# Patient Record
Sex: Male | Born: 1935 | Race: White | Hispanic: No | Marital: Married | Smoking: Former smoker
Health system: Southern US, Community
[De-identification: ages and names within clinical notes are randomized; demographics above are authoritative.]

## PROBLEM LIST (undated history)

## (undated) DIAGNOSIS — M1990 Unspecified osteoarthritis, unspecified site: Secondary | ICD-10-CM

## (undated) DIAGNOSIS — E785 Hyperlipidemia, unspecified: Secondary | ICD-10-CM

## (undated) DIAGNOSIS — Z8719 Personal history of other diseases of the digestive system: Secondary | ICD-10-CM

## (undated) DIAGNOSIS — I2510 Atherosclerotic heart disease of native coronary artery without angina pectoris: Secondary | ICD-10-CM

## (undated) DIAGNOSIS — I739 Peripheral vascular disease, unspecified: Secondary | ICD-10-CM

## (undated) DIAGNOSIS — I10 Essential (primary) hypertension: Secondary | ICD-10-CM

## (undated) DIAGNOSIS — K219 Gastro-esophageal reflux disease without esophagitis: Secondary | ICD-10-CM

## (undated) DIAGNOSIS — I719 Aortic aneurysm of unspecified site, without rupture: Secondary | ICD-10-CM

## (undated) HISTORY — DX: Hyperlipidemia, unspecified: E78.5

## (undated) HISTORY — PX: CHOLECYSTECTOMY: SHX55

## (undated) HISTORY — DX: Atherosclerotic heart disease of native coronary artery without angina pectoris: I25.10

## (undated) HISTORY — DX: Gastro-esophageal reflux disease without esophagitis: K21.9

## (undated) HISTORY — PX: HAND SURGERY: SHX662

## (undated) HISTORY — PX: US EXTREMITY*L*: HXRAD785

## (undated) HISTORY — PX: TONSILLECTOMY: SUR1361

## (undated) HISTORY — DX: Essential (primary) hypertension: I10

---

## 1996-11-09 HISTORY — PX: AORTIC VALVE REPLACEMENT: SHX41

## 1996-11-09 HISTORY — PX: CORONARY ARTERY BYPASS GRAFT: SHX141

## 2004-10-14 ENCOUNTER — Ambulatory Visit: Payer: Self-pay | Admitting: *Deleted

## 2007-01-06 ENCOUNTER — Ambulatory Visit: Payer: Self-pay | Admitting: *Deleted

## 2007-01-07 ENCOUNTER — Ambulatory Visit: Payer: Self-pay

## 2007-02-02 ENCOUNTER — Ambulatory Visit: Payer: Self-pay | Admitting: *Deleted

## 2007-09-26 ENCOUNTER — Ambulatory Visit: Payer: Self-pay

## 2007-09-26 ENCOUNTER — Encounter: Payer: Self-pay | Admitting: Cardiovascular Disease

## 2007-09-26 ENCOUNTER — Ambulatory Visit: Payer: Self-pay | Admitting: Cardiovascular Disease

## 2008-04-13 ENCOUNTER — Ambulatory Visit: Payer: Self-pay | Admitting: Cardiovascular Disease

## 2008-10-15 ENCOUNTER — Ambulatory Visit: Payer: Self-pay | Admitting: Cardiovascular Disease

## 2009-07-01 ENCOUNTER — Ambulatory Visit: Payer: Self-pay | Admitting: Cardiovascular Disease

## 2009-07-01 DIAGNOSIS — E785 Hyperlipidemia, unspecified: Secondary | ICD-10-CM

## 2009-07-01 DIAGNOSIS — Z954 Presence of other heart-valve replacement: Secondary | ICD-10-CM | POA: Insufficient documentation

## 2009-07-01 DIAGNOSIS — I10 Essential (primary) hypertension: Secondary | ICD-10-CM

## 2009-07-08 ENCOUNTER — Telehealth: Payer: Self-pay | Admitting: Cardiovascular Disease

## 2009-07-22 ENCOUNTER — Ambulatory Visit: Payer: Self-pay | Admitting: Cardiovascular Disease

## 2009-07-23 ENCOUNTER — Telehealth: Payer: Self-pay | Admitting: Cardiovascular Disease

## 2009-08-12 ENCOUNTER — Ambulatory Visit: Payer: Self-pay | Admitting: Cardiovascular Disease

## 2009-11-29 DIAGNOSIS — I2510 Atherosclerotic heart disease of native coronary artery without angina pectoris: Secondary | ICD-10-CM

## 2009-12-16 ENCOUNTER — Ambulatory Visit: Payer: Self-pay | Admitting: Cardiovascular Disease

## 2010-06-23 ENCOUNTER — Ambulatory Visit: Payer: Self-pay | Admitting: Cardiovascular Disease

## 2010-12-09 NOTE — Assessment & Plan Note (Signed)
Summary: f42m   Visit Type:  6 months follow up Primary Provider:  Dr Ardyth Gal  CC:  Dizziness- ear problems.  History of Present Illness: Clayton [name] is an 77 year old gentleman who underwent St. Jude aortic valve replacement and multivessel coronary bypass surgery in 1999.  He is maintained on Coumadin for anticoagulation of his prosthetic valve.  He is sedentary, but has no symptoms with his normal daily activities. He denies chest pain, dyspnea, orthopnea, or PND. No edema or palpitations.   He is most bothered by progressively worsening vision. He has nearly lost his vision, and questions whether coumadin is causing this. He has been seen by several opthamologists and does not have much hope for improvement at this point.  Current Medications (verified): 1)  Jantoven 5 Mg Tabs (Warfarin Sodium) .... As Directed 2)  Metoprolol Succinate 100 Mg Xr24h-Tab (Metoprolol Succinate) .... Take One Tablet 1 Am and 1/2 Pm 3)  Aspirin 81 Mg Tbec (Aspirin) .... Take One Tablet By Mouth Daily 4)  Pravastatin Sodium 40 Mg Tabs (Pravastatin Sodium) .... Take One Tablet By Mouth Daily At Bedtime 5)  Lisinopril 10 Mg Tabs (Lisinopril) .... Take 1/4 Tablet Once A Day.,sometimes 1/2 6)  Dorzolamide Hcl 2 % Soln (Dorzolamide Hcl) .... One Drop Both Eyes Two Times A Day 7)  Omeprazole 20 Mg Tbec (Omeprazole) .... Take 1 Tablet By Mouth Once A Day 8)  Eye Vitamins  Caps (Multiple Vitamins-Minerals) .... Take 2 Capsules  By Mouth Two Times A Day  Allergies: 1)  ! * Lock Jaw Shot  Past History:  Past medical history reviewed for relevance to current acute and chronic problems.  Past Medical History: Reviewed history from 11/29/2009 and no changes required. Current Problems:  CAD, ARTERY BYPASS GRAFT (ICD-414.04) HYPERTENSION, UNSPECIFIED (ICD-401.9) HYPERLIPIDEMIA (ICD-272.4) AORTIC VALVE REPLACEMENT, HX OF (ICD-V43.3)  Review of Systems       Negative except as per HPI   Vital Signs:  Patient  profile:   77 year old male Height:      68 inches Weight:      203.25 pounds BMI:     31.02 Pulse rate:   55 / minute Pulse rhythm:   irregular Resp:     18 per minute BP sitting:   130 / 68  (right arm) Cuff size:   large  Vitals Entered By: Vikki Ports (December 16, 2009 11:04 AM)  Physical Exam  General:  Pt is alert and oriented, elderly, obese male, in no acute distress. HEENT: normal Neck: normal carotid upstrokes without bruits, JVP normal Lungs: CTA CV: RRR with normal mechanical A2, soft systolic ejection murmur Abd: soft, NT, positive BS, no bruit, no organomegaly Ext: no clubbing, cyanosis, or edema. peripheral pulses 2+ and equal Skin: warm and dry without rash    EKG  Procedure date:  12/16/2009  Findings:      NSR with first degree AVB, HR 55 bpm  Impression & Recommendations:  Problem # 1:  CAD, ARTERY BYPASS GRAFT (ICD-414.04) Stable without angina. Pt is on low-dose ASA in addition to coumadin, as well as a beta blocker with HR less than 60. Continue current Rx.  His updated medication list for this problem includes:    Jantoven 5 Mg Tabs (Warfarin sodium) .Marland Kitchen... As directed    Metoprolol Succinate 100 Mg Xr24h-tab (Metoprolol succinate) .Marland Kitchen... Take one tablet 1 am and 1/2 pm    Aspirin 81 Mg Tbec (Aspirin) .Marland Kitchen... Take one tablet by mouth daily  Lisinopril 10 Mg Tabs (Lisinopril) .Marland Kitchen... Take 1/4 tablet once a day.,sometimes 1/2  Orders: EKG w/ Interpretation (93000)  Problem # 2:  AORTIC VALVE REPLACEMENT, HX OF (ICD-V43.3) Pt on coumadin. I did a literature review and can't find an association between coumadin and visual loss. Pradaxa is not an alternative since it is not approved for mechanical valves. The pt was agreeable to continuation of coumadin.  Orders: EKG w/ Interpretation (93000)  Patient Instructions: 1)  Your physician recommends that you continue on your current medications as directed. Please refer to the Current Medication list  given to you today. 2)  Your physician wants you to follow-up in: 6 MONTHS.  You will receive a reminder letter in the mail two months in advance. If you don't receive a letter, please call our office to schedule the follow-up appointment.

## 2010-12-09 NOTE — Assessment & Plan Note (Signed)
Summary: Clayton [name]   Visit Type:  6 months follow up Primary Provider:  Dr Ardyth Gal  CC:  No complaints.  History of Present Illness: Clayton [name] is an 77 year old gentleman who underwent St. Jude aortic valve replacement and multivessel coronary bypass surgery in 1999.  He is maintained on Coumadin for anticoagulation of his prosthetic valve.  He is very inactive because of progressive vision loss and he now is legally blind. The patient denies chest pain, dyspnea, orthopnea, PND, edema, palpitations, lightheadedness, or syncope.   Current Medications (verified): 1)  Warfarin Sodium 6 Mg Tabs (Warfarin Sodium) .... Use As Directed By Anticoagulation Clinic 6mg  One A Day and 5mg  One Time A Week 2)  Metoprolol Succinate 100 Mg Xr24h-Tab (Metoprolol Succinate) .... Take One Tablet 1 Am and 1/2 Pm 3)  Aspirin 81 Mg Tbec (Aspirin) .... Take One Tablet By Mouth Daily 4)  Lisinopril 10 Mg Tabs (Lisinopril) .... Take 1/4 Tablet Once A Day.,sometimes 1/2 5)  Dorzolamide Hcl 2 % Soln (Dorzolamide Hcl) .... One Drop Both Eyes Two Times A Day 6)  Omeprazole 20 Mg Tbec (Omeprazole) .... Take 1 Tablet By Mouth Once A Day 7)  Eye Vitamins  Caps (Multiple Vitamins-Minerals) .... Take 2 Capsules  By Mouth Two Times A Day  Allergies: 1)  ! * Lock Jaw Shot  Past History:  Past medical history reviewed for relevance to current acute and chronic problems.  Past Medical History: Current Problems:  CAD, ARTERY BYPASS GRAFT (ICD-414.04) 1999 HYPERTENSION, UNSPECIFIED (ICD-401.9) HYPERLIPIDEMIA (ICD-272.4) AORTIC VALVE REPLACEMENT, HX OF (ICD-V43.3) - St Jude AVR  Past Surgical History: Aortic valve replacement Bypass graft x 3 1999 Gallbladder surgery Tonsillectomy  Review of Systems       Negative except as per HPI   Vital Signs:  Patient profile:   77 year old male Height:      68 inches Weight:      202 pounds BMI:     30.83 Pulse rate:   58 / minute Pulse rhythm:   regular Resp:     18 per  minute BP sitting:   124 / 70  (right arm) Cuff size:   large  Vitals Entered By: Vikki Ports (June 23, 2010 12:19 PM)  Physical Exam  General:  Pt is alert and oriented, elderly, obese male, in no acute distress. HEENT: normal Neck: normal carotid upstrokes without bruits, JVP normal Lungs: CTA CV: RRR with normal mechanical A2, soft systolic ejection murmur Abd: soft, NT, positive BS, no bruit, no organomegaly Ext: no clubbing, cyanosis, or edema. peripheral pulses 2+ and equal Skin: warm and dry without rash    EKG  Procedure date:  06/23/2010  Findings:      Sinus bradycardia 58 bpm with 1st degree AVB, otherwise WNL  Impression & Recommendations:  Problem # 1:  CAD, ARTERY BYPASS GRAFT (ICD-414.04)  Stable without angina. Continue current Rx.  His updated medication list for this problem includes:    Warfarin Sodium 6 Mg Tabs (Warfarin sodium) ..... Use as directed by anticoagulation clinic 6mg  one a day and 5mg  one time a week    Metoprolol Succinate 100 Mg Xr24h-tab (Metoprolol succinate) .Marland Kitchen... Take one tablet 1 am and 1/2 pm    Aspirin 81 Mg Tbec (Aspirin) .Marland Kitchen... Take one tablet by mouth daily    Lisinopril 10 Mg Tabs (Lisinopril) .Marland Kitchen... Take 1/4 tablet once a day.,sometimes 1/2  Orders: EKG w/ Interpretation (93000)  Problem # 2:  AORTIC VALVE REPLACEMENT, HX OF (ICD-V43.3)  Continue anticoagulationa with warfarin. Valve stable by exam.  Orders: EKG w/ Interpretation (93000)  Problem # 3:  HYPERTENSION, UNSPECIFIED (ICD-401.9)  Well-controlled on current Rx.  His updated medication list for this problem includes:    Metoprolol Succinate 100 Mg Xr24h-tab (Metoprolol succinate) .Marland Kitchen... Take one tablet 1 am and 1/2 pm    Aspirin 81 Mg Tbec (Aspirin) .Marland Kitchen... Take one tablet by mouth daily    Lisinopril 10 Mg Tabs (Lisinopril) .Marland Kitchen... Take 1/4 tablet once a day.,sometimes 1/2  BP today: 124/70 Prior BP: 130/68 (12/16/2009)  Orders: EKG w/ Interpretation  (93000)  Problem # 4:  HYPERLIPIDEMIA (ICD-272.4) Lipids followed by PCP. Goal LDL less than 100 mg/dL  The following medications were removed from the medication list:    Pravastatin Sodium 40 Mg Tabs (Pravastatin sodium) .Marland Kitchen... Take one tablet by mouth daily at bedtime  Orders: EKG w/ Interpretation (93000)  Patient Instructions: 1)  Your physician recommends that you continue on your current medications as directed. Please refer to the Current Medication list given to you today. 2)  Your physician wants you to follow-up in:  6 MONTHS. You will receive a reminder letter in the mail two months in advance. If you don't receive a letter, please call our office to schedule the follow-up appointment.

## 2010-12-15 ENCOUNTER — Encounter: Payer: Self-pay | Admitting: Cardiovascular Disease

## 2010-12-15 ENCOUNTER — Ambulatory Visit (INDEPENDENT_AMBULATORY_CARE_PROVIDER_SITE_OTHER): Payer: Medicare Other | Admitting: Cardiovascular Disease

## 2010-12-15 DIAGNOSIS — R0989 Other specified symptoms and signs involving the circulatory and respiratory systems: Secondary | ICD-10-CM

## 2010-12-15 DIAGNOSIS — I359 Nonrheumatic aortic valve disorder, unspecified: Secondary | ICD-10-CM

## 2010-12-15 DIAGNOSIS — I2510 Atherosclerotic heart disease of native coronary artery without angina pectoris: Secondary | ICD-10-CM

## 2010-12-22 ENCOUNTER — Encounter: Payer: Self-pay | Admitting: Cardiovascular Disease

## 2010-12-25 NOTE — Assessment & Plan Note (Signed)
Summary: Z6X    Visit Type:  6 months follow up Primary Provider:  Dr Shary Decamp  CC:  no complaints.  History of Present Illness: Clayton [name] is an 77 year old gentleman who underwent St. Jude aortic valve replacement and multivessel coronary bypass surgery in 1999.  He is maintained on Coumadin for anticoagulation of his prosthetic valve.  He is very inactive because of progressive vision loss and he now is legally blind.   The patient is doing well at present. He denies chest pain, dyspnea, or edema. He has been compliant with his medical regimen.    Current Medications (verified): 1)  Warfarin Sodium 6 Mg Tabs (Warfarin Sodium) .... Use As Directed By Anticoagulation Clinic 6mg  One A Day and 5mg  One Time A Week 2)  Metoprolol Succinate 100 Mg Xr24h-Tab (Metoprolol Succinate) .... Take One Tablet 1 Am and 1/2 Pm 3)  Aspirin 81 Mg Tbec (Aspirin) .... Take One Tablet By Mouth Daily 4)  Lisinopril 10 Mg Tabs (Lisinopril) .... Take 1 Tablet By Mouth Once A Day 5)  Dorzolamide Hcl 2 % Soln (Dorzolamide Hcl) .... One Drop Both Eyes Two Times A Day 6)  Omeprazole 20 Mg Tbec (Omeprazole) .... Take 1 Tablet By Mouth Once A Day 7)  Eye Vitamins  Caps (Multiple Vitamins-Minerals) .... Take 2 Capsules  By Mouth Two Times A Day  Allergies: 1)  ! * Lock Jaw Shot  Past History:  Past medical history reviewed for relevance to current acute and chronic problems.  Past Medical History: Reviewed history from 06/23/2010 and no changes required. Current Problems:  CAD, ARTERY BYPASS GRAFT (ICD-414.04) 1999 HYPERTENSION, UNSPECIFIED (ICD-401.9) HYPERLIPIDEMIA (ICD-272.4) AORTIC VALVE REPLACEMENT, HX OF (ICD-V43.3) - St Jude AVR  Review of Systems       Negative except as per HPI   Vital Signs:  Patient profile:   77 year old male Height:      68 inches Weight:      202.50 pounds BMI:     30.90 Pulse rate:   64 / minute Pulse rhythm:   irregular Resp:     18 per minute BP sitting:   164 /  80  (right arm) Cuff size:   large  Vitals Entered By: Vikki Ports (December 15, 2010 2:10 PM)  Physical Exam  General:  Pt is alert and oriented, elderly, obese male, in no acute distress. HEENT: normal Neck: normal carotid upstrokes without bruits, JVP normal Lungs: CTA CV: RRR with normal mechanical A2, soft systolic ejection murmur Abd: soft, NT, positive BS, no bruit, no organomegaly Ext: no clubbing, cyanosis, or edema. peripheral pulses 2+ and equal Skin: warm and dry without rash    EKG  Procedure date:  12/15/2010  Findings:      NSR 64 bpm, nonspecific ST abnormality.  Impression & Recommendations:  Problem # 1:  AORTIC VALVE DISORDERS (ICD-424.1) Pt stable following St Jude mechanical AVR. His exam is unchanged with crisp mechanical heart sounds. Continue with 6 month followup.  His updated medication list for this problem includes:    Metoprolol Succinate 100 Mg Xr24h-tab (Metoprolol succinate) .Marland Kitchen... Take one tablet 1 am and 1/2 pm    Lisinopril 10 Mg Tabs (Lisinopril) .Marland Kitchen... Take 1 tablet by mouth once a day  Orders: EKG w/ Interpretation (93000)  Problem # 2:  CAD, ARTERY BYPASS GRAFT (ICD-414.04) No angina.   His updated medication list for this problem includes:    Warfarin Sodium 6 Mg Tabs (Warfarin sodium) ..... Use as directed  by anticoagulation clinic 6mg  one a day and 5mg  one time a week    Metoprolol Succinate 100 Mg Xr24h-tab (Metoprolol succinate) .Marland Kitchen... Take one tablet 1 am and 1/2 pm    Aspirin 81 Mg Tbec (Aspirin) .Marland Kitchen... Take one tablet by mouth daily    Lisinopril 10 Mg Tabs (Lisinopril) .Marland Kitchen... Take 1 tablet by mouth once a day  Orders: EKG w/ Interpretation (93000)  Problem # 3:  HYPERTENSION, UNSPECIFIED (ICD-401.9) Repeat BP on my check is 140/70 and past readings have been within normal limits. Will watch for now without escalation of antihypertensive Rx.  His updated medication list for this problem includes:    Metoprolol Succinate  100 Mg Xr24h-tab (Metoprolol succinate) .Marland Kitchen... Take one tablet 1 am and 1/2 pm    Aspirin 81 Mg Tbec (Aspirin) .Marland Kitchen... Take one tablet by mouth daily    Lisinopril 10 Mg Tabs (Lisinopril) .Marland Kitchen... Take 1 tablet by mouth once a day  Problem # 4:  HYPERLIPIDEMIA (ICD-272.4) Followed by Dr Shary Decamp. Pt is statin-intolerant.  Patient Instructions: 1)  Your physician recommends that you continue on your current medications as directed. Please refer to the Current Medication list given to you today. 2)  Your physician wants you to follow-up in: 6 MONTHS.  You will receive a reminder letter in the mail two months in advance. If you don't receive a letter, please call our office to schedule the follow-up appointment.

## 2011-03-24 NOTE — Assessment & Plan Note (Signed)
[facility]                            CARDIOLOGY OFFICE NOTE   NAME:[name], [name]                           MRN:          [id number]  DATE:04/13/2008                            DOB:          Jul 25, 1926    Clayton [name] was seen in follow up at the [facility] on April 13, 2008.  Clayton [name] is a 77 year old gentleman with a history of severe  aortic stenosis and coronary artery disease.  He underwent St. Jude  aortic valve replacement and coronary artery bypass surgery in 1999.  He  is maintained on Coumadin for anticoagulation of his prosthetic valve.  From a symptomatic standpoint, he is doing well.  He denies chest pain,  dyspnea, orthopnea, PND, or lower extremity edema.  He has no  palpitations, lightheadedness, or syncope.   He underwent an adenosine Myoview stress scan in February 2008 that was  low risk with no significant ischemia.  His last echocardiogram was in  November 2008 and demonstrated preserved LV systolic function with an  LVEF of 65% and a normal functioning aortic valve prosthesis with a mean  transaortic gradient of 7 mmHg.  Doppler studies were suggestive of  diastolic dysfunction.   CURRENT MEDICATIONS:  1. Jantoven as directed.  2. Metoprolol 50 mg twice daily.  3. Aspirin 81 mg daily.  4. Travatan drops daily.  5. Omeprazole 20 mg daily.  6. __________Coumadin as directed.  7. Lutein 20 mg daily.  8. Aspirin 81 mg daily.  9. Prilosec OTC as needed.   PHYSICAL EXAMINATION:  The patient is an alert, oriented, and elderly  male in no acute distress.  Weight is 207, blood pressure 134/74, heart rate 69, and respiratory  rate 16.  HEENT:  Normal.  NECK:  Normal carotid upstrokes without bruits.  Jugular venous pressure  is normal.  LUNGS:  Clear bilaterally.  HEART:  Regular rate and rhythm with a soft systolic ejection murmur  along the left sternal border.  Normal mechanical aortic valve sounds.  No diastolic  murmurs.  ABDOMEN:  Soft, nontender.  No organomegaly.  The abdomen is obese.  EXTREMITIES:  No clubbing, cyanosis, or edema.  Peripheral pulses 2+ and  equal throughout.  SKIN:  Warm and dry without rash.   EKG shows normal sinus rhythm with first-degree AV block and a PR  interval of 238 milliseconds.  No significant ST change.   ASSESSMENT:  1. Coronary artery disease.  Continue secondary risk reduction      measures.  No evidence of angina.  Nonischemic Myoview last year as      noted.  The patient is intolerant to all STATINS and CHOLESTEROL-      LOWERING AGENTS.  2. Status post aortic valve replacement, normal functioning St. Jude      prosthesis.  Continue anticoagulation with Coumadin.  3. Hypertension.  Blood pressure under excellent control.   For follow up, I would like to see the patient back in 6 months.     Veverly Fells. Excell Seltzer, MD  Electronically Signed    MDC/MedQ  DD: 04/23/2008  DT: 04/23/2008  Job #: [id number]   cc:   Feliciana Rossetti, MD

## 2011-03-24 NOTE — Assessment & Plan Note (Signed)
[facility]                            CARDIOLOGY OFFICE NOTE   NAME:Clayton [name]                           MRN:          [id number]  DATE:09/26/2007                            DOB:          Jun 13, 1926    REASON FOR VISIT:  Clayton [name] presents for follow up at the [facility]  Cardiology Office on September 26, 2007.  Clayton [name] is a long-time patient  of Dr. Gabriel Rung [place].  I will be assuming his cardiac care from here  forward.   HISTORY:  Clayton [name] is a very nice 77 year old gentleman who has a  history of coronary artery disease and severe aortic stenosis.  He  underwent St. Jude aortic valve replacement and coronary artery bypass  surgery in 1999.  Prior to his surgery, he was limited by severe  dyspnea.  He has done quite well over the years with respect to his  cardiovascular disease.  Clayton [name] complains of occasional chest  discomfort, but he does not have any consistent exertional symptoms.  He  has mild exertional dyspnea, but he is currently not limited by any  cardiac symptoms.  He specifically denies edema, orthopnea, PND,  lightheadedness, palpitations or syncope.   He underwent an adenosine Myoview study in February of this year that  was a low-risk study showing only inferoseptal thinning, but no  significant ischemia.  He had an echocardiogram performed just before  his office visit today that showed normal left ventricular systolic  function with an EF of 65%.  There were findings consistent with  diastolic dysfunction.  A mechanical St. Jude valve has normal function  with a mean transaortic valve gradient of 7 mmHg.   CURRENT MEDICATIONS:  1. Jantoven as directed.  2. Metoprolol 50 mg b.i.d.  3. Aspirin 81 mg daily.  4. Eye drops.   PHYSICAL EXAMINATION:  GENERAL:  The patient is an elderly male in no  acute distress.  VITAL SIGNS:  Weight 206, blood pressure 128/70, heart rate 67, regular  rate and rhythm 16.  HEENT:  Normal.  NECK:  Normal.  Carotid upstrokes without bruits.  Jugular venous  pressure is normal.  LUNGS:  Clear to auscultation bilaterally.  HEART:  Regular rate and rhythm with a soft systolic ejection murmur  along the left sternal border.  The mechanical second heart sound is  normal.  There are no diastolic murmurs.  ABDOMEN:  Soft, nontender.  No organomegaly, obese.  EXTREMITIES:  No clubbing, cyanosis, or edema.  Peripheral pulses are 2+  and equal throughout.  SKIN:  Warm and dry without rash.   DIAGNOSTICS:  EKG shows normal sinus rhythm and is within normal limits.   ASSESSMENT:  1. Mechanical aortic valve replacement.  The patient's valve function      appears normal.  His exam is consistent with a normally functioning      St. Jude prosthesis.  Continue Warfarin for anticoagulation.      Monitoring is performed by Dr. Anders Grant office.  2. Coronary artery disease, status post coronary artery bypass graft.  Continue secondary risk reduction measures.  The patient has been      intolerant to all cholesterol-lowering medications.  I will defer      to Dr. Shary Decamp as I am not sure which specific medications have been      tried, but he does not appear to tolerate statins.  3. Hypertension.  Blood pressure is under ideal control on metoprolol.   FOLLOW UP:  I would like to see Clayton [name] back in 6 months for his  regular followup.     Veverly Fells. Excell Seltzer, MD  Electronically Signed    MDC/MedQ  DD: 09/26/2007  DT: 09/27/2007  Job #: [id number]   cc:   Feliciana Rossetti, MD

## 2011-03-24 NOTE — Assessment & Plan Note (Signed)
[facility]                        [facility] OFFICE NOTE   NAME:Clayton [name], Clayton [name]                           MRN:          [id number]  DATE:07/22/2009                            DOB:          [birth year]    HISTORY OF PRESENT ILLNESS:  Clayton [name] is an 77 year old white male with  a history of coronary artery disease, status post bypass surgery in 1999  with a concomitant St. Jude aortic valve replacement who is presenting  with an episode of dizziness yesterday.  The patient states that  yesterday immediately after waking from his nap, he felt lightheaded.  He denies any frank vertigo.  The patient tried to stand to his feet  with the help of his wife, but felt too dizzy to do so, so he sat back  down.  The entire episode lasted several minutes and resolved by itself.  He denies any syncope, associated chest discomfort, although he did  state he had a feeling that was similar to his heartburn as he did not  take his heartburn medicine.  The patient endorses no prior history of  dizziness nor has it returned.  The patient and his wife states that  ever since then he has been in his normal state of health.  He has not  had any weakness or numbness in his limbs and he has been oriented to a  normal degree.  Of note, the patient has not had his INR checked in a  couple weeks, but there has been no recent need to change his Coumadin  dosing based on prior INRs.  Of note, during the day yesterday the  patient's wife took his blood pressure and it ranged from 139/72 to  202/90.  This morning the patient states well at home, blood pressure  was 125/59.   MEDICATION LIST:  1. Aspirin 81 mg daily.  2. Metoprolol 100 mg every morning and 50 mg every evening.  3. Pravastatin 40 mg every evening.  4. Lisinopril 2.5 mg daily.  5. Omeprazole 20 mg daily.  6. Dorzolamide eye drops.  7. Travatan eye drops.  8. Coumadin 5 mg daily.   PHYSICAL EXAMINATION:  VITAL  SIGNS:  Blood pressure 124/75, pulse 59,  sating 96% on room air.  GENERAL:  He is in no acute distress.  HEENT:  Cranial nerves II through XII are intact.  Extraocular movements  are intact.  Normocephalic, atraumatic.  NECK:  Supple.  There is no JVD is normal.  There is carotid upstrokes  bilaterally.  HEART:  Regular rate and rhythm with an aortic valve closure click.  There is no murmur, rub, or gallop that is auscultated, however the  heart sounds are bit distant.  LUNGS:  Clear bilaterally.  ABDOMEN:  Soft, nontender, nondistended.  EXTREMITIES:  Without edema.  MUSCULOSKELETAL:  The patient has 5/5 bilateral upper and lower  extremity strength.  NEUROLOGIC:  Nonfocal.  The patient does have chronic issues with his  left hand and wrist.   EKG independently reviewed by myself demonstrates normal sinus rhythm  with first-degree AV block  with PR interval of 228 milliseconds.  There  is some baseline artifact, but no ST or T-wave abnormalities that are  concerning for ischemia.   ASSESSMENT:  An 77 year old white male with known coronary artery  disease, status post bypass surgery approximately 10 years ago and St.  Jude aortic valve replacement, presenting with one self-limiting episode  of dizziness.  The differential diagnosis for the dizziness is broad and  includes arrhythmia, neurologic event (possibly secondary to prosthetic  aortic valve), orthostasis.   PLAN:  The patient wished to take at least aggressive approach that is  indicated at this time.  We will check a BMP, CBC, and INR.  We will  also check a transthoracic echocardiogram as he has not had his aortic  valvular function or ejection fraction evaluated in approximately 2  years' time.  The patient is instructed to contact our office if the  symptoms reoccur.  At that time, we will consider further workup with a  head CT scan, Holter monitoring and possible stress test.  It should be  noted that he does have a  first-degree AV block on his EKG today,  however, unfortunately there are no old EKGs to compare it to at this  time.  He should continue on current medications as listed above for  now, but contact our office if his blood pressures were to become  elevated again.  His wife will check blood pressure twice daily for the  next week.  We will see the patient back in clinic in 3 weeks' time to  follow up.     Brayton El, MD  Electronically Signed    SGA/MedQ  DD: 07/22/2009  DT: 07/23/2009  Job #: [id number]

## 2011-03-24 NOTE — Assessment & Plan Note (Signed)
[facility]                            CARDIOLOGY OFFICE NOTE   NAME:Clayton [name], Clayton [name]                           MRN:          [id number]  DATE:10/15/2008                            DOB:          April 23, 1926    REASON FOR VISIT:  Followup CAD and aortic stenosis.   Clayton [name] is an 77 year old gentleman who underwent St. Jude aortic valve  replacement and multivessel coronary bypass surgery in 1999.  He is  maintained on Coumadin for anticoagulation of his prosthetic valve.  He  is doing reasonably well from a cardiac standpoint and has no symptoms  of chest pain or pressure.  He denies lightheadedness, syncope, or  palpitations.  He has had no lower extremity edema.  He has mild  exertional dyspnea, but he and his wife think this is due to his  sedentary lifestyle.  Unfortunately, he has developed progressive vision  loss due to glaucoma and macular degeneration and this is limiting his  ability to be active.  He is no longer driving.   MEDICATIONS:  1. Jantoven as directed.  2. Metoprolol 100 mg in the morning and 50 mg in the evening.  3. Omeprazole 20 mg daily.  4. Travatan eye drops.  5. Aspirin 81 mg daily.   PHYSICAL EXAMINATION:  GENERAL:  The patient is an elderly gentleman in  no acute distress.  VITAL SIGNS:  Weight is 206 pounds, blood pressure 137/72, heart rate is  58, respiratory rate is 18.  HEENT:  Normal.  NECK:  Normal carotid upstrokes.  No bruits.  JVP normal.  LUNGS:  Clear bilaterally.  HEART:  Regular rate and rhythm with systolic ejection murmur along the  left sternal border and normal mechanical aortic valve sounds.  There  are no diastolic murmurs.  LUNGS:  Clear.  ABDOMEN:  Soft, obese, nontender, no organomegaly.  EXTREMITIES:  No clubbing, cyanosis, or edema.  Pedal pulses are intact  and equal.  SKIN:  Warm and dry without rash.   EKG shows sinus rhythm with first-degree AV block and was otherwise  within normal  limits.   ASSESSMENT:  1. Status post St. Jude AVR.  Continue anticoagulation with warfarin.      The patient remains asymptomatic and his exam suggests of normal      function of his mechanical valve.  His last echo was in November      2008, and showed findings consistent with diastolic dysfunction, as      well as a normal functioning mechanical aortic valve prosthesis.      The mean transaortic valve gradient was 7 mmHg.  2. His coronary artery disease.  No symptoms of angina.  Continue beta-      blocker and low-dose aspirin along with his Coumadin.  3. Hyperlipidemia.  The patient is intolerant to all STATINS and      CHOLESTEROL LOWERING AGENTS.  Continue observation.  4. He has some followup.  I would like to see Clayton [name] back in 6      months for followup.  Veverly Fells. Excell Seltzer, MD  Electronically Signed    MDC/MedQ  DD: 10/15/2008  DT: 10/16/2008  Job #: [id number]   cc:   Windy Fast L. Earlene Plater, M.D.  Feliciana Rossetti, MD

## 2011-03-24 NOTE — Assessment & Plan Note (Signed)
[facility]                        [facility] OFFICE NOTE   NAME:Clayton [name], Clayton [name]                           MRN:          [id number]  DATE:08/12/2009                            DOB:          1926/09/12    PROBLEM LIST:  1. Coronary artery disease, status post coronary artery bypass graft      in 1999 with a concomitant St. Jude aortic valve.  2. Hyperlipidemia.  3. Hypertension.   INTERVAL HISTORY:  The patient states he has done well since his last  visit.  He did have one episode that was similar to an episode he  experienced before his last visit.  He states that he was outside when  he heard the phone ringing.  He exert himself to have a degree to get  inside and answered the phone, at that point, he walked around for  little while and had an acute episode of nausea, dizziness, and he threw  up one time.  The episode resolved on its own.  The wife states she  thinks that he was hypertensive during this episode with a systolic  pressures as high as 200.  In the prior episode approximately 3 weeks  ago, it was not after exertion, but instead at rest in the recumbent  position.  The patient denies any episodes of chest discomfort.  He also  denies any syncopal episodes or lower extremity edema.   PHYSICAL EXAMINATION:  VITAL SIGNS:  The patient has a blood pressure  120/69, weight is 199 pounds which is approximately same as it was  several weeks ago, his pulse is 61, sating 95% on room air.  GENERAL:  No acute distress.  HEART:  Regular rate and rhythm without murmur, rub, or gallop.  There  is a closing snap consistent with aortic valve prosthesis.  LUNGS:  Clear bilaterally.  ABDOMEN:  Soft, nontender, nondistended.  EXTREMITIES:  Without edema.   Review of the patient's blood pressure log which she brings in today  shows blood pressures ranging from 120/59 to 144/70, however, most of  these blood pressures have systolics in the 130s and  diastolics in the  60s.   ASSESSMENT AND PLAN:  The patient is not exhibiting any signs or  symptoms that are clearly related to angina.  We spoke regarding the  symptomatic episode that he had at home and that this could potentially  represent angina.  The patient is not interested in getting a stress  test at this point, but states he will contact our office if it happens  again or if he were to develop any chest discomfort.  His blood pressure  is under good control and he is currently on Coumadin with INRs  monitored by his primary care physician for the aortic valve  replacement.  Of note, I have personally reviewed the patient's  echocardiogram which is a poor quality study  that shows an ejection fraction of 60% and prosthetic aortic valve  appears to be functioning  adequately.  We will see the patient back in several months' time and he  was told to contact our office if he develops any chest discomfort or if  the episodes of dizziness and nausea return.     Brayton El, MD  Electronically Signed    SGA/MedQ  DD: 08/12/2009  DT: 08/13/2009  Job #: [id number]

## 2011-03-27 NOTE — Letter (Signed)
February 02, 2007    Feliciana Rossetti, MD  [address]  [place], [place] [address]   RE:  Clayton [name], Clayton [name]  MRN:  [id number]  /  DOB:  07/18/1926   Dear Tammy Sours:   It was a pleasure to see your patient, Clayton [name], for followup on February 02, 2007.  As you know, he is a very pleasant 77 year old white male  with coronary artery disease and aortic stenosis, now 9 years post CABG  and aortic valve replacement with a St. Jude prosthesis.  He also has  hypertension, macular degeneration, GERD, and previous cholecystectomy.  Patient continues to get along quite well.  When he was seen a month  ago, he was hypertensive, and I suggested Norvasc, and also I suggested  simvastatin.   He has not started either one because he has not tolerated statins in  the past.   He had a stress test revealing EF of 72% and no schemia.   MEDICATIONS:  Include:  1. Coumadin.  2. Aspirin 81.  3. Prilosec.  4. Jantovin.  5. Metoprolol 100.   PHYSICAL EXAMINATION:  Blood pressure 141/74.  Pulse 64.  Normal sinus  rhythm.  General appearance normal. JVP is not elevated.  Carotid pulses are  palpable and equal without bruits.  LUNGS:  Clear.  CARDIAC EXAM:  Reveals normal valve sounds.  No significant murmur.  ABDOMINAL EXAM:  Normal.  EXTREMITIES:  Normal.   DIAGNOSIS:  As above.  Patient appears to be stable.  I suggested he  continue on the same therapy.  I will have him return in 6 months, at  which time he will see Dr. Excell Seltzer, or one of my other associates.  He  should have a followup 2D echo at that time.   Thank you for the opportunity to share in this nice gentleman's care.    Sincerely,      E. Graceann Congress, MD, [facility]  Electronically Signed    EJL/MedQ  DD: 02/02/2007  DT: 02/02/2007  Job #: [id number]

## 2011-03-27 NOTE — Letter (Signed)
January 06, 2007    Feliciana Rossetti, MD  [address]  [place], [place] [address]   RE:  Clayton [name], Clayton [name]  MRN:  [id number]  /  DOB:  May 03, 1926   Dear Tammy Sours:   It is a pleasure to see your nice patient, Deloris Mittag, for followup on  January 06, 2007.  As you know, Mr. Wieczorek is a very pleasant 78 year old  white married male with coronary artery disease and aortic stenosis, now  9 years post CABG and aortic valve replacement with St. Jude prosthesis.   He also has hypertension, macular degeneration, GERD, and previous  cholecystectomy.   The patient states that generally he has been doing well.  Yesterday, he  noted some indigestion-type discomfort with walking in the mall.  This  is the first time he has attempted to walk for a couple of months.  He  states that, after a few times he walked around the whole mall without  any symptoms.  He went home and took something for heartburn and has  had no recurrence.   He had an abdominal ultrasound since his last visit, which revealed no  aneurysm.  At the time of surgery, the patient had anastomosis of the  left internal mammary artery to the LAD, saphenous vein graft to the  RCA, and saphenous vein graft to the diagonal.   MEDICATIONS:  1. Coumadin.  2. Zinc.  3. Lutein.  4. Aspirin 81.  5. Alphagan.  6. Metoprolol 100 daily.   He is no longer on a statin.  Had previously been on Lipitor.   PHYSICAL EXAM:  Blood pressure is 160/80, pulse 61, normal sinus rhythm.  GENERAL APPEARANCE:  Normal.  JVPs not elevated.  Carotid pulses palpable and equal without bruits.  LUNGS:  Clear.  CARDIAC:  Exam reveals normal valve sounds with 1/6 short systolic  murmur.  EXTREMITIES:  No edema.   ELECTROCARDIOGRAM:  Normal sinus rhythm with minor T abnormality in aVL.  This has also been seen previously.   IMPRESSION:  1. Nine years post coronary artery bypass grafting and aortic valve      replacement with St. Jude prosthesis.  2.  Hyperlipidemia.  On no therapy.  3. Gastroesophageal reflux disease.  4. I am primarily concerned about the recent chest discomfort.  He      also has hypertension.   I have suggested a stress Myoview.  If he has recurrence of his symptoms  I suggest he call promptly and report to the emergency room.  In  addition, I plan to add simvastatin 40 and Norvasc 5.  I plan to see him  in 3 or 4 weeks or p.r.n.  Thank you for the opportunity to share in  this nice gentleman's care.    Sincerely,      E. Graceann Congress, MD, [facility]  Electronically Signed    EJL/MedQ  DD: 01/06/2007  DT: 01/06/2007  Job #: [id number]

## 2011-07-16 ENCOUNTER — Encounter: Payer: Self-pay | Admitting: Cardiovascular Disease

## 2011-07-27 ENCOUNTER — Encounter: Payer: Self-pay | Admitting: Cardiovascular Disease

## 2011-07-27 ENCOUNTER — Ambulatory Visit (INDEPENDENT_AMBULATORY_CARE_PROVIDER_SITE_OTHER): Payer: Medicare Other | Admitting: Cardiovascular Disease

## 2011-07-27 DIAGNOSIS — I10 Essential (primary) hypertension: Secondary | ICD-10-CM

## 2011-07-27 DIAGNOSIS — E785 Hyperlipidemia, unspecified: Secondary | ICD-10-CM

## 2011-07-27 DIAGNOSIS — I25810 Atherosclerosis of coronary artery bypass graft(s) without angina pectoris: Secondary | ICD-10-CM

## 2011-07-27 DIAGNOSIS — I359 Nonrheumatic aortic valve disorder, unspecified: Secondary | ICD-10-CM

## 2011-07-27 NOTE — Patient Instructions (Signed)
Your physician wants you to follow-up in: 6 MONTHS You will receive a reminder letter in the mail two months in advance. If you don't receive a letter, please call our office to schedule the follow-up appointment. 

## 2011-07-27 NOTE — Assessment & Plan Note (Signed)
Blood pressure is controlled. He had an elevated reading at his appointment 6 months ago but this appears to be isolated.

## 2011-07-27 NOTE — Assessment & Plan Note (Signed)
Stable without angina 

## 2011-07-27 NOTE — Assessment & Plan Note (Signed)
I have reviewed lipids from Dr. Anders Grant office and his total cholesterol was 200 with an LDL of 140. He has knee pain that he attributes to pravastatin and he has discontinued this medication on his own. He will not agree to take any other statin drugs.

## 2011-07-27 NOTE — Assessment & Plan Note (Signed)
Physical exam is stable with crisp mechanical aortic closure sound. The patient remained stable on warfarin. I will see him back in 6 months for followup.

## 2011-07-27 NOTE — Progress Notes (Signed)
HPI:  77 year old gentleman presenting for follow up evaluation. He has a history of aortic stenosis and is status post St. Jude AVR in 1999. He underwent concomitant CABG at that time.  The patient has no cardiac complaints today. He denies chest pain or pressure. He denies dyspnea, edema, palpitations, lightheadedness, or syncope. His wife notices that he appears short of breath at times, even at rest. The patient denies self perception of shortness of breath.  Outpatient Encounter Prescriptions as of 07/27/2011  Medication Sig Dispense Refill  . aspirin 81 MG tablet Take 81 mg by mouth daily.        . dorzolamide (TRUSOPT) 2 % ophthalmic solution Place 1 drop into both eyes 2 (two) times daily at 10 AM and 5 PM.        . lisinopril (PRINIVIL,ZESTRIL) 10 MG tablet Take 10 mg by mouth daily.        . meclizine (ANTIVERT) 12.5 MG tablet Take 12.5 mg by mouth 3 (three) times daily as needed.        . metoprolol (LOPRESSOR) 100 MG tablet Take 100 mg by mouth. Take 1 tablet AM and 1/2 PM       . multivitamin-lutein (OCUVITE-LUTEIN) CAPS Take 1 capsule by mouth 2 (two) times daily.        Marland Kitchen omeprazole (PRILOSEC) 20 MG capsule Take 20 mg by mouth daily.        . Tamsulosin HCl (FLOMAX) 0.4 MG CAPS Take 0.4 mg by mouth daily.        . travoprost, benzalkonium, (TRAVATAN) 0.004 % ophthalmic solution Place 1 drop into both eyes at bedtime.        Marland Kitchen warfarin (COUMADIN) 5 MG tablet Take 5 mg by mouth daily.        Marland Kitchen DISCONTD: metoprolol (TOPROL-XL) 100 MG 24 hr tablet 1 tab in the AM and 1/2 tab in the PM       . DISCONTD: lisinopril (PRINIVIL,ZESTRIL) 10 MG tablet Take 2.5 mg by mouth daily.        Marland Kitchen DISCONTD: pravastatin (PRAVACHOL) 40 MG tablet Take 40 mg by mouth daily.          Allergies  Allergen Reactions  . Tetanus Toxoids     Past Medical History  Diagnosis Date  . CAD (coronary artery disease)     s/p CABG 1999  . HLD (hyperlipidemia)   . HTN (hypertension)     ROS: positive for  bilateral vision loss, otherwise negative except as per HPI  BP 136/70  Pulse 57  Resp 18  Ht 5\' 8"  (1.727 m)  Wt 194 lb 12.8 oz (88.361 kg)  BMI 29.62 kg/m2  PHYSICAL EXAM: Pt is alert and oriented, elderly male in NAD HEENT: normal Neck: JVP - normal, carotids 2+= without bruits Lungs: CTA bilaterally CV: RRR without murmur or gallop Abd: soft, NT, Positive BS, obese Ext: no C/C/E, distal pulses intact and equal Skin: warm/dry no rash  EKG:  Sinus bradycardia with first degree AV block 57 beats per minute, otherwise within normal limits.  ASSESSMENT AND PLAN:

## 2011-07-31 ENCOUNTER — Encounter: Payer: Self-pay | Admitting: Cardiovascular Disease

## 2011-09-07 ENCOUNTER — Encounter: Payer: Self-pay | Admitting: Cardiovascular Disease

## 2012-02-01 ENCOUNTER — Other Ambulatory Visit: Payer: Self-pay | Admitting: *Deleted

## 2012-02-01 ENCOUNTER — Telehealth: Payer: Self-pay | Admitting: Cardiovascular Disease

## 2012-02-01 MED ORDER — AMOXICILLIN 500 MG PO CAPS: ORAL_CAPSULE | ORAL | Status: DC

## 2012-02-01 NOTE — Telephone Encounter (Signed)
Amoxicillin 2gm 1 hour prior to procedure per Dr. Clifton James.

## 2012-02-01 NOTE — Telephone Encounter (Signed)
[phone], pt having a dental appt, has abcess, does he needs pre med? If so what does he need ? Pt' wife , needs call before 1p cell [phone] , [facility]

## 2012-02-08 ENCOUNTER — Ambulatory Visit: Payer: Medicare Other | Admitting: Cardiovascular Disease

## 2012-02-29 ENCOUNTER — Encounter: Payer: Self-pay | Admitting: Cardiovascular Disease

## 2012-02-29 ENCOUNTER — Ambulatory Visit (INDEPENDENT_AMBULATORY_CARE_PROVIDER_SITE_OTHER): Payer: Medicare Other | Admitting: Cardiovascular Disease

## 2012-02-29 VITALS — BP 130/74 | HR 62 | Ht 68.0 in | Wt 196.8 lb

## 2012-02-29 DIAGNOSIS — I25810 Atherosclerosis of coronary artery bypass graft(s) without angina pectoris: Secondary | ICD-10-CM

## 2012-02-29 DIAGNOSIS — I359 Nonrheumatic aortic valve disorder, unspecified: Secondary | ICD-10-CM

## 2012-02-29 DIAGNOSIS — I10 Essential (primary) hypertension: Secondary | ICD-10-CM

## 2012-02-29 DIAGNOSIS — E785 Hyperlipidemia, unspecified: Secondary | ICD-10-CM

## 2012-02-29 NOTE — Assessment & Plan Note (Signed)
The patient is statin intolerant. He has had intolerance because of severe bilateral knee pain and discontinued pravastatin on his own prior to his last office visit here.

## 2012-02-29 NOTE — Patient Instructions (Signed)
Your physician wants you to follow-up in: 6 MONTHS.  You will receive a reminder letter in the mail two months in advance. If you don't receive a letter, please call our office to schedule the follow-up appointment.  Your physician recommends that you continue on your current medications as directed. Please refer to the Current Medication list given to you today.  

## 2012-02-29 NOTE — Assessment & Plan Note (Signed)
The patient is stable following remote mechanical aVR with a St. Jude aortic valve prosthesis. He is tolerating the combination of warfarin and aspirin and will continue with the same. He is having no cardiac symptoms at the present time.

## 2012-02-29 NOTE — Assessment & Plan Note (Signed)
Blood pressure is well controlled on lisinopril and metoprolol.

## 2012-02-29 NOTE — Assessment & Plan Note (Signed)
No angina. Continue current medical program. Followup in 6 months.

## 2012-02-29 NOTE — Progress Notes (Signed)
   HPI:  78 year old gentleman presenting for followup evaluation. The patient has aortic stenosis status post St. Jude aVR in 1999. He underwent concomitant CABG at that time. He presents today for followup of these problems.  The patient's main problems are related to his vision loss. He can no longer drive and it is more difficult for him to get around. He denies chest pain, chest pressure, dyspnea, edema, palpitations, lightheadedness, orthopnea, PND, or syncope. He takes his medicines as prescribed. He he attends a senior center a regular basis and does some light exercises there.  Outpatient Encounter Prescriptions as of 02/29/2012  Medication Sig Dispense Refill  . amoxicillin (AMOXIL) 500 MG capsule Take 4 tabs 1 hour prior to dental procedure.  4 capsule  0  . aspirin 81 MG tablet Take 81 mg by mouth daily.        . dorzolamide (TRUSOPT) 2 % ophthalmic solution Place 1 drop into both eyes 2 (two) times daily at 10 AM and 5 PM.        . lisinopril (PRINIVIL,ZESTRIL) 10 MG tablet Take 10 mg by mouth daily.        . metoprolol (LOPRESSOR) 100 MG tablet Take 100 mg by mouth daily.       . multivitamin-lutein (OCUVITE-LUTEIN) CAPS Take 1 capsule by mouth 2 (two) times daily.        . NON FORMULARY daily. Slippery elm bark daily      . omeprazole (PRILOSEC) 20 MG capsule Take 20 mg by mouth daily.        . Tamsulosin HCl (FLOMAX) 0.4 MG CAPS Take 0.4 mg by mouth daily.        . travoprost, benzalkonium, (TRAVATAN) 0.004 % ophthalmic solution Place 1 drop into both eyes at bedtime.        Marland Kitchen warfarin (COUMADIN) 5 MG tablet Take 5 mg by mouth daily.        Marland Kitchen DISCONTD: meclizine (ANTIVERT) 12.5 MG tablet Take 12.5 mg by mouth 3 (three) times daily as needed.          Allergies  Allergen Reactions  . Tetanus Toxoids     Past Medical History  Diagnosis Date  . CAD (coronary artery disease)     s/p CABG 1999  . HLD (hyperlipidemia)   . HTN (hypertension)     ROS: Negative except as per  HPI  BP 130/74  Pulse 62  Ht 5\' 8"  (1.727 m)  Wt 89.268 kg (196 lb 12.8 oz)  BMI 29.92 kg/m2  PHYSICAL EXAM: Pt is alert and oriented, elderly male in NAD HEENT: normal Neck: JVP - normal, carotids 2+= without bruits Lungs: CTA bilaterally CV: RRR with crisp mechanical A2, 2/6 systolic ejection murmur at the LSB Abd: soft, NT, Positive BS Ext: no C/C/E Skin: warm/dry no rash  EKG:  Sinus rhythm with first-degree AV block, heart rate 60 beats per minute, otherwise within normal limits.  ASSESSMENT AND PLAN:

## 2012-09-26 ENCOUNTER — Encounter: Payer: Self-pay | Admitting: Cardiovascular Disease

## 2012-09-26 ENCOUNTER — Ambulatory Visit (INDEPENDENT_AMBULATORY_CARE_PROVIDER_SITE_OTHER): Payer: Medicare Other | Admitting: Cardiovascular Disease

## 2012-09-26 VITALS — BP 135/74 | HR 57 | Resp 18 | Ht 68.0 in | Wt 194.4 lb

## 2012-09-26 DIAGNOSIS — R0602 Shortness of breath: Secondary | ICD-10-CM

## 2012-09-26 DIAGNOSIS — I359 Nonrheumatic aortic valve disorder, unspecified: Secondary | ICD-10-CM

## 2012-09-26 NOTE — Patient Instructions (Addendum)
Your physician wants you to follow-up in: 6 MONTHS with Dr Cooper.  You will receive a reminder letter in the mail two months in advance. If you don't receive a letter, please call our office to schedule the follow-up appointment.  Your physician has requested that you have an echocardiogram in 6 MONTHS. Echocardiography is a painless test that uses sound waves to create images of your heart. It provides your doctor with information about the size and shape of your heart and how well your heart's chambers and valves are working. This procedure takes approximately one hour. There are no restrictions for this procedure.  Your physician recommends that you continue on your current medications as directed. Please refer to the Current Medication list given to you today.   

## 2012-09-26 NOTE — Progress Notes (Signed)
   HPI:  78 year old gentleman presenting for followup evaluation. The patient is followed for aortic stenosis and he underwent St. Jude mechanical aortic valve replacement in 1999 with concomitant CABG. From a symptomatic perspective he continues to do well. He is mostly limited by vision loss and decreased mobility. His wife notes that he seems to "breathe harder." He denies chest pain. He does not personally appreciate any shortness of breath. He denies orthopnea, PND, or edema. He maintains compliance with his medical program. He's had no bleeding problems.  Outpatient Encounter Prescriptions as of 09/26/2012  Medication Sig Dispense Refill  . amoxicillin (AMOXIL) 500 MG capsule Take 4 tabs 1 hour prior to dental procedure.  4 capsule  0  . aspirin 81 MG tablet Take 81 mg by mouth daily.        . dorzolamide (TRUSOPT) 2 % ophthalmic solution Place 1 drop into both eyes 2 (two) times daily at 10 AM and 5 PM.        . lisinopril (PRINIVIL,ZESTRIL) 10 MG tablet Take 10 mg by mouth daily.        . metoprolol (LOPRESSOR) 100 MG tablet Take 100 mg by mouth as directed. 1 qam and 1/2 qhs      . multivitamin-lutein (OCUVITE-LUTEIN) CAPS Take 1 capsule by mouth 2 (two) times daily.        Marland Kitchen omeprazole (PRILOSEC) 20 MG capsule Take 20 mg by mouth daily.        . Tamsulosin HCl (FLOMAX) 0.4 MG CAPS Take 0.4 mg by mouth daily.        . travoprost, benzalkonium, (TRAVATAN) 0.004 % ophthalmic solution Place 1 drop into both eyes at bedtime.        Marland Kitchen warfarin (COUMADIN) 5 MG tablet Take 5 mg by mouth daily.        . [DISCONTINUED] NON FORMULARY daily. Slippery elm bark daily        Allergies  Allergen Reactions  . Tetanus Toxoids     Past Medical History  Diagnosis Date  . CAD (coronary artery disease)     s/p CABG 1999  . HLD (hyperlipidemia)   . HTN (hypertension)     ROS: Negative except as per HPI  BP 135/74  Pulse 57  Resp 18  Ht 5\' 8"  (1.727 m)  Wt 88.179 kg (194 lb 6.4 oz)  BMI  29.56 kg/m2  PHYSICAL EXAM: Pt is alert and oriented, pleasant elderly male in NAD HEENT: normal Neck: JVP - normal, carotids 2+= without bruits Lungs: CTA bilaterally CV: RRR with normal mechanical aortic closure sounds. Abd: soft, NT, Positive BS, no hepatomegaly Ext: no C/C/E, distal pulses intact and equal Skin: warm/dry no rash  EKG:  Normal sinus rhythm 58 beats per minute, first degree AV block with PR interval 232 ms, otherwise within normal limits.  ASSESSMENT AND PLAN: 1. Aortic valve disorders, status post mechanical aortic valve replacement. I will check an echocardiogram prior to his return office visit in 6 months as he is out now 15 years from aortic valve replacement surgery. He has some vague shortness of breath but I do not appreciate any abnormalities on his exam. He will continue on Coumadin and low-dose aspirin as indicated by current guidelines.  2. Coronary artery disease. The patient has no anginal symptoms.  3. Hypertension. Blood pressure is well controlled on a combination of lisinopril and metoprolol.  Tonny Bollman 09/26/2012 1:20 PM

## 2013-01-25 ENCOUNTER — Encounter: Payer: Self-pay | Admitting: Cardiovascular Disease

## 2013-04-10 ENCOUNTER — Ambulatory Visit: Payer: Medicare Other | Admitting: Cardiovascular Disease

## 2013-04-10 ENCOUNTER — Other Ambulatory Visit (HOSPITAL_COMMUNITY): Payer: Medicare Other

## 2013-05-05 ENCOUNTER — Ambulatory Visit (INDEPENDENT_AMBULATORY_CARE_PROVIDER_SITE_OTHER): Payer: Medicare Other | Admitting: Cardiovascular Disease

## 2013-05-05 ENCOUNTER — Encounter: Payer: Self-pay | Admitting: Cardiovascular Disease

## 2013-05-05 ENCOUNTER — Ambulatory Visit (HOSPITAL_COMMUNITY): Payer: Medicare Other | Attending: Internal Medicine | Admitting: Radiology

## 2013-05-05 VITALS — BP 138/74 | HR 54 | Ht 68.0 in | Wt 182.0 lb

## 2013-05-05 DIAGNOSIS — Z954 Presence of other heart-valve replacement: Secondary | ICD-10-CM | POA: Insufficient documentation

## 2013-05-05 DIAGNOSIS — I25810 Atherosclerosis of coronary artery bypass graft(s) without angina pectoris: Secondary | ICD-10-CM

## 2013-05-05 DIAGNOSIS — E785 Hyperlipidemia, unspecified: Secondary | ICD-10-CM | POA: Insufficient documentation

## 2013-05-05 DIAGNOSIS — I10 Essential (primary) hypertension: Secondary | ICD-10-CM

## 2013-05-05 DIAGNOSIS — I359 Nonrheumatic aortic valve disorder, unspecified: Secondary | ICD-10-CM

## 2013-05-05 DIAGNOSIS — I2510 Atherosclerotic heart disease of native coronary artery without angina pectoris: Secondary | ICD-10-CM | POA: Insufficient documentation

## 2013-05-05 DIAGNOSIS — R0989 Other specified symptoms and signs involving the circulatory and respiratory systems: Secondary | ICD-10-CM | POA: Insufficient documentation

## 2013-05-05 DIAGNOSIS — R0609 Other forms of dyspnea: Secondary | ICD-10-CM | POA: Insufficient documentation

## 2013-05-05 DIAGNOSIS — R0602 Shortness of breath: Secondary | ICD-10-CM

## 2013-05-05 DIAGNOSIS — Z951 Presence of aortocoronary bypass graft: Secondary | ICD-10-CM | POA: Insufficient documentation

## 2013-05-05 NOTE — Patient Instructions (Addendum)
Your physician wants you to follow-up in: 6 MONTHS with Dr Cooper.  You will receive a reminder letter in the mail two months in advance. If you don't receive a letter, please call our office to schedule the follow-up appointment.  Your physician recommends that you continue on your current medications as directed. Please refer to the Current Medication list given to you today.  

## 2013-05-05 NOTE — Progress Notes (Signed)
Echocardiogram performed.  

## 2013-05-08 NOTE — Progress Notes (Signed)
Echo Report Faxed to Dr. Shary Decamp.Scarlette Ar

## 2013-05-09 ENCOUNTER — Encounter: Payer: Self-pay | Admitting: Cardiovascular Disease

## 2013-05-09 NOTE — Progress Notes (Signed)
HPI:  79 year old gentleman presenting for followup evaluation. He underwent remote St. Jude mechanical aortic valve replacement in 1999 with concomitant CABG. He's had very little cardiovascular problems since his surgery. He struggles with his vision. When I saw him last 6 months ago, his wife was concerned about his breathing. She noted that he became more short of breath with lower level activities. The patient has not had chest pain or pressure, orthopnea, PND, or leg swelling. An echocardiogram this morning showed preserved left ventricular systolic function with normal function of his aortic valve prosthesis. The patient is tolerating Coumadin and denies any bleeding problems.  Outpatient Encounter Prescriptions as of 05/05/2013  Medication Sig Dispense Refill  . amoxicillin (AMOXIL) 500 MG capsule Take 4 tabs 1 hour prior to dental procedure.  4 capsule  0  . aspirin 81 MG tablet Take 81 mg by mouth daily.        . dorzolamide (TRUSOPT) 2 % ophthalmic solution Place 1 drop into both eyes 2 (two) times daily at 10 AM and 5 PM.        . lisinopril (PRINIVIL,ZESTRIL) 10 MG tablet Take 10 mg by mouth daily.        . metoprolol (LOPRESSOR) 100 MG tablet Take 100 mg by mouth as directed. 1 qam and 1/2 qhs      . multivitamin-lutein (OCUVITE-LUTEIN) CAPS Take 1 capsule by mouth 2 (two) times daily.        Marland Kitchen omeprazole (PRILOSEC) 20 MG capsule Take 20 mg by mouth daily.        . Tamsulosin HCl (FLOMAX) 0.4 MG CAPS Take 0.4 mg by mouth daily.        . travoprost, benzalkonium, (TRAVATAN) 0.004 % ophthalmic solution Place 1 drop into both eyes at bedtime.        Marland Kitchen warfarin (COUMADIN) 5 MG tablet Take 5 mg by mouth daily.         No facility-administered encounter medications on file as of 05/05/2013.    Allergies  Allergen Reactions  . Tetanus Toxoids     Past Medical History  Diagnosis Date  . CAD (coronary artery disease)     s/p CABG 1999  . HLD (hyperlipidemia)   . HTN (hypertension)      ROS: Negative except as per HPI  BP 138/74  Pulse 54  Ht 5\' 8"  (1.727 m)  Wt 82.555 kg (182 lb)  BMI 27.68 kg/m2  PHYSICAL EXAM: Pt is alert and oriented, elderly male in NAD HEENT: normal Neck: JVP - normal, carotids 2+= without bruits Lungs: CTA bilaterally CV: RRR with soft ejection murmur at the RUSB and normal mechanical A2.  Abd: soft, NT, Positive BS, no hepatomegaly Ext: no C/C/E, distal pulses intact and equal Skin: warm/dry no rash  EKG:  Sinus bradycardia 54 beats per minute, first degree AV block, left axis deviation  2D Echo: Left ventricle: The cavity size was normal. Wall thickness was normal. Systolic function was normal. The estimated ejection fraction was in the range of 60% to 65%. Doppler parameters are consistent with abnormal left ventricular relaxation (grade 1 diastolic dysfunction).  ------------------------------------------------------------ Aortic valve: AV prosthesis is difficult to see well Peak and mean gradients through the valve are 14 and 6 mm Hg respectively. Doppler: No significant regurgitation. VTI ratio of LVOT to aortic valve: 0.8. Peak velocity ratio of LVOT to aortic valve: 0.62. Mean gradient: 7mm Hg (S). Peak gradient: 15mm Hg (S).  ------------------------------------------------------------ Aorta: Proximal ascending aorta is mildly dilated  at 41 mm.  ------------------------------------------------------------ Mitral valve: Mildly thickened leaflets . Doppler: Trivial regurgitation.  ------------------------------------------------------------ Left atrium: The atrium was normal in size.  ------------------------------------------------------------ Right ventricle: The cavity size was normal. Wall thickness was normal. Systolic function was normal.  ------------------------------------------------------------ Pulmonic valve: Structurally normal valve. Cusp separation was normal. Doppler: Transvalvular velocity  was within the normal range. Trivial regurgitation.  ------------------------------------------------------------ Tricuspid valve: Structurally normal valve. Leaflet separation was normal. Doppler: Transvalvular velocity was within the normal range. Mild regurgitation.  ------------------------------------------------------------ Right atrium: The atrium was normal in size.  ------------------------------------------------------------ Pericardium: There was no pericardial effusion.  ------------------------------------------------------------ Systemic veins: Inferior vena cava: The vessel was normal in size; the respirophasic diameter changes were in the normal range (= 50%); findings are consistent with normal central venous pressure.  ------------------------------------------------------------ Post procedure conclusions Ascending Aorta:  - Proximal ascending aorta is mildly dilated at 41 mm.  ASSESSMENT AND PLAN: 1. Aortic valve disease status post mechanical aVR remotely. Exam remains stable. Echocardiogram reviewed and shows normal function of his aortic valve prosthesis. He has no bleeding on long-term warfarin. Continue same medical program. I will see him back in 6 months for followup.  2. Hypertension. Blood pressure is well controlled on a combination of lisinopril and metoprolol.  Tonny Bollman 05/09/2013 12:13 AM

## 2013-06-02 ENCOUNTER — Telehealth: Payer: Self-pay | Admitting: Cardiovascular Disease

## 2013-06-02 NOTE — Telephone Encounter (Signed)
Yes but with a mechanical valve he needs to start right back on warfarin the evening of the injection.

## 2013-06-02 NOTE — Telephone Encounter (Signed)
New problem   Kim/[facility] want to know if pt can be off coumadin for 5 day to have hip injection. Please call [name]

## 2013-06-02 NOTE — Telephone Encounter (Signed)
Clayton [name] a detail message regarding MD's recommendations. [name] is to call back if any questions.

## 2013-06-02 NOTE — Telephone Encounter (Signed)
Kim at [facility] would like to know if pt can be off coumadin 5 days prior a hip injection. Pt will be scheduled for the procedure after  clear by the MD.

## 2013-07-31 ENCOUNTER — Telehealth: Payer: Self-pay | Admitting: Cardiovascular Disease

## 2013-07-31 DIAGNOSIS — K469 Unspecified abdominal hernia without obstruction or gangrene: Secondary | ICD-10-CM

## 2013-07-31 NOTE — Telephone Encounter (Signed)
New Problem  Pt is on coumadin and has a Hernia. primary care advises surgery.. Pt's wife would like to consult w/ Dr. Excell Seltzer about the potential operation to confirm that this is a good option.

## 2013-07-31 NOTE — Telephone Encounter (Signed)
I spoke with the pt's wife and the pt saw Dr Maryruth Bun (surgeon) in [place] last month and said that they would not do hernia surgery unless hernia enlarged.  Since this consult the pt's hernia has enlarged and the pt's wife would feel more comfortable if the pt saw a Careers adviser in [place].  She would like Dr Excell Seltzer to recommend a surgeon.  I made her aware that we will not address clearance or coumadin until the pt is actually scheduled for surgery. The pt's wife works and would like a call back after 4:30 pm when returning the call.

## 2013-08-04 NOTE — Telephone Encounter (Signed)
Would refer to [facility] - [facility] or [employer] thx

## 2013-08-04 NOTE — Telephone Encounter (Signed)
Order placed for referral to [facility].

## 2013-08-07 NOTE — Telephone Encounter (Signed)
Follow Up:  Pt's wife states they called last week and they still haven't gotten a response. Pt's wife states it's regarding Dr Excell Seltzer scheduling hernia surgery for her husband. Please advise

## 2013-08-07 NOTE — Telephone Encounter (Signed)
I spoke with the pt's wife and made her aware that a referral has been placed for [facility] to evaluate pt for hernia. I have sent message to [facility] to handle referral.

## 2013-08-15 ENCOUNTER — Ambulatory Visit (INDEPENDENT_AMBULATORY_CARE_PROVIDER_SITE_OTHER): Payer: Medicare Other | Admitting: Surgery

## 2013-08-15 ENCOUNTER — Other Ambulatory Visit (INDEPENDENT_AMBULATORY_CARE_PROVIDER_SITE_OTHER): Payer: Self-pay | Admitting: Surgery

## 2013-08-15 ENCOUNTER — Encounter (INDEPENDENT_AMBULATORY_CARE_PROVIDER_SITE_OTHER): Payer: Self-pay | Admitting: General Surgery

## 2013-08-15 ENCOUNTER — Encounter (INDEPENDENT_AMBULATORY_CARE_PROVIDER_SITE_OTHER): Payer: Self-pay | Admitting: Surgery

## 2013-08-15 VITALS — BP 130/82 | HR 77 | Temp 97.6°F | Resp 16 | Ht 68.0 in | Wt 184.2 lb

## 2013-08-15 DIAGNOSIS — K469 Unspecified abdominal hernia without obstruction or gangrene: Secondary | ICD-10-CM

## 2013-08-15 DIAGNOSIS — K4090 Unilateral inguinal hernia, without obstruction or gangrene, not specified as recurrent: Secondary | ICD-10-CM
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
## 2013-08-16 ENCOUNTER — Telehealth: Payer: Self-pay

## 2013-08-16 ENCOUNTER — Encounter: Payer: Self-pay | Admitting: Cardiovascular Disease

## 2013-08-16 NOTE — Telephone Encounter (Signed)
This encounter was created in error - please disregard.

## 2013-08-16 NOTE — Telephone Encounter (Signed)
Note from Dr Excell Seltzer in regards to clearance:  Clayton [name] - with a St Jude valve I would have him hold warfarin 5 days before surgery and start back evening of surgery (if ok with Dr Corliss Skains) without a lovenox bridge. ----- Message ----- From: Wilmon Arms. Corliss Skains, MD Sent: 08/15/2013 4:03 PM To: Tonny Bollman, MD  Left message for pt to call back.

## 2013-08-16 NOTE — Telephone Encounter (Signed)
F/U    Pt's wife returned call from [employer].    She will call back.

## 2013-08-16 NOTE — Telephone Encounter (Signed)
I spoke with the pt's wife and made her aware of medication instructions per Dr Excell Seltzer.  The pt is not scheduled for surgery at this time. He does have a surgical clearance appointment scheduled with our office on 08/23/13 with Herma Carson.

## 2013-08-22 ENCOUNTER — Telehealth (INDEPENDENT_AMBULATORY_CARE_PROVIDER_SITE_OTHER): Payer: Self-pay | Admitting: General Surgery

## 2013-08-22 NOTE — Telephone Encounter (Signed)
Called patient this morning and patient and his wife are still sure if they want Clayton [name] to have the hernia surgery, I told Mrs Farquharson to call me back if he wants to have surgery and I will put his paper up

## 2013-08-23 ENCOUNTER — Ambulatory Visit (INDEPENDENT_AMBULATORY_CARE_PROVIDER_SITE_OTHER): Payer: Medicare Other | Admitting: Physician Assistant

## 2013-08-23 ENCOUNTER — Encounter: Payer: Self-pay | Admitting: Physician Assistant

## 2013-08-23 VITALS — BP 150/70 | HR 55 | Ht 68.0 in | Wt 187.0 lb

## 2013-08-23 DIAGNOSIS — Z01818 Encounter for other preprocedural examination: Secondary | ICD-10-CM

## 2013-08-23 DIAGNOSIS — I25810 Atherosclerosis of coronary artery bypass graft(s) without angina pectoris: Secondary | ICD-10-CM

## 2013-08-23 DIAGNOSIS — I10 Essential (primary) hypertension: Secondary | ICD-10-CM

## 2013-08-23 NOTE — Assessment & Plan Note (Addendum)
Patient is here for preoperative clearance before undergoing hernia repair. He has history of aortic valve replacement with concomitant CABG in 1999. He is asymptomatic from a cardiac standpoint. His 2-D echo in June 2014 showed normal systolic function and normal function of his aortic valve prosthesis. He is maintained on Coumadin. I discussed this patient with Weston Brass and Dr. Graciela Husbands who agreed he is at reasonable risk for surgery. He may stop his Coumadin 5 days prior to surgery without a Lovenox bridge and resume Coumadin as soon as possible after surgery.

## 2013-08-23 NOTE — Assessment & Plan Note (Signed)
Stable

## 2013-08-23 NOTE — Patient Instructions (Addendum)
STOP YOUR COUMADIN 5 DAYS BEFORE SURGERY  YOU ARE CLEAR FOR SURGERY  Your physician recommends that you continue on your current medications as directed. Please refer to the Current Medication list given to you today.

## 2013-08-23 NOTE — Assessment & Plan Note (Signed)
No symptoms of chest pain

## 2013-08-23 NOTE — Progress Notes (Signed)
HPI:   This is a very pleasant 80 year old patient of Dr. Excell Seltzer who has a history of St. Jude aortic valve replacement 1999 with concomitant CABG at that time. He is here for presurgical clearance before undergoing hernia repair. They like to stop his Coumadin 5 days prior to surgery. This has been cleared by Dr. Excell Seltzer without a Lovenox bridge. The patient was seen by Dr. Excell Seltzer in June 2014 and was doing well. 2-D echo at that time showed normal LV function ejection fraction 60-65% with grade 1 diastolic dysfunction. Aortic valve prosthesis was difficult to see but peak and mean gradients through the valve are 14 and 6 mmHg respectively. He also has a proximal descending aorta but mildly dilated at 41 mm.  The patient comes in today for cardiac clearance. He denies any chest pain, palpitations, dyspnea, dyspnea on exertion, dizziness or presyncope. His wife states that she felt he was out of breath 2 different occasions in the past couple months. He denies being short of breath. She thinks when he sleeps at night his breathing seems heavy. He has no trouble with heart failure or edema. He does not have sleep apnea.  Allergies -- Tetanus Toxoids   Current Outpatient Prescriptions on File Prior to Visit: amoxicillin (AMOXIL) 500 MG capsule, Take 4 tabs 1 hour prior to dental procedure., Disp: 4 capsule, Rfl: 0 aspirin 81 MG tablet, Take 81 mg by mouth daily.  , Disp: , Rfl:  dorzolamide (TRUSOPT) 2 % ophthalmic solution, Place 1 drop into both eyes 2 (two) times daily at 10 AM and 5 PM.  , Disp: , Rfl:  dorzolamide-timolol (COSOPT) 22.3-6.8 MG/ML ophthalmic solution, , Disp: , Rfl:  latanoprost (XALATAN) 0.005 % ophthalmic solution, , Disp: , Rfl:  lisinopril (PRINIVIL,ZESTRIL) 10 MG tablet, Take 10 mg by mouth daily.  , Disp: , Rfl:  meclizine (ANTIVERT) 25 MG tablet, , Disp: , Rfl:  metoprolol (LOPRESSOR) 100 MG tablet, Take 100 mg by mouth as directed. 1 qam and 1/2 qhs, Disp: , Rfl:   multivitamin-lutein (OCUVITE-LUTEIN) CAPS, Take 1 capsule by mouth 2 (two) times daily.  , Disp: , Rfl:  omeprazole (PRILOSEC) 20 MG capsule, Take 20 mg by mouth daily.  , Disp: , Rfl:  Tamsulosin HCl (FLOMAX) 0.4 MG CAPS, Take 0.4 mg by mouth daily.  , Disp: , Rfl:  travoprost, benzalkonium, (TRAVATAN) 0.004 % ophthalmic solution, Place 1 drop into both eyes at bedtime.  , Disp: , Rfl:  warfarin (COUMADIN) 5 MG tablet, Take 5 mg by mouth daily.  , Disp: , Rfl:   No current facility-administered medications on file prior to visit.   Past Medical History:   CAD (coronary artery disease)                                  Comment:s/p CABG 1999   HLD (hyperlipidemia)                                         HTN (hypertension)                                           GERD (gastroesophageal reflux disease)  Past Surgical History:   AORTIC VALVE REPLACEMENT                         1998         CORONARY ARTERY BYPASS GRAFT                     1998         CHOLECYSTECTOMY                                               TONSILLECTOMY                                                Review of patient's family history indicates:   Coronary artery disease                                 Social History   Marital Status: Married             Spouse Name:                      Years of Education:                 Number of children: 3           Occupational History Occupation          Associate Professor            Comment              retired                                   Chief Executive Officer History Main Topics   Smoking Status: Former Smoker                   Packs/Day: 0.00  Years:         Smokeless Status: Not on file                      Alcohol Use: No             Drug Use: No             Sexual Activity: Not on file        Other Topics            Concern   None on file  Social History Narrative   None on file    ROS: See history of present illness otherwise negative   PHYSICAL  EXAM: Well-nournished, in no acute distress. Neck: No JVD, HJR, Bruit, or thyroid enlargement  Lungs: No tachypnea, clear without wheezing, rales, or rhonchi  Cardiovascular: RRR, PMI not displaced, crisp heart valve sounds, no gallops, bruit, thrill, or heave.  Abdomen: BS normal. Soft without organomegaly, masses, lesions or tenderness.  Extremities: without cyanosis, clubbing or edema. Good distal pulses bilateral  SKin: Warm, no lesions or rashes   Musculoskeletal: No deformities  Neuro: no focal signs  BP 150/70  Pulse 55  Ht 5\' 8"  (  1.727 m)  Wt 187 lb (84.823 kg)  BMI 28.44 kg/m2   EKG: Sinus bradycardia 56 beats per minute with first degree AV block   Study Conclusions 05/05/13:  - Left ventricle: The cavity size was normal. Wall thickness   was normal. Systolic function was normal. The estimated   ejection fraction was in the range of 60% to 65%. Doppler   parameters are consistent with abnormal left ventricular   relaxation (grade 1 diastolic dysfunction). - Aortic valve: AV prosthesis is difficult to see well Peak   and mean gradients through the valve are 14 and 6 mm Hg   respectively.

## 2013-08-25 ENCOUNTER — Encounter (INDEPENDENT_AMBULATORY_CARE_PROVIDER_SITE_OTHER): Payer: Self-pay

## 2013-09-14 ENCOUNTER — Encounter (HOSPITAL_COMMUNITY): Payer: Self-pay | Admitting: Pharmacy Technician

## 2013-09-18 ENCOUNTER — Ambulatory Visit (HOSPITAL_COMMUNITY)
Admission: RE | Admit: 2013-09-18 | Discharge: 2013-09-18 | Disposition: A | Discharge status: Home / Self Care | Payer: Medicare Other | Source: Ambulatory Visit | Attending: Anesthesiology | Admitting: Anesthesiology

## 2013-09-18 ENCOUNTER — Encounter (HOSPITAL_COMMUNITY): Payer: Self-pay

## 2013-09-18 ENCOUNTER — Encounter (HOSPITAL_COMMUNITY)
Admission: RE | Admit: 2013-09-18 | Discharge: 2013-09-18 | Disposition: A | Discharge status: Home / Self Care | Payer: Medicare Other | Source: Ambulatory Visit | Attending: Surgery | Admitting: Surgery

## 2013-09-18 DIAGNOSIS — K4090 Unilateral inguinal hernia, without obstruction or gangrene, not specified as recurrent: Secondary | ICD-10-CM | POA: Insufficient documentation

## 2013-09-18 DIAGNOSIS — Z01818 Encounter for other preprocedural examination: Secondary | ICD-10-CM | POA: Insufficient documentation

## 2013-09-18 DIAGNOSIS — Z87891 Personal history of nicotine dependence: Secondary | ICD-10-CM | POA: Insufficient documentation

## 2013-09-18 HISTORY — DX: Aortic aneurysm of unspecified site, without rupture: I71.9

## 2013-09-18 HISTORY — DX: Personal history of other diseases of the digestive system: Z87.19

## 2013-09-18 HISTORY — DX: Peripheral vascular disease, unspecified: I73.9

## 2013-09-18 HISTORY — DX: Unspecified osteoarthritis, unspecified site: M19.90

## 2013-09-18 LAB — PROTIME-INR
INR: 3.43 — ABNORMAL HIGH (ref 0.00–1.49)
Prothrombin Time: 33.3 seconds — ABNORMAL HIGH (ref 11.6–15.2)

## 2013-09-18 LAB — BASIC METABOLIC PANEL
BUN: 21 mg/dL (ref 6–23)
Calcium: 8.8 mg/dL (ref 8.4–10.5)
Chloride: 106 mEq/L (ref 96–112)
GFR calc Af Amer: 63 mL/min — ABNORMAL LOW (ref >90)
GFR calc non Af Amer: 54 mL/min — ABNORMAL LOW (ref >90)
Glucose, Bld: 100 mg/dL — ABNORMAL HIGH (ref 70–99)
Potassium: 4.5 mEq/L (ref 3.5–5.1)
Sodium: 140 mEq/L (ref 135–145)

## 2013-09-18 LAB — CBC
HCT: 36.8 % — ABNORMAL LOW (ref 39.0–52.0)
Hemoglobin: 11.9 g/dL — ABNORMAL LOW (ref 13.0–17.0)
MCHC: 32.3 g/dL (ref 30.0–36.0)
MCV: 93.2 fL (ref 78.0–100.0)
RBC: 3.95 MIL/uL — ABNORMAL LOW (ref 4.22–5.81)

## 2013-09-18 NOTE — Progress Notes (Signed)
Patient wife stated patient found to have aneuysm by dr Shary Decamp pcp (found since June 14) wife does not know where it is located. req'd office notes ,any tests from dr Shary Decamp .

## 2013-09-18 NOTE — Pre-Procedure Instructions (Addendum)
Argel T Robben  09/18/2013   Your procedure is scheduled on:  09/25/13  Report to [facility] cone short stay admitting at 530 AM.  Call this number if you have problems the morning of surgery: [phone]   Remember:   Do not eat food or drink liquids after midnight.   Take these medicines the morning of surgery with A SIP OF WATER: eye drops, metoprolol, omeprazole,               STOP coumadin per dr 09/20/13                 STOP all herbel meds, nsaids (aleve,naproxen,advil,ibuprofen) 5 days prior to surgery   Do not wear jewelry, make-up or nail polish.  Do not wear lotions, powders, or perfumes. You may wear deodorant.  Do not shave 48 hours prior to surgery. Men may shave face and neck.  Do not bring valuables to the hospital.  [facility] is not responsible                  for any belongings or valuables.               Contacts, dentures or bridgework may not be worn into surgery.  Leave suitcase in the car. After surgery it may be brought to your room.  For patients admitted to the hospital, discharge time is determined by your                treatment team.               Patients discharged the day of surgery will not be allowed to drive  home.  Name and phone number of your driver:   Special Instructions: Shower using CHG 2 nights before surgery and the night before surgery.  If you shower the day of surgery use CHG.  Use special wash - you have one bottle of CHG for all showers.  You should use approximately 1/3 of the bottle for each shower.   Please read over the following fact sheets that you were given: Pain Booklet, Coughing and Deep Breathing and Surgical Site Infection Prevention

## 2013-09-19 ENCOUNTER — Encounter (HOSPITAL_COMMUNITY): Payer: Self-pay

## 2013-09-19 NOTE — Progress Notes (Signed)
Anesthesia Chart Review:  Patient is a 79 year old male scheduled for right IHR on 09/25/13 by Dr. Corliss Skains.  History includes CAD/AS s/p combined CABG with St. Jude AVR '99, non-smoker, HLD, hiatal hernia, HTN, GERD, arthritis, aneurysm (lumbar CT on 05/22/13 showed: "fusiform infrarenal aortic aneurysm at least 3 cm in diameter, incompletely visualized but not convincingly changed from previous exam" on 05/08/13; also with 41 mm dilatation noted of proximal ascending aorta on 05/05/13 echo).  PCP is Dr. Shary Decamp.  Cardiologist is Dr. Excell Seltzer.  He was seen for a preoperative cardiology evaluation on 08/23/13 by Jacolyn Reedy, PA-C who cleared patient for this procedure with recommendations for Lovenox bridge.   Echo on 05/05/13 showed:  - Left ventricle: The cavity size was normal. Wall thickness was normal. Systolic function was normal. The estimated ejection fraction was in the range of 60% to 65%. Doppler parameters are consistent with abnormal left ventricular relaxation (grade 1 diastolic dysfunction). - Aortic valve: AV prosthesis is difficult to see well Peak and mean gradients through the valve are 14 and 6 mm Hg respectively. - Aorta: Proximal ascending aorta is mildly dilated at 41 mm. - Trivial mitral and pulmonic regurgitation, mild tricuspid regurgitation.  EKG on 08/23/13 showed SB with first degree AVB.  Preoperative CXR and labs noted.  Plant for repeat PT/PTT on the day of surgery.  He has been cleared by cardiology.  If repeat coags are acceptable on the day of surgery and otherwise no acute changes then I would anticipate that he could proceed as planned.  Velna Ochs [facility] Short Stay Center/Anesthesiology Phone [phone] 09/19/2013 4:59 PM

## 2013-09-24 MED ORDER — CEFAZOLIN SODIUM-DEXTROSE 2-3 GM-% IV SOLR
2.0000 g | INTRAVENOUS | Status: DC
  Filled 2013-09-24: qty 50

## 2013-09-25 ENCOUNTER — Encounter (HOSPITAL_COMMUNITY)
Admission: RE | Disposition: A | Discharge status: Home / Self Care | Payer: Self-pay | Source: Ambulatory Visit | Attending: Surgery

## 2013-09-25 ENCOUNTER — Encounter (HOSPITAL_COMMUNITY): Payer: Self-pay | Admitting: Certified Registered"

## 2013-09-25 ENCOUNTER — Ambulatory Visit (HOSPITAL_COMMUNITY): Payer: Medicare Other | Admitting: Certified Registered"

## 2013-09-25 ENCOUNTER — Telehealth (INDEPENDENT_AMBULATORY_CARE_PROVIDER_SITE_OTHER): Payer: Self-pay

## 2013-09-25 ENCOUNTER — Encounter (HOSPITAL_COMMUNITY): Payer: Medicare Other | Admitting: Vascular Surgery

## 2013-09-25 ENCOUNTER — Observation Stay (HOSPITAL_COMMUNITY)
Admission: RE | Admit: 2013-09-25 | Discharge: 2013-09-26 | Disposition: A | Discharge status: Home / Self Care | Payer: Medicare Other | Source: Ambulatory Visit | Attending: Surgery | Admitting: Surgery

## 2013-09-25 DIAGNOSIS — I739 Peripheral vascular disease, unspecified: Secondary | ICD-10-CM | POA: Insufficient documentation

## 2013-09-25 DIAGNOSIS — R011 Cardiac murmur, unspecified: Secondary | ICD-10-CM | POA: Insufficient documentation

## 2013-09-25 DIAGNOSIS — K4090 Unilateral inguinal hernia, without obstruction or gangrene, not specified as recurrent: Principal | ICD-10-CM | POA: Insufficient documentation

## 2013-09-25 DIAGNOSIS — K219 Gastro-esophageal reflux disease without esophagitis: Secondary | ICD-10-CM | POA: Insufficient documentation

## 2013-09-25 DIAGNOSIS — I2510 Atherosclerotic heart disease of native coronary artery without angina pectoris: Secondary | ICD-10-CM | POA: Insufficient documentation

## 2013-09-25 DIAGNOSIS — I10 Essential (primary) hypertension: Secondary | ICD-10-CM | POA: Insufficient documentation

## 2013-09-25 DIAGNOSIS — Z87891 Personal history of nicotine dependence: Secondary | ICD-10-CM | POA: Insufficient documentation

## 2013-09-25 HISTORY — PX: INGUINAL HERNIA REPAIR: SUR1180

## 2013-09-25 HISTORY — PX: INSERTION OF MESH: SHX5868

## 2013-09-25 HISTORY — PX: INGUINAL HERNIA REPAIR: SHX194

## 2013-09-25 LAB — APTT: aPTT: 38 seconds — ABNORMAL HIGH (ref 24–37)

## 2013-09-25 LAB — PROTIME-INR
INR: 1.39 (ref 0.00–1.49)
Prothrombin Time: 16.7 seconds — ABNORMAL HIGH (ref 11.6–15.2)

## 2013-09-25 SURGERY — REPAIR, HERNIA, INGUINAL, ADULT
Anesthesia: General | Site: Groin | Laterality: Right | Wound class: Clean

## 2013-09-25 MED ORDER — LISINOPRIL 10 MG PO TABS
10.0000 mg | ORAL_TABLET | Freq: Every day | ORAL | Status: DC
  Administered 2013-09-25 – 2013-09-26 (×2): 10 mg via ORAL
  Filled 2013-09-25 (×2): qty 1

## 2013-09-25 MED ORDER — WARFARIN - PHARMACIST DOSING INPATIENT: Freq: Every day | Status: DC

## 2013-09-25 MED ORDER — MORPHINE SULFATE 2 MG/ML IJ SOLN
2.0000 mg | INTRAMUSCULAR | Status: DC | PRN
  Administered 2013-09-25: 2 mg via INTRAVENOUS
  Filled 2013-09-25: qty 1

## 2013-09-25 MED ORDER — FENTANYL CITRATE 0.05 MG/ML IJ SOLN
INTRAMUSCULAR | Status: AC
  Administered 2013-09-25: 50 ug via INTRAVENOUS
  Filled 2013-09-25: qty 2

## 2013-09-25 MED ORDER — PROPOFOL 10 MG/ML IV BOLUS
INTRAVENOUS | Status: DC | PRN
  Administered 2013-09-25: 170 mg via INTRAVENOUS

## 2013-09-25 MED ORDER — CEFAZOLIN SODIUM 1-5 GM-% IV SOLN
1.0000 g | Freq: Four times a day (QID) | INTRAVENOUS | Status: AC
  Administered 2013-09-25 – 2013-09-26 (×3): 1 g via INTRAVENOUS
  Filled 2013-09-25 (×3): qty 50

## 2013-09-25 MED ORDER — FENTANYL CITRATE 0.05 MG/ML IJ SOLN
25.0000 ug | INTRAMUSCULAR | Status: DC | PRN
  Administered 2013-09-25: 50 ug via INTRAVENOUS

## 2013-09-25 MED ORDER — SUFENTANIL CITRATE 50 MCG/ML IV SOLN
INTRAVENOUS | Status: DC | PRN
  Administered 2013-09-25 (×2): 5 ug via INTRAVENOUS

## 2013-09-25 MED ORDER — CEFAZOLIN SODIUM-DEXTROSE 2-3 GM-% IV SOLR
INTRAVENOUS | Status: DC | PRN
  Administered 2013-09-25: 2 g via INTRAVENOUS

## 2013-09-25 MED ORDER — METOCLOPRAMIDE HCL 5 MG/ML IJ SOLN: 10.0000 mg | Freq: Once | INTRAMUSCULAR | Status: DC | PRN

## 2013-09-25 MED ORDER — HYDROCODONE-ACETAMINOPHEN 5-325 MG PO TABS
1.0000 | ORAL_TABLET | ORAL | Status: DC | PRN
  Administered 2013-09-26: 1 via ORAL
  Filled 2013-09-25: qty 1

## 2013-09-25 MED ORDER — DORZOLAMIDE HCL-TIMOLOL MAL 2-0.5 % OP SOLN
1.0000 [drp] | Freq: Every morning | OPHTHALMIC | Status: DC
  Administered 2013-09-26: 1 [drp] via OPHTHALMIC
  Filled 2013-09-25: qty 10

## 2013-09-25 MED ORDER — TAMSULOSIN HCL 0.4 MG PO CAPS
0.4000 mg | ORAL_CAPSULE | Freq: Every day | ORAL | Status: DC
  Administered 2013-09-25: 0.4 mg via ORAL
  Filled 2013-09-25 (×2): qty 1

## 2013-09-25 MED ORDER — BUPIVACAINE-EPINEPHRINE PF 0.25-1:200000 % IJ SOLN
INTRAMUSCULAR | Status: AC
  Filled 2013-09-25: qty 30

## 2013-09-25 MED ORDER — WARFARIN SODIUM 6 MG PO TABS
7.0000 mg | ORAL_TABLET | ORAL | Status: DC
  Administered 2013-09-25: 18:00:00 7 mg via ORAL
  Filled 2013-09-25: qty 1

## 2013-09-25 MED ORDER — ONDANSETRON HCL 4 MG PO TABS: 4.0000 mg | ORAL_TABLET | Freq: Four times a day (QID) | ORAL | Status: DC | PRN

## 2013-09-25 MED ORDER — ASPIRIN EC 81 MG PO TBEC
81.0000 mg | DELAYED_RELEASE_TABLET | Freq: Every day | ORAL | Status: DC
  Administered 2013-09-25 – 2013-09-26 (×2): 81 mg via ORAL
  Filled 2013-09-25 (×2): qty 1

## 2013-09-25 MED ORDER — LATANOPROST 0.005 % OP SOLN
1.0000 [drp] | Freq: Every day | OPHTHALMIC | Status: DC
  Administered 2013-09-25: 1 [drp] via OPHTHALMIC
  Filled 2013-09-25: qty 2.5

## 2013-09-25 MED ORDER — WARFARIN SODIUM 6 MG PO TABS
6.0000 mg | ORAL_TABLET | ORAL | Status: DC
  Filled 2013-09-25: qty 1

## 2013-09-25 MED ORDER — EPHEDRINE SULFATE 50 MG/ML IJ SOLN
INTRAMUSCULAR | Status: DC | PRN
  Administered 2013-09-25: 10 mg via INTRAVENOUS

## 2013-09-25 MED ORDER — BUPIVACAINE-EPINEPHRINE 0.25% -1:200000 IJ SOLN
INTRAMUSCULAR | Status: DC | PRN
  Administered 2013-09-25: 10 mL

## 2013-09-25 MED ORDER — LIDOCAINE HCL (CARDIAC) 20 MG/ML IV SOLN
INTRAVENOUS | Status: DC | PRN
  Administered 2013-09-25: 30 mg via INTRAVENOUS

## 2013-09-25 MED ORDER — DEXAMETHASONE SODIUM PHOSPHATE 4 MG/ML IJ SOLN
INTRAMUSCULAR | Status: DC | PRN
  Administered 2013-09-25: 4 mg via INTRAVENOUS

## 2013-09-25 MED ORDER — METOPROLOL TARTRATE 50 MG PO TABS: 50.0000 mg | ORAL_TABLET | Freq: Two times a day (BID) | ORAL | Status: DC

## 2013-09-25 MED ORDER — CHLORHEXIDINE GLUCONATE 4 % EX LIQD: 1.0000 "application " | Freq: Once | CUTANEOUS | Status: DC

## 2013-09-25 MED ORDER — ONDANSETRON HCL 4 MG/2ML IJ SOLN
INTRAMUSCULAR | Status: DC | PRN
  Administered 2013-09-25: 4 mg via INTRAVENOUS

## 2013-09-25 MED ORDER — ONDANSETRON HCL 4 MG/2ML IJ SOLN: 4.0000 mg | Freq: Four times a day (QID) | INTRAMUSCULAR | Status: DC | PRN

## 2013-09-25 MED ORDER — METOPROLOL TARTRATE 100 MG PO TABS
100.0000 mg | ORAL_TABLET | Freq: Every day | ORAL | Status: DC
  Administered 2013-09-26: 100 mg via ORAL
  Filled 2013-09-25: qty 1

## 2013-09-25 MED ORDER — LACTATED RINGERS IV SOLN
INTRAVENOUS | Status: DC | PRN
  Administered 2013-09-25: 07:00:00 via INTRAVENOUS

## 2013-09-25 MED ORDER — METOPROLOL TARTRATE 50 MG PO TABS
50.0000 mg | ORAL_TABLET | Freq: Every day | ORAL | Status: DC
  Administered 2013-09-25: 50 mg via ORAL
  Filled 2013-09-25 (×2): qty 1

## 2013-09-25 MED ORDER — WARFARIN SODIUM 6 MG PO TABS: 6.0000 mg | ORAL_TABLET | ORAL | Status: DC

## 2013-09-25 MED ORDER — OXYCODONE HCL 5 MG/5ML PO SOLN: 5.0000 mg | Freq: Once | ORAL | Status: DC | PRN

## 2013-09-25 MED ORDER — OXYCODONE HCL 5 MG PO TABS: 5.0000 mg | ORAL_TABLET | Freq: Once | ORAL | Status: DC | PRN

## 2013-09-25 MED ORDER — ENOXAPARIN SODIUM 40 MG/0.4ML ~~LOC~~ SOLN
40.0000 mg | SUBCUTANEOUS | Status: DC
  Administered 2013-09-26: 40 mg via SUBCUTANEOUS
  Filled 2013-09-25 (×2): qty 0.4

## 2013-09-25 MED ORDER — 0.9 % SODIUM CHLORIDE (POUR BTL) OPTIME
TOPICAL | Status: DC | PRN
  Administered 2013-09-25: 1000 mL

## 2013-09-25 SURGICAL SUPPLY — 53 items
APL SKNCLS STERI-STRIP NONHPOA (GAUZE/BANDAGES/DRESSINGS) ×1
BENZOIN TINCTURE PRP APPL 2/3 (GAUZE/BANDAGES/DRESSINGS) ×2 IMPLANT
BLADE SURG 15 STRL LF DISP TIS (BLADE) ×1 IMPLANT
BLADE SURG 15 STRL SS (BLADE) ×2
BLADE SURG ROTATE 9660 (MISCELLANEOUS) IMPLANT
CHLORAPREP W/TINT 26ML (MISCELLANEOUS) ×2 IMPLANT
COVER SURGICAL LIGHT HANDLE (MISCELLANEOUS) ×2 IMPLANT
DECANTER SPIKE VIAL GLASS SM (MISCELLANEOUS) ×1 IMPLANT
DRAIN PENROSE 1/2X12 LTX STRL (WOUND CARE) IMPLANT
DRAPE LAPAROSCOPIC ABDOMINAL (DRAPES) IMPLANT
DRAPE LAPAROTOMY TRNSV 102X78 (DRAPE) ×1 IMPLANT
DRAPE UTILITY 15X26 W/TAPE STR (DRAPE) ×4 IMPLANT
DRSG TEGADERM 4X4.75 (GAUZE/BANDAGES/DRESSINGS) ×2 IMPLANT
ELECT CAUTERY BLADE 6.4 (BLADE) ×2 IMPLANT
ELECT REM PT RETURN 9FT ADLT (ELECTROSURGICAL) ×2
ELECTRODE REM PT RTRN 9FT ADLT (ELECTROSURGICAL) ×1 IMPLANT
GAUZE SPONGE 4X4 16PLY XRAY LF (GAUZE/BANDAGES/DRESSINGS) ×2 IMPLANT
GLOVE BIO SURGEON STRL SZ7 (GLOVE) ×2 IMPLANT
GLOVE BIO SURGEON STRL SZ7.5 (GLOVE) ×1 IMPLANT
GLOVE BIOGEL PI IND STRL 7.0 (GLOVE) IMPLANT
GLOVE BIOGEL PI IND STRL 7.5 (GLOVE) ×1 IMPLANT
GLOVE BIOGEL PI INDICATOR 7.0 (GLOVE) ×2
GLOVE BIOGEL PI INDICATOR 7.5 (GLOVE) ×2
GLOVE SURG SS PI 7.0 STRL IVOR (GLOVE) ×1 IMPLANT
GOWN STRL NON-REIN LRG LVL3 (GOWN DISPOSABLE) ×5 IMPLANT
KIT BASIN OR (CUSTOM PROCEDURE TRAY) ×2 IMPLANT
KIT ROOM TURNOVER OR (KITS) ×2 IMPLANT
MESH PARIETEX PROGRIP RIGHT (Mesh General) ×1 IMPLANT
NDL HYPO 25GX1X1/2 BEV (NEEDLE) ×1 IMPLANT
NEEDLE HYPO 25GX1X1/2 BEV (NEEDLE) ×2 IMPLANT
NS IRRIG 1000ML POUR BTL (IV SOLUTION) ×2 IMPLANT
PACK SURGICAL SETUP 50X90 (CUSTOM PROCEDURE TRAY) ×2 IMPLANT
PAD ARMBOARD 7.5X6 YLW CONV (MISCELLANEOUS) ×2 IMPLANT
PENCIL BUTTON HOLSTER BLD 10FT (ELECTRODE) ×2 IMPLANT
SPECIMEN JAR SMALL (MISCELLANEOUS) ×1 IMPLANT
SPONGE GAUZE 4X4 12PLY (GAUZE/BANDAGES/DRESSINGS) ×2 IMPLANT
SPONGE INTESTINAL PEANUT (DISPOSABLE) ×2 IMPLANT
STRIP CLOSURE SKIN 1/2X4 (GAUZE/BANDAGES/DRESSINGS) ×2 IMPLANT
SUT MNCRL AB 4-0 PS2 18 (SUTURE) ×2 IMPLANT
SUT PDS AB 0 CT 36 (SUTURE) IMPLANT
SUT PROLENE 2 0 SH DA (SUTURE) IMPLANT
SUT SILK 2 0 SH (SUTURE) IMPLANT
SUT SILK 3 0 (SUTURE) ×2
SUT SILK 3-0 18XBRD TIE 12 (SUTURE) ×1 IMPLANT
SUT VIC AB 0 CT2 27 (SUTURE) ×2 IMPLANT
SUT VIC AB 2-0 SH 27 (SUTURE) ×2
SUT VIC AB 2-0 SH 27X BRD (SUTURE) ×1 IMPLANT
SUT VIC AB 3-0 SH 27 (SUTURE) ×2
SUT VIC AB 3-0 SH 27XBRD (SUTURE) ×1 IMPLANT
SYR CONTROL 10ML LL (SYRINGE) ×2 IMPLANT
TOWEL OR 17X24 6PK STRL BLUE (TOWEL DISPOSABLE) ×2 IMPLANT
TOWEL OR 17X26 10 PK STRL BLUE (TOWEL DISPOSABLE) ×2 IMPLANT
TOWEL OR NON WOVEN STRL DISP B (DISPOSABLE) ×1 IMPLANT

## 2013-09-25 NOTE — Anesthesia Postprocedure Evaluation (Signed)
Anesthesia Post Note  Patient: Clayton [name]  Procedure(s) Performed: Procedure(s) (LRB): HERNIA REPAIR INGUINAL ADULT (Right) INSERTION OF MESH (Right)  Anesthesia type: General  Patient location: PACU  Post pain: Pain level controlled  Post assessment: Patient's Cardiovascular Status Stable  Last Vitals:  Filed Vitals:   09/25/13 0945  BP: 182/75  Pulse: 66  Temp: 36.4 C  Resp: 15    Post vital signs: Reviewed and stable  Level of consciousness: alert  Complications: No apparent anesthesia complications

## 2013-09-25 NOTE — Op Note (Signed)
Hernia, Open, Procedure Note  Indications: The patient presented with a history of a right, reducible inguinal hernia.    Pre-operative Diagnosis: right reducible inguinal hernia Post-operative Diagnosis: same  Surgeon: Wynona Luna.   Assistants: none  Anesthesia: General LMA anesthesia  ASA Class: 3  Procedure Details  The patient was seen again in the Holding Room. The risks, benefits, complications, treatment options, and expected outcomes were discussed with the patient. The possibilities of reaction to medication, pulmonary aspiration, perforation of viscus, bleeding, recurrent infection, the need for additional procedures, and development of a complication requiring transfusion or further operation were discussed with the patient and/or family. The likelihood of success in repairing the hernia and returning the patient to their previous functional status is good.  There was concurrence with the proposed plan, and informed consent was obtained. The site of surgery was properly noted/marked. The patient was taken to the Operating Room, identified as Evonte T Emrich, and the procedure verified as right inguinal hernia repair. A Time Out was held and the above information confirmed.  The patient was placed in the supine position and underwent induction of anesthesia. The lower abdomen and groin was prepped with Chloraprep and draped in the standard fashion, and 0.25% Marcaine with epinephrine was used to anesthetize the skin over the mid-portion of the inguinal canal. An oblique incision was made. Dissection was carried down through the subcutaneous tissue with cautery to the external oblique fascia.  We opened the external oblique fascia along the direction of its fibers to the external ring.  The spermatic cord was circumferentially dissected bluntly and retracted with a Penrose drain.  The floor of the inguinal canal was inspected and had a small direct hernia defect at the pubic tubercle.  The  floor of the canal was closed with 0 Vicryl.  We skeletonized the spermatic cord and identified a very large, very thin indirect hernia sac containing bowel.  We dissected the hernia sac free from the spermatic cord, ligated the sac with 0 Vicryl, and excised the excess hernia sac.  We used a right Progrip mesh which was inserted and deployed across the floor of the inguinal canal. The mesh was tucked underneath the external oblique fascia laterally.  The flap of the mesh was closed around the spermatic cord to recreate the internal inguinal ring.  The mesh was secured to the pubic tubercle with 0 Vicryl.  The external oblique fascia was reapproximated with 2-0 Vicryl.  3-0 Vicryl was used to close the subcutaneous tissues and 4-0 Monocryl was used to close the skin in subcuticular fashion.  Benzoin and steri-strips were used to seal the incision.  A clean dressing was applied.  The patient was then extubated and brought to the recovery room in stable condition.  All sponge, instrument, and needle counts were correct prior to closure and at the conclusion of the case.   Estimated Blood Loss: Minimal                 Complications: None; patient tolerated the procedure well.         Disposition: PACU - hemodynamically stable.         Condition: stable  Wilmon Arms. Corliss Skains, MD, [facility]  General/ Trauma Surgery  09/25/2013 8:42 AM

## 2013-09-25 NOTE — Telephone Encounter (Signed)
Pharmacist at [facility])  is recommending Lovenox 40mg  qd until patient's INR is therapeutic on Coumadin - due to his mitral valve.  Please advise.
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
## 2013-09-25 NOTE — Transfer of Care (Signed)
Immediate Anesthesia Transfer of Care Note  Patient: Clayton [name]  Procedure(s) Performed: Procedure(s): HERNIA REPAIR INGUINAL ADULT (Right) INSERTION OF MESH (Right)  Patient Location: PACU  Anesthesia Type:General  Level of Consciousness: awake, oriented, sedated, patient cooperative and responds to stimulation  Airway & Oxygen Therapy: Patient Spontanous Breathing and Patient connected to nasal cannula oxygen  Post-op Assessment: Report given to PACU RN, Post -op Vital signs reviewed and stable and Patient moving all extremities X 4  Post vital signs: Reviewed and stable  Complications: No apparent anesthesia complications

## 2013-09-25 NOTE — Anesthesia Preprocedure Evaluation (Signed)
Anesthesia Evaluation  Patient identified by MRN, date of birth, ID band Patient awake    Reviewed: Allergy & Precautions, H&P , NPO status , Patient's Chart, lab work & pertinent test results, reviewed documented beta blocker date and time   Airway Mallampati: II TM Distance: >3 FB Neck ROM: full    Dental   Pulmonary former smoker,  breath sounds clear to auscultation        Cardiovascular hypertension, On Medications and On Home Beta Blockers + CAD and + Peripheral Vascular Disease + Valvular Problems/Murmurs AS Rhythm:regular     Neuro/Psych negative neurological ROS  negative psych ROS   GI/Hepatic Neg liver ROS, hiatal hernia, GERD-  ,  Endo/Other  negative endocrine ROS  Renal/GU negative Renal ROS  negative genitourinary   Musculoskeletal   Abdominal   Peds  Hematology negative hematology ROS (+)   Anesthesia Other Findings See surgeon's H&P   Reproductive/Obstetrics negative OB ROS                           Anesthesia Physical Anesthesia Plan  ASA: III  Anesthesia Plan: General   Post-op Pain Management:    Induction: Intravenous  Airway Management Planned: LMA  Additional Equipment:   Intra-op Plan:   Post-operative Plan: Extubation in OR  Informed Consent: I have reviewed the patients History and Physical, chart, labs and discussed the procedure including the risks, benefits and alternatives for the proposed anesthesia with the patient or authorized representative who has indicated his/her understanding and acceptance.   Dental Advisory Given  Plan Discussed with: CRNA and Surgeon  Anesthesia Plan Comments:         Anesthesia Quick Evaluation

## 2013-09-25 NOTE — Addendum Note (Signed)
Addendum created 09/25/13 1023 by Melina Schools, CRNA   Modules edited: Anesthesia Flowsheet

## 2013-09-26 LAB — BASIC METABOLIC PANEL
BUN: 24 mg/dL — ABNORMAL HIGH (ref 6–23)
CO2: 27 mEq/L (ref 19–32)
GFR calc Af Amer: 53 mL/min — ABNORMAL LOW (ref >90)
GFR calc non Af Amer: 45 mL/min — ABNORMAL LOW (ref >90)

## 2013-09-26 LAB — PROTIME-INR
INR: 1.38 (ref 0.00–1.49)
Prothrombin Time: 16.6 seconds — ABNORMAL HIGH (ref 11.6–15.2)

## 2013-09-26 LAB — CBC
Hemoglobin: 11.2 g/dL — ABNORMAL LOW (ref 13.0–17.0)
MCH: 30.4 pg (ref 26.0–34.0)
MCV: 93.8 fL (ref 78.0–100.0)
RBC: 3.69 MIL/uL — ABNORMAL LOW (ref 4.22–5.81)

## 2013-09-26 MED ORDER — HYDROCODONE-ACETAMINOPHEN 5-325 MG PO TABS: 1.0000 | ORAL_TABLET | ORAL | Status: DC | PRN

## 2013-09-26 NOTE — Progress Notes (Signed)
Discharge instructions reviewed with patient and wife, who is the primary caregiver. Wife able to verbalize incision site care, when to notify M.D. And plan for follow-up appointment. Prescription and printed AVS given to wife. Questions answered. Awaiting ride.

## 2013-09-26 NOTE — Discharge Summary (Signed)
Physician Discharge Summary  Patient ID: Clayton [name] MRN: [id number] DOB/AGE: 12-Aug-1926 79 y.o.  Admit date: 09/25/2013 Discharge date: 09/26/2013  Admission Diagnoses:Right inguinal hernia  Discharge Diagnoses: Right inguinal hernia Active Problems:   * No active hospital problems. *   Discharged Condition: good  Hospital Course: Right inguinal hernia repair with mesh on 09/25/13.  Kept overnight because of cardiac history and other comorbidities.  Did well.  Good appetite.  Minimal pain  Consults: None  Significant Diagnostic Studies: none  Treatments: surgery: RIH repair  Discharge Exam: Blood pressure 162/69, pulse 66, temperature 97.6 F (36.4 C), temperature source Oral, resp. rate 20, height 5\' 8"  (1.727 m), weight 188 lb 11.1 oz (85.59 kg), SpO2 94.00%. General appearance: alert, cooperative and no distress Dressing dry; minimal swelling; no hematoma or ecchymosis  Disposition: Discharge home  Discharge Orders   Future Appointments Provider Department Dept Phone   10/13/2013 10:40 AM Wilmon Arms. Carnella Fryman, MD [facility], [place] [phone]   Future Orders Complete By Expires   Call MD for:  persistant nausea and vomiting  As directed    Call MD for:  redness, tenderness, or signs of infection (pain, swelling, redness, odor or green/yellow discharge around incision site)  As directed    Call MD for:  severe uncontrolled pain  As directed    Call MD for:  temperature >100.4  As directed    Diet general  As directed    Driving Restrictions  As directed    Comments:     Do not drive while taking pain medications   Increase activity slowly  As directed    May shower / Bathe  As directed    May walk up steps  As directed        Medication List         aspirin EC 81 MG tablet  Take 81 mg by mouth daily.     dorzolamide-timolol 22.3-6.8 MG/ML ophthalmic solution  Commonly known as:  COSOPT  Place 1 drop into both eyes every morning.     HYDROcodone-acetaminophen 5-325 MG per tablet  Commonly known as:  NORCO/VICODIN  Take 1-2 tablets by mouth every 4 (four) hours as needed for moderate pain.     latanoprost 0.005 % ophthalmic solution  Commonly known as:  XALATAN  Place 1 drop into both eyes at bedtime.     lisinopril 10 MG tablet  Commonly known as:  PRINIVIL,ZESTRIL  Take 10 mg by mouth daily.     meclizine 25 MG tablet  Commonly known as:  ANTIVERT  Take 25 mg by mouth 3 (three) times daily as needed for dizziness or nausea.     metoprolol 100 MG tablet  Commonly known as:  LOPRESSOR  Take 50-100 mg by mouth 2 (two) times daily. 100mg  qam and 50mg  qhs     multivitamin-lutein Caps capsule  Take 1 capsule by mouth 2 (two) times daily.     omeprazole 20 MG capsule  Commonly known as:  PRILOSEC  Take 20 mg by mouth daily.     tamsulosin 0.4 MG Caps capsule  Commonly known as:  FLOMAX  Take 0.4 mg by mouth daily.     warfarin 6 MG tablet  Commonly known as:  COUMADIN  Take 6-7 mg by mouth every other day. Alternates 6mg  one day and 7 mg the next day, etc.           Follow-up Information   Follow up with Wynona Luna., MD. Schedule  an appointment as soon as possible for a visit in 3 weeks.   Specialty:  General Surgery   Contact information:   [address] [place] [address] [phone]       Signed: Wynona Luna. 09/26/2013, 8:43 AM

## 2013-09-26 NOTE — Progress Notes (Signed)
Patient discharged to home via wheelchair. Accompanied by wife.

## 2013-09-27 ENCOUNTER — Encounter (HOSPITAL_COMMUNITY): Payer: Self-pay | Admitting: Surgery

## 2013-10-13 ENCOUNTER — Ambulatory Visit (INDEPENDENT_AMBULATORY_CARE_PROVIDER_SITE_OTHER): Payer: Medicare Other | Admitting: Surgery

## 2013-10-13 ENCOUNTER — Encounter (INDEPENDENT_AMBULATORY_CARE_PROVIDER_SITE_OTHER): Payer: Self-pay

## 2013-10-13 ENCOUNTER — Encounter (INDEPENDENT_AMBULATORY_CARE_PROVIDER_SITE_OTHER): Payer: Self-pay | Admitting: Surgery

## 2013-10-13 VITALS — BP 140/90 | HR 60 | Temp 97.4°F | Resp 14 | Ht 68.0 in | Wt 190.0 lb

## 2013-10-13 DIAGNOSIS — K4090 Unilateral inguinal hernia, without obstruction or gangrene, not specified as recurrent: Secondary | ICD-10-CM

## 2013-10-13 NOTE — Progress Notes (Signed)
Status post right inguinal hernia repair with mesh on 09/25/13 for an indirect hernia. The patient reports that he had no pain after surgery. His incision is well healed with no sign of infection or hematoma. No sign of recurrent hernia.  He may resume a normal level of activity, which for him is fairly limited. Followup as needed.  Wilmon Arms. Corliss Skains, MD, [facility] Surgery  General/ Trauma Surgery  10/13/2013 10:05 AM

## 2013-10-17 ENCOUNTER — Observation Stay (HOSPITAL_COMMUNITY)
Admission: EM | Admit: 2013-10-17 | Discharge: 2013-10-20 | Disposition: A | Discharge status: Skilled Nursing Facility | Payer: Medicare Other | Attending: Internal Medicine | Admitting: Internal Medicine

## 2013-10-17 ENCOUNTER — Emergency Department (HOSPITAL_COMMUNITY): Payer: Medicare Other

## 2013-10-17 ENCOUNTER — Encounter (HOSPITAL_COMMUNITY): Payer: Self-pay | Admitting: Emergency Medicine

## 2013-10-17 DIAGNOSIS — I10 Essential (primary) hypertension: Secondary | ICD-10-CM | POA: Insufficient documentation

## 2013-10-17 DIAGNOSIS — W19XXXA Unspecified fall, initial encounter: Secondary | ICD-10-CM

## 2013-10-17 DIAGNOSIS — E538 Deficiency of other specified B group vitamins: Secondary | ICD-10-CM | POA: Insufficient documentation

## 2013-10-17 DIAGNOSIS — Z7901 Long term (current) use of anticoagulants: Secondary | ICD-10-CM | POA: Insufficient documentation

## 2013-10-17 DIAGNOSIS — E785 Hyperlipidemia, unspecified: Secondary | ICD-10-CM

## 2013-10-17 DIAGNOSIS — R5381 Other malaise: Principal | ICD-10-CM | POA: Insufficient documentation

## 2013-10-17 DIAGNOSIS — H547 Unspecified visual loss: Secondary | ICD-10-CM | POA: Insufficient documentation

## 2013-10-17 DIAGNOSIS — Y9279 Other farm location as the place of occurrence of the external cause: Secondary | ICD-10-CM | POA: Insufficient documentation

## 2013-10-17 DIAGNOSIS — Z951 Presence of aortocoronary bypass graft: Secondary | ICD-10-CM | POA: Insufficient documentation

## 2013-10-17 DIAGNOSIS — Y9301 Activity, walking, marching and hiking: Secondary | ICD-10-CM | POA: Insufficient documentation

## 2013-10-17 DIAGNOSIS — Z954 Presence of other heart-valve replacement: Secondary | ICD-10-CM

## 2013-10-17 DIAGNOSIS — R071 Chest pain on breathing: Secondary | ICD-10-CM | POA: Insufficient documentation

## 2013-10-17 DIAGNOSIS — W010XXA Fall on same level from slipping, tripping and stumbling without subsequent striking against object, initial encounter: Secondary | ICD-10-CM | POA: Insufficient documentation

## 2013-10-17 DIAGNOSIS — R262 Difficulty in walking, not elsewhere classified: Secondary | ICD-10-CM | POA: Insufficient documentation

## 2013-10-17 DIAGNOSIS — M549 Dorsalgia, unspecified: Secondary | ICD-10-CM | POA: Insufficient documentation

## 2013-10-17 DIAGNOSIS — I359 Nonrheumatic aortic valve disorder, unspecified: Secondary | ICD-10-CM

## 2013-10-17 DIAGNOSIS — R531 Weakness: Secondary | ICD-10-CM

## 2013-10-17 DIAGNOSIS — I2510 Atherosclerotic heart disease of native coronary artery without angina pectoris: Secondary | ICD-10-CM | POA: Diagnosis present

## 2013-10-17 DIAGNOSIS — Z87891 Personal history of nicotine dependence: Secondary | ICD-10-CM | POA: Insufficient documentation

## 2013-10-17 DIAGNOSIS — Z01818 Encounter for other preprocedural examination: Secondary | ICD-10-CM

## 2013-10-17 DIAGNOSIS — K4090 Unilateral inguinal hernia, without obstruction or gangrene, not specified as recurrent: Secondary | ICD-10-CM

## 2013-10-17 DIAGNOSIS — I25810 Atherosclerosis of coronary artery bypass graft(s) without angina pectoris: Secondary | ICD-10-CM

## 2013-10-17 DIAGNOSIS — Z7982 Long term (current) use of aspirin: Secondary | ICD-10-CM | POA: Insufficient documentation

## 2013-10-17 DIAGNOSIS — R29898 Other symptoms and signs involving the musculoskeletal system: Secondary | ICD-10-CM

## 2013-10-17 LAB — CBC WITH DIFFERENTIAL/PLATELET
Basophils Relative: 0 % (ref 0–1)
Eosinophils Absolute: 0.1 10*3/uL (ref 0.0–0.7)
Lymphs Abs: 2.1 10*3/uL (ref 0.7–4.0)
MCH: 30.2 pg (ref 26.0–34.0)
MCHC: 32.9 g/dL (ref 30.0–36.0)
Neutro Abs: 8.3 10*3/uL — ABNORMAL HIGH (ref 1.7–7.7)
Neutrophils Relative %: 70 % (ref 43–77)
Platelets: 180 10*3/uL (ref 150–400)
RBC: 4.04 MIL/uL — ABNORMAL LOW (ref 4.22–5.81)

## 2013-10-17 LAB — BASIC METABOLIC PANEL
Chloride: 104 mEq/L (ref 96–112)
GFR calc Af Amer: 49 mL/min — ABNORMAL LOW (ref >90)
GFR calc non Af Amer: 42 mL/min — ABNORMAL LOW (ref >90)
Glucose, Bld: 113 mg/dL — ABNORMAL HIGH (ref 70–99)
Potassium: 3.8 mEq/L (ref 3.5–5.1)
Sodium: 138 mEq/L (ref 135–145)

## 2013-10-17 NOTE — ED Notes (Signed)
Pt.s daughter reported that pt. layed out in the field for 4 hours.  Reported to Dr. Criss Alvine, no orders received

## 2013-10-17 NOTE — ED Notes (Signed)
Pt taken to xray 

## 2013-10-17 NOTE — ED Notes (Signed)
Pt. Fell while walking in the field  Today, got off balance and fell.  Denies hitting his head / Pt. Reports having pain right under his lt. Breast bone with deep inspiration

## 2013-10-18 ENCOUNTER — Encounter (HOSPITAL_COMMUNITY): Payer: Self-pay | Admitting: General Practice

## 2013-10-18 ENCOUNTER — Emergency Department (HOSPITAL_COMMUNITY): Payer: Medicare Other

## 2013-10-18 DIAGNOSIS — I359 Nonrheumatic aortic valve disorder, unspecified: Secondary | ICD-10-CM

## 2013-10-18 DIAGNOSIS — Y92009 Unspecified place in unspecified non-institutional (private) residence as the place of occurrence of the external cause: Secondary | ICD-10-CM

## 2013-10-18 DIAGNOSIS — W19XXXA Unspecified fall, initial encounter: Secondary | ICD-10-CM

## 2013-10-18 DIAGNOSIS — R531 Weakness: Secondary | ICD-10-CM | POA: Diagnosis present

## 2013-10-18 DIAGNOSIS — R29898 Other symptoms and signs involving the musculoskeletal system: Secondary | ICD-10-CM

## 2013-10-18 LAB — PROTIME-INR
INR: 2.87 — ABNORMAL HIGH (ref 0.00–1.49)
Prothrombin Time: 29.1 seconds — ABNORMAL HIGH (ref 11.6–15.2)

## 2013-10-18 LAB — URINALYSIS, ROUTINE W REFLEX MICROSCOPIC
Leukocytes, UA: NEGATIVE
Nitrite: NEGATIVE
Protein, ur: 30 mg/dL — AB
Urobilinogen, UA: 0.2 mg/dL (ref 0.0–1.0)

## 2013-10-18 LAB — CK: Total CK: 432 U/L — ABNORMAL HIGH (ref 7–232)

## 2013-10-18 LAB — URINE MICROSCOPIC-ADD ON

## 2013-10-18 MED ORDER — ASPIRIN EC 81 MG PO TBEC
81.0000 mg | DELAYED_RELEASE_TABLET | Freq: Every day | ORAL | Status: DC
  Administered 2013-10-18 – 2013-10-20 (×3): 81 mg via ORAL
  Filled 2013-10-18 (×4): qty 1

## 2013-10-18 MED ORDER — MECLIZINE HCL 25 MG PO TABS
25.0000 mg | ORAL_TABLET | Freq: Every day | ORAL | Status: DC | PRN
  Filled 2013-10-18: qty 1

## 2013-10-18 MED ORDER — LATANOPROST 0.005 % OP SOLN
1.0000 [drp] | Freq: Every day | OPHTHALMIC | Status: DC
Start: 2013-10-18 — End: 2013-10-20
  Administered 2013-10-18 – 2013-10-19 (×2): 1 [drp] via OPHTHALMIC
  Filled 2013-10-18: qty 2.5

## 2013-10-18 MED ORDER — METOPROLOL TARTRATE 100 MG PO TABS
100.0000 mg | ORAL_TABLET | Freq: Every day | ORAL | Status: DC
  Administered 2013-10-18 – 2013-10-20 (×3): 100 mg via ORAL
  Filled 2013-10-18 (×4): qty 1

## 2013-10-18 MED ORDER — OCUVITE-LUTEIN PO CAPS
1.0000 | ORAL_CAPSULE | Freq: Two times a day (BID) | ORAL | Status: DC
  Filled 2013-10-18 (×2): qty 1

## 2013-10-18 MED ORDER — PROSIGHT PO TABS
1.0000 | ORAL_TABLET | Freq: Two times a day (BID) | ORAL | Status: DC
  Administered 2013-10-18 – 2013-10-20 (×5): 1 via ORAL
  Filled 2013-10-18 (×7): qty 1

## 2013-10-18 MED ORDER — GADOBENATE DIMEGLUMINE 529 MG/ML IV SOLN
15.0000 mL | Freq: Once | INTRAVENOUS | Status: AC
  Administered 2013-10-18: 15 mL via INTRAVENOUS

## 2013-10-18 MED ORDER — TAMSULOSIN HCL 0.4 MG PO CAPS
0.4000 mg | ORAL_CAPSULE | Freq: Every day | ORAL | Status: DC
  Administered 2013-10-18 – 2013-10-19 (×2): 0.4 mg via ORAL
  Filled 2013-10-18 (×3): qty 1

## 2013-10-18 MED ORDER — SODIUM CHLORIDE 0.9 % IV SOLN
Freq: Once | INTRAVENOUS | Status: AC
  Administered 2013-10-18: 75 mL/h via INTRAVENOUS

## 2013-10-18 MED ORDER — LISINOPRIL 10 MG PO TABS
10.0000 mg | ORAL_TABLET | Freq: Every day | ORAL | Status: DC
  Administered 2013-10-18 – 2013-10-20 (×3): 10 mg via ORAL
  Filled 2013-10-18 (×4): qty 1

## 2013-10-18 MED ORDER — WARFARIN - PHARMACIST DOSING INPATIENT: Freq: Every day | Status: DC

## 2013-10-18 MED ORDER — WARFARIN SODIUM 6 MG PO TABS
7.0000 mg | ORAL_TABLET | Freq: Once | ORAL | Status: AC
  Administered 2013-10-18: 7 mg via ORAL
  Filled 2013-10-18: qty 1

## 2013-10-18 MED ORDER — METOPROLOL TARTRATE 50 MG PO TABS
50.0000 mg | ORAL_TABLET | Freq: Every day | ORAL | Status: DC
  Administered 2013-10-18 – 2013-10-19 (×2): 50 mg via ORAL
  Filled 2013-10-18 (×3): qty 1

## 2013-10-18 MED ORDER — PANTOPRAZOLE SODIUM 40 MG PO TBEC
40.0000 mg | DELAYED_RELEASE_TABLET | Freq: Every day | ORAL | Status: DC
  Administered 2013-10-18 – 2013-10-20 (×3): 40 mg via ORAL
  Filled 2013-10-18 (×3): qty 1

## 2013-10-18 MED ORDER — DORZOLAMIDE HCL-TIMOLOL MAL 2-0.5 % OP SOLN
1.0000 [drp] | Freq: Every day | OPHTHALMIC | Status: DC
  Administered 2013-10-18 – 2013-10-20 (×3): 1 [drp] via OPHTHALMIC
  Filled 2013-10-18 (×2): qty 10

## 2013-10-18 NOTE — Evaluation (Signed)
Occupational Therapy Evaluation Patient Details Name: Clayton [name] MRN: [id number] DOB: 05/30/1926 Today's Date: 10/18/2013 Time: 4098-1191 OT Time Calculation (min): 37 min  OT Assessment / Plan / Recommendation History of present illness pt fell out walking in the field near his home and has since had Bil LE weakness.     Clinical Impression   Pt demos decline in function with ADLs and ADL mobility safety with decreased strength, balance and endurance and would benefit from acute OT services to address impairments to increase level of function and safety. Uncertain if pt's wife can provide current level of care at this time and she is very concerned about this and would like to have more therapy and sup/assist after acute care d/c. Recommend additional OT/PT after acute care d/c to increase independence and safety    OT Assessment  Patient needs continued OT Services    Follow Up Recommendations  SNF;Supervision/Assistance - 24 hour    Barriers to Discharge Decreased caregiver support Uncertain is pt's wife will be able to provide current level of care. Pt's wife is concerned about providing care for him at home by herself  Equipment Recommendations  Other (comment);Tub/shower bench;3 in 1 bedside comode (TBD)    Recommendations for Other Services    Frequency  Min 2X/week    Precautions / Restrictions Precautions Precautions: Fall Restrictions Weight Bearing Restrictions: No   Pertinent Vitals/Pain No c/o pain    ADL  Grooming: Performed;Wash/dry hands;Wash/dry face;Supervision/safety;Set up Where Assessed - Grooming: Unsupported sitting Upper Body Bathing: Simulated;Supervision/safety;Set up Lower Body Bathing: Simulated;Moderate assistance Upper Body Dressing: Performed;Supervision/safety;Set up Where Assessed - Upper Body Dressing: Unsupported sitting Lower Body Dressing: Performed;Moderate assistance Toilet Transfer: Performed;Minimal assistance Toilet Transfer  Method: Sit to stand Toilet Transfer Equipment: Bedside commode Toileting - Clothing Manipulation and Hygiene: Performed;Moderate assistance Where Assessed - Toileting Clothing Manipulation and Hygiene: Standing Tub/Shower Transfer Method: Not assessed Equipment Used: Gait belt;Other (comment) (BSC) Transfers/Ambulation Related to ADLs: cues for safety, correct hand placement and technique, cues for direction due to visual deficits    OT Diagnosis: Generalized weakness  OT Problem List: Decreased strength;Decreased knowledge of use of DME or AE;Decreased activity tolerance;Impaired balance (sitting and/or standing) OT Treatment Interventions: Self-care/ADL training;Therapeutic exercise;Patient/family education;Neuromuscular education;Balance training;Therapeutic activities;DME and/or AE instruction   OT Goals(Current goals can be found in the care plan section) Acute Rehab OT Goals Patient Stated Goal: to return home OT Goal Formulation: With patient/family Time For Goal Achievement: 10/25/13 Potential to Achieve Goals: Good ADL Goals Pt Will Perform Grooming: with min assist;with min guard assist;standing Pt Will Perform Lower Body Bathing: with min assist;sit to/from stand Pt Will Perform Lower Body Dressing: with min assist;sit to/from stand Pt Will Transfer to Toilet: with min guard assist;with supervision;bedside commode;grab bars;ambulating;regular height toilet Pt Will Perform Toileting - Clothing Manipulation and hygiene: with min assist;sit to/from stand  Visit Information  Last OT Received On: 10/18/13 Assistance Needed: +1 History of Present Illness: pt fell out walking in the field near his home and has since had Bil LE weakness.         Prior Functioning     Home Living Family/patient expects to be discharged to:: Private residence Living Arrangements: Spouse/significant other Available Help at Discharge: Family;Available PRN/intermittently Type of Home:  House Home Access: Stairs to enter Entrance Stairs-Number of Steps: 3 Home Layout: One level Home Equipment: Cane - single point;Walker - 2 wheels;Walker - 4 wheels Prior Function Level of Independence: Needs assistance Gait / Transfers Assistance Needed: pt  occasionally used a cane for mobility, but wife states he didn't always use it.   ADL's / Homemaking Assistance Needed: Wife performs most homemaking tasks.  Pt was able to perform ADLs  Communication Communication: HOH Dominant Hand: Right         Vision/Perception Vision - History Baseline Vision: Wears glasses all the time Visual History: Macular degeneration Patient Visual Report: No change from baseline Perception Perception: Within Functional Limits   Cognition  Cognition Arousal/Alertness: Awake/alert Behavior During Therapy: WFL for tasks assessed/performed Overall Cognitive Status: Within Functional Limits for tasks assessed    Extremity/Trunk Assessment Upper Extremity Assessment Upper Extremity Assessment: Overall WFL for tasks assessed;Generalized weakness;LUE deficits/detail LUE Deficits / Details: Pt ha injury at 80 years old 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 with decreased ROM and function, but was still able to function Independently Lower Extremity Assessment Lower Extremity Assessment: Defer to PT evaluation Cervical / Trunk Assessment Cervical / Trunk Assessment: Kyphotic     Mobility Bed Mobility Bed Mobility: Sit to Supine;Scooting to HOB Supine to Sit: 4: Min assist;HOB elevated Sitting - Scoot to Edge of Bed: 5: Supervision Sit to Supine: 4: Min guard Scooting to HOB: 4: Min assist Details for Bed Mobility Assistance: pt in recliner upon entering rooom Transfers Transfers: Sit to Stand;Stand to Sit Sit to Stand: 4: Min assist;With upper extremity assist;From bed;From chair/3-in-1 Stand to Sit: 4: Min guard;With upper extremity assist;To chair/3-in-1;To bed Details for Transfer Assistance:  cues for safety, correct hand placement and technique, cues for direction due to visual deficits     Exercise     Balance Balance Balance Assessed: Yes Dynamic Sitting Balance Dynamic Sitting - Balance Support: No upper extremity supported;Feet unsupported;During functional activity Dynamic Sitting - Level of Assistance: 5: Stand by assistance Static Standing Balance Static Standing - Balance Support: Right upper extremity supported Static Standing - Level of Assistance: 4: Min assist Static Standing - Comment/# of Minutes: pt unsteady and fatigues easily Dynamic Standing Balance Dynamic Standing - Balance Support: Left upper extremity supported;During functional activity Dynamic Standing - Level of Assistance: 3: Mod assist   End of Session OT - End of Session Equipment Utilized During Treatment: Gait belt;Other (comment) (BSC) Activity Tolerance: Patient limited by fatigue Patient left: in bed;with call bell/phone within reach;with family/visitor present  GO Functional Limitation: Self care Self Care Current Status (Z6109): At least 40 percent but less than 60 percent impaired, limited or restricted Self Care Goal Status (U0454): At least 20 percent but less than 40 percent impaired, limited or restricted   Galen Manila 10/18/2013, 2:57 PM

## 2013-10-18 NOTE — ED Notes (Signed)
Pt back from [facility]

## 2013-10-18 NOTE — Evaluation (Signed)
Physical Therapy Evaluation Patient Details Name: Clayton [name] MRN: [id number] DOB: 05-Sep-1926 Today's Date: 10/18/2013 Time: 4098-1191 PT Time Calculation (min): 38 min  PT Assessment / Plan / Recommendation History of Present Illness  pt fell out walking in the field near his home and has since had Bil LE weakness.    Clinical Impression  Pt very motivated to improve mobility to return to home, however at this time pt is only able to ambulate 3' forward and back before feeling like his knees are going to buckle.  At this time safest D/C option is for D/C to SNF for continued rehab prior to returning to home with family that work during the day.  If pt were to D/C to home, would need to consider W/C, HHPT, HHAide, HHOT, and 3-in-1.  Will continue to follow.      PT Assessment  Patient needs continued PT services    Follow Up Recommendations  SNF    Does the patient have the potential to tolerate intense rehabilitation      Barriers to Discharge Decreased caregiver support      Equipment Recommendations  Rolling walker with 5" wheels (With L Platform)    Recommendations for Other Services OT consult   Frequency Min 3X/week    Precautions / Restrictions Precautions Precautions: Fall Restrictions Weight Bearing Restrictions: No   Pertinent Vitals/Pain Indicates Bil knees sore.        Mobility  Bed Mobility Bed Mobility: Supine to Sit;Sitting - Scoot to Edge of Bed Supine to Sit: 4: Min assist;HOB elevated Sitting - Scoot to Edge of Bed: 5: Supervision Details for Bed Mobility Assistance: A with bringing trunk up to sitting.   Transfers Transfers: Sit to Stand;Stand to [employer] Transfers Sit to Stand: 4: Min assist;With upper extremity assist;From bed;From chair/3-in-1 Stand to Sit: 4: Min guard;With upper extremity assist;To chair/3-in-1;To bed Stand Pivot Transfers: 4: Min assist Details for Transfer Assistance: pt initially requiring MinA to come to stand,  however with repeated trasnfer pt able to improve to MinG to stand from recliner with armrests.   Ambulation/Gait Ambulation/Gait Assistance: 4: Min assist Ambulation Distance (Feet): 3 Feet Assistive device: Straight cane Ambulation/Gait Assistance Details: pt fatigues very quickly and feels as though Bil knees will give out.  pt able to amb back and forth 3' x4 and took a sitting rest break in between each.  pt mildly unsteady with each bout of gait.   Gait Pattern: Step-through pattern;Decreased stride length;Trunk flexed Stairs: No Wheelchair Mobility Wheelchair Mobility: No    Exercises     PT Diagnosis: Difficulty walking;Generalized weakness  PT Problem List: Decreased strength;Decreased activity tolerance;Decreased balance;Decreased mobility;Decreased knowledge of use of DME PT Treatment Interventions: DME instruction;Gait training;Stair training;Functional mobility training;Therapeutic activities;Therapeutic exercise;Balance training;Patient/family education     PT Goals(Current goals can be found in the care plan section) Acute Rehab PT Goals Patient Stated Goal: Home PT Goal Formulation: With patient/family Time For Goal Achievement: 11/01/13 Potential to Achieve Goals: Good  Visit Information  Last PT Received On: 10/18/13 Assistance Needed: +1 History of Present Illness: pt fell out walking in the field near his home and has since had Bil LE weakness.         Prior Functioning  Home Living Family/patient expects to be discharged to:: Private residence Living Arrangements: Spouse/significant other Available Help at Discharge: Family;Available PRN/intermittently Type of Home: House Home Access: Stairs to enter Entrance Stairs-Number of Steps: 3 Home Layout: One level Home Equipment: Cane - single  point;Walker - 2 wheels;Walker - 4 wheels Prior Function Level of Independence: Needs assistance Gait / Transfers Assistance Needed: pt occasionally used a cane for  mobility, but wife states he didn't always use it.   ADL's / Homemaking Assistance Needed: Wife performs most homemaking tasks.   Communication Communication: HOH Dominant Hand: Right    Cognition  Cognition Arousal/Alertness: Awake/alert Behavior During Therapy: WFL for tasks assessed/performed Overall Cognitive Status: Within Functional Limits for tasks assessed    Extremity/Trunk Assessment Upper Extremity Assessment Upper Extremity Assessment: Defer to OT evaluation Lower Extremity Assessment Lower Extremity Assessment: Generalized weakness Cervical / Trunk Assessment Cervical / Trunk Assessment: Kyphotic   Balance Balance Balance Assessed: Yes Static Standing Balance Static Standing - Balance Support: Right upper extremity supported Static Standing - Level of Assistance: 4: Min assist Static Standing - Comment/# of Minutes: pt mildly unsteady in standing and has short activity tolerance.    End of Session PT - End of Session Equipment Utilized During Treatment: Gait belt Activity Tolerance: Patient limited by fatigue Patient left: in chair;with call bell/phone within reach;with family/visitor present Nurse Communication: Mobility status  GP Functional Assessment Tool Used: Clinical Judgement Functional Limitation: Mobility: Walking and moving around Mobility: Walking and Moving Around Current Status (Z6109): At least 20 percent but less than 40 percent impaired, limited or restricted Mobility: Walking and Moving Around Goal Status [phone]): 0 percent impaired, limited or restricted   Sunny Schlein, [place] [phone] 10/18/2013, 11:56 AM

## 2013-10-18 NOTE — Care Management Note (Signed)
  Page 1 of 1   10/18/2013     2:45:23 PM   CARE MANAGEMENT NOTE 10/18/2013  Patient:  Clayton [name], Clayton [name]   Account Number:  [id number]  Date Initiated:  10/18/2013  Documentation initiated by:  Ronny Flurry  Subjective/Objective Assessment:     Action/Plan:   Anticipated DC Date:     Anticipated DC Plan:  SKILLED NURSING FACILITY  In-house referral  Clinical Social Worker         Choice offered to / List presented to:             Status of service:   Medicare Important Message given?   (If response is "NO", the following Medicare IM given date fields will be blank) Date Medicare IM given:   Date Additional Medicare IM given:    Discharge Disposition:    Per UR Regulation:    If discussed at Long Length of Stay Meetings, dates discussed:    Comments:  10-18-13 PT recommeding SNF short term . Spoke with patient and wife at bedside , they both want patinet to go to SNF for short term rehab before returning home. SW aware and on way to speak to them .  Wife works , cell phone number is [phone]  Ronny Flurry RN BSN [phone]

## 2013-10-18 NOTE — Progress Notes (Signed)
Clinical Social Work Department BRIEF PSYCHOSOCIAL ASSESSMENT 10/18/2013  Patient:  Clayton [name], Clayton [name]     Account Number:  [id number]     Admit date:  10/17/2013  Clinical Social Worker:  Leron Croak, CLINICAL SOCIAL WORKER  Date/Time:  10/18/2013 03:10 PM  Referred by:  Physician  Date Referred:  10/18/2013 Referred for  SNF Placement   Other Referral:   Interview type:  Patient Other interview type:   Wife was also present in the room    Cari Vandeberg [phone]    PSYCHOSOCIAL DATA Living Status:  WIFE Admitted from facility:   Level of care:   Primary support name:  Luian Schumpert  [phone] Primary support relationship to patient:  SPOUSE Degree of support available:   Wife has physical limitations and Pt needs additional assistance that she feels she may not be able to supply.    CURRENT CONCERNS Current Concerns  Post-Acute Placement   Other Concerns:    SOCIAL WORK ASSESSMENT / PLAN CSW met with the Pt and the wife at the bedside to discuss d/c planning to SNF. Pt's wife had requested that Pt be considered for SNF rehab. CSW introduced self and provided information about facilities in the Pt's county. CSW explained the process for placement and asked for preferrences in their county. Pt's wife stated that her first choice would be [facility] in [place] and she will inform CSW of any additional assitance after CSW brings back bed offers. CSW will proceed for SNF search. CSW also contacted [employer] to start auth process.   Assessment/plan status:  Information/Referral to [facility] Other assessment/ plan:   Information/referral to community resources:   CSW provided a SNF listing in the Pt's county area.    PATIENT'S/FAMILY'S RESPONSE TO PLAN OF CARE: Pt's wife expressed concerns about her physical limitations and time frame that Pt would remain in the facility. CSW answered all questions and concerns and will keep family and Pt  informed of placement options .         Leron Croak [facility]  [address];  [phone] Phone: [phone]

## 2013-10-18 NOTE — ED Notes (Signed)
Pt taken to [facility]

## 2013-10-18 NOTE — ED Provider Notes (Signed)
CSN: [id number]     Arrival date & time 10/17/13  1844 History   First MD Initiated Contact with Patient 10/17/13 2257     Chief Complaint  Patient presents with  . Fall   (Consider location/radiation/quality/duration/timing/severity/associated sxs/prior Treatment) HPI 79 year old male presents to emergency department from home after a fall.  Patient reports he was out walking and feels when he stumbled and fell onto his back.  Per family, patient was lying in the field for up to 4 hours prior to being found.  Patient reports he attempted to stand up and walk home, but was unable to.  He was able to roll over and get up onto his knees, but unable to stand.  Patient has been unable to walk or stand without support on both sides since falling.  He is complaining of pain just under his left breast along the ribs.  Pain is worse with deep breath and palpation He also complains of some mild midthoracic pain.  Patient has history of coronary disease, status post CABG, hypertension, hyperlipidemia.  He has history of aortic valve replacement for which she is on Coumadin.  Patient denies any abdominal pain, no urinary symptoms.  No chest pain, no shortness of breath.  No headache.  Patient did not strike his head, no LOC.  Patient does not want any pain medicine at this time.  Patient is a retired Visual merchandiser. Past Medical History  Diagnosis Date  . CAD (coronary artery disease)     s/p CABG 1999  . HLD (hyperlipidemia)   . HTN (hypertension)   . GERD (gastroesophageal reflux disease)   . H/O hiatal hernia   . Arthritis   . Peripheral vascular disease     aneursym  . Aortic aneurysm     at least 3 cmm infrarenal AAA by lumbar CT 05/22/13 [facility]); 41 mm proximal ascending aorta on echo 05/05/13 [facility])   Past Surgical History  Procedure Laterality Date  . Aortic valve replacement  1998  . Coronary artery bypass graft  1998  . Cholecystectomy    . Tonsillectomy    . US extremity*l*  Left     arm  . Inguinal hernia repair  09/25/2013    with mesh  . Inguinal hernia repair Right 09/25/2013    Procedure: HERNIA REPAIR INGUINAL ADULT;  Surgeon: Wilmon Arms. Corliss Skains, MD;  Location: [facility] OR;  Service: General;  Laterality: Right;  . Insertion of mesh Right 09/25/2013    Procedure: INSERTION OF MESH;  Surgeon: Wilmon Arms. Corliss Skains, MD;  Location: [facility] OR;  Service: General;  Laterality: Right;   Family History  Problem Relation Age of Onset  . Coronary artery disease     History  Substance Use Topics  . Smoking status: Former Games developer  . Smokeless tobacco: Never Used     Comment: quit smoking 45 years ago "  . Alcohol Use: No    Review of Systems  See History of Present Illness; otherwise all other systems are reviewed and negative Allergies  Tetanus toxoids  Home Medications   Current Outpatient Rx  Name  Route  Sig  Dispense  Refill  . aspirin EC 81 MG tablet   Oral   Take 81 mg by mouth daily.         . dorzolamide-timolol (COSOPT) 22.3-6.8 MG/ML ophthalmic solution   Both Eyes   Place 1 drop into both eyes daily.          Marland Kitchen latanoprost (XALATAN) 0.005 % ophthalmic  solution   Both Eyes   Place 1 drop into both eyes at bedtime.          Marland Kitchen lisinopril (PRINIVIL,ZESTRIL) 10 MG tablet   Oral   Take 10 mg by mouth daily.           . meclizine (ANTIVERT) 25 MG tablet   Oral   Take 25 mg by mouth daily as needed for dizziness or nausea.          . metoprolol (LOPRESSOR) 100 MG tablet   Oral   Take 100 mg by mouth daily.          . metoprolol (LOPRESSOR) 50 MG tablet   Oral   Take 50 mg by mouth at bedtime.         . multivitamin-lutein (OCUVITE-LUTEIN) CAPS   Oral   Take 1 capsule by mouth 2 (two) times daily.           Marland Kitchen omeprazole (PRILOSEC) 20 MG capsule   Oral   Take 20 mg by mouth daily.           . Tamsulosin HCl (FLOMAX) 0.4 MG CAPS   Oral   Take 0.4 mg by mouth at bedtime.          Marland Kitchen warfarin (COUMADIN) 1 MG tablet    Oral   Take 1-2 mg by mouth at bedtime. Alternate 6 mg dose (5 mg + 1 mg) with 7 mg dose (5 mg + 1 mg + 1 mg)         . warfarin (COUMADIN) 5 MG tablet   Oral   Take 5 mg by mouth See admin instructions. Daily at bedtime:  Alternate 6 mg dose (5 mg + 1 mg) with 7 mg dose (5 mg + 1 mg+ 1 mg)          BP 136/71  Pulse 87  Temp(Src) 98 F (36.7 C) (Oral)  Resp 26  Ht 5\' 8"  (1.727 m)  Wt 193 lb (87.544 kg)  BMI 29.35 kg/m2  SpO2 93% Physical Exam  Nursing note and vitals reviewed. Constitutional: He is oriented to person, place, and time. He appears well-developed and well-nourished. No distress.  HENT:  Head: Normocephalic and atraumatic.  Nose: Nose normal.  Mouth/Throat: Oropharynx is clear and moist.  Eyes: Conjunctivae and EOM are normal. Pupils are equal, round, and reactive to light. Right eye exhibits no discharge. Left eye exhibits no discharge.  Neck: Normal range of motion. Neck supple. No JVD present. No tracheal deviation present. No thyromegaly present.  Cardiovascular: Normal rate, regular rhythm and intact distal pulses.  Exam reveals no gallop and no friction rub.   Murmur heard. Pulmonary/Chest: Effort normal and breath sounds normal. No stridor. No respiratory distress. He has no wheezes. He has no rales. He exhibits tenderness (mild tenderness to left lower chest wall without step-off, crepitus or ecchymosis noted).  Abdominal: Soft. Bowel sounds are normal. He exhibits no distension. There is no tenderness. There is no rebound and no guarding.  Musculoskeletal: He exhibits tenderness (patient has tenderness to mid thoracic at T10.  There is no step-off or crepitus to this area.  abrasions and erythema to bilateral knees ).  Lymphadenopathy:    He has no cervical adenopathy.  Neurological: He is alert and oriented to person, place, and time. He has normal reflexes. He exhibits abnormal muscle tone. Coordination abnormal.  Patient has weakness of bilateral legs.   When attempting to stand.  He is able to  flex and extend at hip and knee without pain.  Patient is able to lift legs off the bed  Skin: Skin is warm and dry. No rash noted. No erythema. No pallor.  Psychiatric: He has a normal mood and affect. His behavior is normal. Judgment and thought content normal.    ED Course  Procedures (including critical care time) Labs Review Labs Reviewed  PROTIME-INR - Abnormal; Notable for the following:    Prothrombin Time 29.1 (*)    INR 2.87 (*)    All other components within normal limits  CBC WITH DIFFERENTIAL - Abnormal; Notable for the following:    WBC 11.9 (*)    RBC 4.04 (*)    Hemoglobin 12.2 (*)    HCT 37.1 (*)    Neutro Abs 8.3 (*)    Monocytes Absolute 1.4 (*)    All other components within normal limits  BASIC METABOLIC PANEL - Abnormal; Notable for the following:    Glucose, Bld 113 (*)    BUN 26 (*)    Creatinine, Ser 1.46 (*)    GFR calc non Af Amer 42 (*)    GFR calc Af Amer 49 (*)    All other components within normal limits  CK - Abnormal; Notable for the following:    Total CK 432 (*)    All other components within normal limits  URINALYSIS, ROUTINE W REFLEX MICROSCOPIC - Abnormal; Notable for the following:    APPearance CLOUDY (*)    Hgb urine dipstick TRACE (*)    Bilirubin Urine SMALL (*)    Ketones, ur 15 (*)    Protein, ur 30 (*)    All other components within normal limits  URINE MICROSCOPIC-ADD ON - Abnormal; Notable for the following:    Casts HYALINE CASTS (*)    All other components within normal limits   Imaging Review Dg Ribs Unilateral W/chest Left  10/17/2013   CLINICAL DATA:  Left anterior rib pain.  EXAM: LEFT RIBS AND CHEST - 3+ VIEW  COMPARISON:  PA and lateral chest 09/18/2013.  FINDINGS: The patient is status post CABG and aortic valve replacement. The lungs are clear. Heart size is normal. There is no pneumothorax or pleural fluid. No rib fracture is identified. A marker is placed in the region of  concern.  IMPRESSION: No acute abnormality.   Electronically Signed   By: Drusilla Kanner M.D.   On: 10/17/2013 19:35   Dg Thoracic Spine W/swimmers  10/18/2013   CLINICAL DATA:  Status post fall; upper back pain.  Unable to walk.  EXAM: THORACIC SPINE - 2 VIEW + SWIMMERS  COMPARISON:  Chest radiograph performed 09/18/2013  FINDINGS: There is no evidence of fracture or subluxation. Vertebral bodies demonstrate normal height and alignment. Intervertebral disc spaces are preserved along the thoracic spine. There is mild narrowing of the intervertebral disc spaces at the lower cervical spine.  The visualized portions of both lungs are clear. The patient is status post median sternotomy, with evidence of prior CABG. An aortic valve replacement is noted. Clips are noted within the right upper quadrant, reflecting prior cholecystectomy.  IMPRESSION: No evidence of fracture or subluxation along the thoracic spine.   Electronically Signed   By: Roanna Raider M.D.   On: 10/18/2013 00:03   Dg Lumbar Spine Complete  10/18/2013   CLINICAL DATA:  Status post fall; lower back pain and inability to walk.  EXAM: LUMBAR SPINE - COMPLETE 4+ VIEW  COMPARISON:  Lumbar spine radiographs performed  05/08/2013, and CT of the lumbar spine performed 05/22/2013  FINDINGS: There is no evidence of fracture or subluxation. Vertebral bodies demonstrate normal height and alignment. There is chronic narrowing of the intervertebral disc space at L5-S1, with associated sclerotic change. Narrowing of the intervertebral disc space is also noted at L1-L2.  The visualized bowel gas pattern is unremarkable in appearance; air and stool are noted within the colon. The sacroiliac joints are within normal limits. Clips are noted within the right upper quadrant, reflecting prior cholecystectomy.  IMPRESSION: No evidence of fracture or subluxation along the lumbar spine.   Electronically Signed   By: Roanna Raider M.D.   On: 10/18/2013 00:06   Mr  Thoracic Spine W Wo Contrast  10/18/2013   CLINICAL DATA:  Lower extremity weakness after fall.  EXAM: MRI THORACIC SPINE WITHOUT AND WITH CONTRAST  TECHNIQUE: Multiplanar and multiecho pulse sequences of the thoracic spine were obtained without and with intravenous contrast.  CONTRAST:  15mL MULTIHANCE GADOBENATE DIMEGLUMINE 529 MG/ML IV SOLN  COMPARISON:  Thoracic spine radiograph October 17, 2013.  FINDINGS: Mild STIR signal within the right aspect of the T3 vertebral body associated with the minimal height loss, sagittal STIR 5 /13 and axial T1 7/34. Thoracic vertebral bodies and posterior elements are otherwise intact and aligned and maintenance of thoracic kyphosis. Small acute T3 superior endplate Schmorl's node, no suspicious STIR signal to suggest acute fracture. Intervertebral disk morphology is generally preserved with decreased T2 signal within all disks most consistent with moderate desiccation. Mild ventral endplate spurring of the lower thoracic levels. No abnormal osseous nor intradiscal enhancement.  Thoracic spinal cord appears normal in morphology and signal characteristics of the conus medullaris which terminates at L1. No abnormal cord, leptomeningeal nor epidural enhancement. Moderate symmetric paraspinal muscle atrophy. Included prevertebral soft tissues are nonsuspicious.  Partially imaged C7-T1 anterolisthesis result in mild canal stenosis, suspected congenital canal narrowing of the cervical spine on the localizer. Mild C7-T1 acute discogenic endplate changes.  Annular bulging at T2-3, without canal stenosis or neural foraminal narrowing at any thoracic level.  IMPRESSION: Mild abnormal STIR signal within the right aspect of T3 vertebral body, slight height loss, unclear whether this reflects fracture or Schmorl's node and acute discogenic endplate changes.  Mild degenerative change of the thoracic spine without canal stenosis or neural foraminal narrowing.  No abnormal enhancement within  the thoracic spine.  Grade 1 C7-T1 anterolisthesis on apparent degenerative basis, incompletely imaged.   Electronically Signed   By: Awilda Metro   On: 10/18/2013 04:15   Mr Lumbar Spine W Wo Contrast  10/18/2013   CLINICAL DATA:  Lower extremity weakness after fall.  EXAM: MRI LUMBAR SPINE WITHOUT AND WITH CONTRAST  TECHNIQUE: Multiplanar and multiecho pulse sequences of the lumbar spine were obtained without and with intravenous contrast.  CONTRAST:  15mL MULTIHANCE GADOBENATE DIMEGLUMINE 529 MG/ML IV SOLN  COMPARISON:  Lumbar spine radiographs October 17, 2013 and CT of the lumbar spine. May 22, 2013  FINDINGS: The lumbar vertebral bodies and posterior elements remain intact and aligned with maintenance of the lumbar lordosis. Severe L1-2 disc height loss, moderate to severe L5-S1 disc height loss is similar, associated with moderate to severe subacute to chronic discogenic endplate changes. Mild ex to moderate acute on chronic enhancing discogenic endplate change at L2-3. Mild chronic discogenic endplate change at L4-5. Decreased T2 signal within all lumbar disks most consistent with moderate desiccation. Scattered subcentimeter bright T1 and bright T2 foci lose signal on this  fat sat sequence suggesting focal fat, no suspicious osseous nor intradiscal enhancement.  Conus medullaris terminates at L1 and appears normal in morphology and signal characteristics. Cauda equina is unremarkable. No abnormal cord, leptomeningeal nor epidural enhancement.  Bright T1 bone marrow signal in the sacrum most consistent with osteopenia. Moderate symmetric paraspinal muscle atrophy. Ectasia of the infrarenal aorta, measuring 2.8 cm.  Level by level evaluation:  L1-L2: 2 mm broad-based disc osteophyte complex, mild facet arthropathy and ligamentum flavum redundancy without canal stenosis or neural foraminal narrowing.  L2-L3: 4 mm broad-based disk bulge, with mild enhancing annular tear. Mild facet arthropathy and  ligamentum flavum redundancy. Minimal canal stenosis with partial effacement of the lateral recesses which likely affect the traversing L3 nurse. No neural foraminal narrowing.  L3-L4: 3 mm broad-based disk bulge with enhancing annular tear. Mild facet arthropathy and ligamentum flavum redundancy. Minimal canal stenosis, with partial effacement of lateral recesses which may affect the traversing L4 nerve. No neural foraminal narrowing.  L4-L5: 3 mm broad-based disk bulge asymmetric to the right with enhancing annular tear. Mild facet arthropathy and ligamentum flavum redundancy. No canal stenosis. Mild to moderate right neural foraminal narrowing.  L5-S1: No disk bulge. Mild facet arthropathy without canal stenosis. Mild bilateral neural foraminal narrowing.  IMPRESSION: Mild abnormal STIR signal within the right aspect of T3 vertebral body, slight height loss, is unclear whether this reflects fracture or Schmorl's node and acute discogenic endplate changes.  No acute fracture or malalignment. No abnormal enhancement of the lumbar spine.  Degenerative change of lumbar spine with minimal canal stenosis at L2-3 and L3-4. Neural foraminal narrowing L4-5 and L5-S1: Mild to moderate on the right at L4-5.  Multi-level enhancing annular tears.   Electronically Signed   By: Awilda Metro   On: 10/18/2013 04:23    EKG Interpretation    Date/Time:  Tuesday October 17 2013 19:18:45 EST Ventricular Rate:  89 PR Interval:  194 QRS Duration: 90 QT Interval:  376 QTC Calculation: 457 R Axis:   51 Text Interpretation:  Normal sinus rhythm Cannot rule out Anterior infarct , age undetermined Abnormal ECG No previous ECGs available Confirmed by Lavanya Roa  MD, Napoleon Monacelli ([id number]) on 10/18/2013 4:44:39 AM            MDM   1. Fall at home, initial encounter   2. Weakness   3. Unable to walk    79 year old male status post fall earlier today, with prolonged immobilization after the fall.  Patient is on Coumadin.   Initial x-rays of ribs show no fractures in the area of pain to left chest wall.  Patient does have some thoracic tenderness.  He has good range of motion of lower extremities, but is unable to stand, due to weakness.  Concerned given his fall and being on Coumadin, that he may have an occult injury to his spine.  Initial plan is for x-rays looking for fractures.  If no fractures, plan for MRI investigating possible cord injury.  As patient is unable ambulate on his own, patient will need admission, regardless of findings.  Labs sent.  Patient refuses pain medicine at this time.  Family and patient updated on plan for tonight and eventual admission with physical therapy evaluation.    Olivia Mackie, MD 10/18/13 [phone]

## 2013-10-18 NOTE — Progress Notes (Signed)
ANTICOAGULATION CONSULT NOTE - Initial Consult  Pharmacy Consult for Coumadin Indication: AVR  Allergies  Allergen Reactions  . Tetanus Toxoids Hives    Patient Measurements: Height: 5\' 8"  (172.7 cm) Weight: 193 lb (87.544 kg) IBW/kg (Calculated) : 68.4  Vital Signs: Temp: 98 F (36.7 C) (12/09 1858) Temp src: Oral (12/09 1858) BP: 136/71 mmHg (12/10 0200) Pulse Rate: 87 (12/10 0200)  Labs:  Recent Labs  10/17/13 2307 10/17/13 2332  HGB 12.2*  --   HCT 37.1*  --   PLT 180  --   LABPROT 29.1*  --   INR 2.87*  --   CREATININE 1.46*  --   CKTOTAL  --  432*    Estimated Creatinine Clearance: 39.8 ml/min (by C-G formula based on Cr of 1.46).   Medical History: Past Medical History  Diagnosis Date  . CAD (coronary artery disease)     s/p CABG 1999  . HLD (hyperlipidemia)   . HTN (hypertension)   . GERD (gastroesophageal reflux disease)   . H/O hiatal hernia   . Arthritis   . Peripheral vascular disease     aneursym  . Aortic aneurysm     at least 3 cmm infrarenal AAA by lumbar CT 05/22/13 [facility]); 41 mm proximal ascending aorta on echo 05/05/13 [facility])    Medications:   (Not in a hospital admission) Scheduled:  . aspirin EC  81 mg Oral Daily  . dorzolamide-timolol  1 drop Both Eyes Daily  . latanoprost  1 drop Both Eyes QHS  . lisinopril  10 mg Oral Daily  . metoprolol  100 mg Oral Daily  . metoprolol  50 mg Oral QHS  . multivitamin-lutein  1 capsule Oral BID  . pantoprazole  40 mg Oral Daily  . tamsulosin  0.4 mg Oral QHS    Assessment: 79yo male presents to ED s/p fall, denies hitting head though c/o pain under breastbone with inspiration and BLE weakness, to continue Coumadin while admitted for further w/u; admitted w/ therapeutic INR.    Plan:  As dose was missed 12/9, will give home dose of 7mg  today then monitor INR for dose adjustments if needed.  Vernard Gambles, PharmD, BCPS  10/18/2013,5:35 AM

## 2013-10-18 NOTE — H&P (Signed)
[facility] History and Physical  Clayton [name]:[id number] DOB: Nov 30, 1925 DOA: 10/17/2013  Referring physician: ED PCP: Feliciana Rossetti, MD  Chief Complaint: Fall  HPI: ELIYOHU CLASS is a 79 y.o. male who presents to the ED after a fall.  Patient was out walking and he stumbled and fell onto his back.  Per family patient lying in the field for up to 4 hours prior to being found (patient is a retired Visual merchandiser).  Patient reports he tried to stand up and walk home after the fall but was unable to due to BLE weakness in both his legs distal to his knees.  He has been unable to walk or stand without support on both sides since falling.  No LOC, didn't hit his head, does have some back pain.  Work up in the ED including emergent MRI of T spine and L spine was negative.  Review of Systems: 12 systems reviewed and otherwise negative.  Past Medical History  Diagnosis Date  . CAD (coronary artery disease)     s/p CABG 1999  . HLD (hyperlipidemia)   . HTN (hypertension)   . GERD (gastroesophageal reflux disease)   . H/O hiatal hernia   . Arthritis   . Peripheral vascular disease     aneursym  . Aortic aneurysm     at least 3 cmm infrarenal AAA by lumbar CT 05/22/13 [facility]); 41 mm proximal ascending aorta on echo 05/05/13 [facility])   Past Surgical History  Procedure Laterality Date  . Aortic valve replacement  1998  . Coronary artery bypass graft  1998  . Cholecystectomy    . Tonsillectomy    . US extremity*l* Left     arm  . Inguinal hernia repair  09/25/2013    with mesh  . Inguinal hernia repair Right 09/25/2013    Procedure: HERNIA REPAIR INGUINAL ADULT;  Surgeon: Wilmon Arms. Corliss Skains, MD;  Location: [facility] OR;  Service: General;  Laterality: Right;  . Insertion of mesh Right 09/25/2013    Procedure: INSERTION OF MESH;  Surgeon: Wilmon Arms. Corliss Skains, MD;  Location: [facility] OR;  Service: General;  Laterality: Right;   Social History:  reports that he has quit smoking. He has never used  smokeless tobacco. He reports that he does not drink alcohol or use illicit drugs.   Allergies  Allergen Reactions  . Tetanus Toxoids Hives    Family History  Problem Relation Age of Onset  . Coronary artery disease      Prior to Admission medications   Medication Sig Start Date End Date Taking? Authorizing Provider  aspirin EC 81 MG tablet Take 81 mg by mouth daily.   Yes Historical Provider, MD  dorzolamide-timolol (COSOPT) 22.3-6.8 MG/ML ophthalmic solution Place 1 drop into both eyes daily.  07/26/13  Yes Historical Provider, MD  latanoprost (XALATAN) 0.005 % ophthalmic solution Place 1 drop into both eyes at bedtime.  07/26/13  Yes Historical Provider, MD  lisinopril (PRINIVIL,ZESTRIL) 10 MG tablet Take 10 mg by mouth daily.     Yes Historical Provider, MD  meclizine (ANTIVERT) 25 MG tablet Take 25 mg by mouth daily as needed for dizziness or nausea.  05/19/13  Yes Historical Provider, MD  metoprolol (LOPRESSOR) 100 MG tablet Take 100 mg by mouth daily.    Yes Historical Provider, MD  metoprolol (LOPRESSOR) 50 MG tablet Take 50 mg by mouth at bedtime.   Yes Historical Provider, MD  multivitamin-lutein (OCUVITE-LUTEIN) CAPS Take 1 capsule by mouth 2 (two) times  daily.     Yes Historical Provider, MD  omeprazole (PRILOSEC) 20 MG capsule Take 20 mg by mouth daily.     Yes Historical Provider, MD  Tamsulosin HCl (FLOMAX) 0.4 MG CAPS Take 0.4 mg by mouth at bedtime.    Yes Historical Provider, MD  warfarin (COUMADIN) 1 MG tablet Take 1-2 mg by mouth at bedtime. Alternate 6 mg dose (5 mg + 1 mg) with 7 mg dose (5 mg + 1 mg + 1 mg)   Yes Historical Provider, MD  warfarin (COUMADIN) 5 MG tablet Take 5 mg by mouth See admin instructions. Daily at bedtime:  Alternate 6 mg dose (5 mg + 1 mg) with 7 mg dose (5 mg + 1 mg+ 1 mg)   Yes Historical Provider, MD   Physical Exam: Filed Vitals:   10/18/13 0200  BP: 136/71  Pulse: 87  Temp:   Resp: 26    General:  NAD, resting comfortably in  bed Eyes: PEERLA EOMI ENT: mucous membranes moist Neck: supple w/o JVD Cardiovascular: RRR w/o MRG Respiratory: CTA B Abdomen: soft, nt, nd, bs+ Skin: no rash nor lesion Musculoskeletal: MAE, full ROM all 4 extremities Psychiatric: normal tone and affect Neurologic: AAOx3, grossly non-focal, able to flex and extend legs at hip without pain, BLE weakness when attempting to stand  Labs on Admission:  Basic Metabolic Panel:  Recent Labs Lab 10/17/13 2307  NA 138  K 3.8  CL 104  CO2 25  GLUCOSE 113*  BUN 26*  CREATININE 1.46*  CALCIUM 9.0   Liver Function Tests: No results found for this basename: AST, ALT, ALKPHOS, BILITOT, PROT, ALBUMIN,  in the last 168 hours No results found for this basename: LIPASE, AMYLASE,  in the last 168 hours No results found for this basename: AMMONIA,  in the last 168 hours CBC:  Recent Labs Lab 10/17/13 2307  WBC 11.9*  NEUTROABS 8.3*  HGB 12.2*  HCT 37.1*  MCV 91.8  PLT 180   Cardiac Enzymes:  Recent Labs Lab 10/17/13 2332  CKTOTAL 432*    BNP (last 3 results) No results found for this basename: PROBNP,  in the last 8760 hours CBG: No results found for this basename: GLUCAP,  in the last 168 hours  Radiological Exams on Admission: Dg Ribs Unilateral W/chest Left  10/17/2013   CLINICAL DATA:  Left anterior rib pain.  EXAM: LEFT RIBS AND CHEST - 3+ VIEW  COMPARISON:  PA and lateral chest 09/18/2013.  FINDINGS: The patient is status post CABG and aortic valve replacement. The lungs are clear. Heart size is normal. There is no pneumothorax or pleural fluid. No rib fracture is identified. A marker is placed in the region of concern.  IMPRESSION: No acute abnormality.   Electronically Signed   By: Drusilla Kanner M.D.   On: 10/17/2013 19:35   Dg Thoracic Spine W/swimmers  10/18/2013   CLINICAL DATA:  Status post fall; upper back pain.  Unable to walk.  EXAM: THORACIC SPINE - 2 VIEW + SWIMMERS  COMPARISON:  Chest radiograph performed  09/18/2013  FINDINGS: There is no evidence of fracture or subluxation. Vertebral bodies demonstrate normal height and alignment. Intervertebral disc spaces are preserved along the thoracic spine. There is mild narrowing of the intervertebral disc spaces at the lower cervical spine.  The visualized portions of both lungs are clear. The patient is status post median sternotomy, with evidence of prior CABG. An aortic valve replacement is noted. Clips are noted within the right  upper quadrant, reflecting prior cholecystectomy.  IMPRESSION: No evidence of fracture or subluxation along the thoracic spine.   Electronically Signed   By: Roanna Raider M.D.   On: 10/18/2013 00:03   Dg Lumbar Spine Complete  10/18/2013   CLINICAL DATA:  Status post fall; lower back pain and inability to walk.  EXAM: LUMBAR SPINE - COMPLETE 4+ VIEW  COMPARISON:  Lumbar spine radiographs performed 05/08/2013, and CT of the lumbar spine performed 05/22/2013  FINDINGS: There is no evidence of fracture or subluxation. Vertebral bodies demonstrate normal height and alignment. There is chronic narrowing of the intervertebral disc space at L5-S1, with associated sclerotic change. Narrowing of the intervertebral disc space is also noted at L1-L2.  The visualized bowel gas pattern is unremarkable in appearance; air and stool are noted within the colon. The sacroiliac joints are within normal limits. Clips are noted within the right upper quadrant, reflecting prior cholecystectomy.  IMPRESSION: No evidence of fracture or subluxation along the lumbar spine.   Electronically Signed   By: Roanna Raider M.D.   On: 10/18/2013 00:06   Mr Thoracic Spine W Wo Contrast  10/18/2013   CLINICAL DATA:  Lower extremity weakness after fall.  EXAM: MRI THORACIC SPINE WITHOUT AND WITH CONTRAST  TECHNIQUE: Multiplanar and multiecho pulse sequences of the thoracic spine were obtained without and with intravenous contrast.  CONTRAST:  15mL MULTIHANCE GADOBENATE  DIMEGLUMINE 529 MG/ML IV SOLN  COMPARISON:  Thoracic spine radiograph October 17, 2013.  FINDINGS: Mild STIR signal within the right aspect of the T3 vertebral body associated with the minimal height loss, sagittal STIR 5 /13 and axial T1 7/34. Thoracic vertebral bodies and posterior elements are otherwise intact and aligned and maintenance of thoracic kyphosis. Small acute T3 superior endplate Schmorl's node, no suspicious STIR signal to suggest acute fracture. Intervertebral disk morphology is generally preserved with decreased T2 signal within all disks most consistent with moderate desiccation. Mild ventral endplate spurring of the lower thoracic levels. No abnormal osseous nor intradiscal enhancement.  Thoracic spinal cord appears normal in morphology and signal characteristics of the conus medullaris which terminates at L1. No abnormal cord, leptomeningeal nor epidural enhancement. Moderate symmetric paraspinal muscle atrophy. Included prevertebral soft tissues are nonsuspicious.  Partially imaged C7-T1 anterolisthesis result in mild canal stenosis, suspected congenital canal narrowing of the cervical spine on the localizer. Mild C7-T1 acute discogenic endplate changes.  Annular bulging at T2-3, without canal stenosis or neural foraminal narrowing at any thoracic level.  IMPRESSION: Mild abnormal STIR signal within the right aspect of T3 vertebral body, slight height loss, unclear whether this reflects fracture or Schmorl's node and acute discogenic endplate changes.  Mild degenerative change of the thoracic spine without canal stenosis or neural foraminal narrowing.  No abnormal enhancement within the thoracic spine.  Grade 1 C7-T1 anterolisthesis on apparent degenerative basis, incompletely imaged.   Electronically Signed   By: Awilda Metro   On: 10/18/2013 04:15   Mr Lumbar Spine W Wo Contrast  10/18/2013   CLINICAL DATA:  Lower extremity weakness after fall.  EXAM: MRI LUMBAR SPINE WITHOUT AND  WITH CONTRAST  TECHNIQUE: Multiplanar and multiecho pulse sequences of the lumbar spine were obtained without and with intravenous contrast.  CONTRAST:  15mL MULTIHANCE GADOBENATE DIMEGLUMINE 529 MG/ML IV SOLN  COMPARISON:  Lumbar spine radiographs October 17, 2013 and CT of the lumbar spine. May 22, 2013  FINDINGS: The lumbar vertebral bodies and posterior elements remain intact and aligned with maintenance of the  lumbar lordosis. Severe L1-2 disc height loss, moderate to severe L5-S1 disc height loss is similar, associated with moderate to severe subacute to chronic discogenic endplate changes. Mild ex to moderate acute on chronic enhancing discogenic endplate change at L2-3. Mild chronic discogenic endplate change at L4-5. Decreased T2 signal within all lumbar disks most consistent with moderate desiccation. Scattered subcentimeter bright T1 and bright T2 foci lose signal on this fat sat sequence suggesting focal fat, no suspicious osseous nor intradiscal enhancement.  Conus medullaris terminates at L1 and appears normal in morphology and signal characteristics. Cauda equina is unremarkable. No abnormal cord, leptomeningeal nor epidural enhancement.  Bright T1 bone marrow signal in the sacrum most consistent with osteopenia. Moderate symmetric paraspinal muscle atrophy. Ectasia of the infrarenal aorta, measuring 2.8 cm.  Level by level evaluation:  L1-L2: 2 mm broad-based disc osteophyte complex, mild facet arthropathy and ligamentum flavum redundancy without canal stenosis or neural foraminal narrowing.  L2-L3: 4 mm broad-based disk bulge, with mild enhancing annular tear. Mild facet arthropathy and ligamentum flavum redundancy. Minimal canal stenosis with partial effacement of the lateral recesses which likely affect the traversing L3 nurse. No neural foraminal narrowing.  L3-L4: 3 mm broad-based disk bulge with enhancing annular tear. Mild facet arthropathy and ligamentum flavum redundancy. Minimal canal  stenosis, with partial effacement of lateral recesses which may affect the traversing L4 nerve. No neural foraminal narrowing.  L4-L5: 3 mm broad-based disk bulge asymmetric to the right with enhancing annular tear. Mild facet arthropathy and ligamentum flavum redundancy. No canal stenosis. Mild to moderate right neural foraminal narrowing.  L5-S1: No disk bulge. Mild facet arthropathy without canal stenosis. Mild bilateral neural foraminal narrowing.  IMPRESSION: Mild abnormal STIR signal within the right aspect of T3 vertebral body, slight height loss, is unclear whether this reflects fracture or Schmorl's node and acute discogenic endplate changes.  No acute fracture or malalignment. No abnormal enhancement of the lumbar spine.  Degenerative change of lumbar spine with minimal canal stenosis at L2-3 and L3-4. Neural foraminal narrowing L4-5 and L5-S1: Mild to moderate on the right at L4-5.  Multi-level enhancing annular tears.   Electronically Signed   By: Awilda Metro   On: 10/18/2013 04:23    EKG: Independently reviewed.  Assessment/Plan Principal Problem:   Bilateral leg weakness Active Problems:   Fall at home   1. BLE weakness - unclear cause, work up in ED unimpressive, CPK only 400, no evidence of neuro impingement on MRI of T and L spine.  Bilateral presentation inconsistent with stroke, and patient is certain that he did not strike his head and had no LOC.  Suspicious that prolonged immobilization in cold wether after the fall may be to blame for his symptoms.  Will admit, have PT and OT evaluate the patient further in the morning.    Code Status: Full Code (must indicate code status--if unknown or must be presumed, indicate so) Family Communication: Wife at bedside (indicate person spoken with, if applicable, with phone number if by telephone) Disposition Plan: Admit to inpatient (indicate anticipated LOS)  Time spent: 50 min  GARDNER, JARED M. [facility] Pager  [phone]  If 7PM-7AM, please contact night-coverage [email or web address] Password [email or web address] 10/18/2013, 5:25 AM

## 2013-10-18 NOTE — Progress Notes (Signed)
Patient admitted after midnight.  Chart reviewed.  Pt interviewed and examined.  Feels stronger.  He and his wife feel weakness likely related to prolonged cold exposure. Will check B12, folate, TSH.  Awaiting word on SNF.  Crista Curb, M.D. [phone]

## 2013-10-18 NOTE — Progress Notes (Signed)
Clinical Social Work Department CLINICAL SOCIAL WORK PLACEMENT NOTE 10/18/2013  Patient:  Clayton [name], Clayton [name]  Account Number:  [id number] Admit date:  10/17/2013  Clinical Social Worker:  Leron Croak, CLINICAL SOCIAL WORKER  Date/time:  10/18/2013 03:26 PM  Clinical Social Work is seeking post-discharge placement for this patient at the following level of care:   SKILLED NURSING   (*CSW will update this form in Epic as items are completed)   10/18/2013  Patient/family provided with [facility] Department of Clinical Social Work's list of facilities offering this level of care within the geographic area requested by the patient (or if unable, by the patient's family).  10/18/2013  Patient/family informed of their freedom to choose among providers that offer the needed level of care, that participate in Medicare, Medicaid or managed care program needed by the patient, have an available bed and are willing to accept the patient.  10/18/2013  Patient/family informed of [facility]' ownership interest in [facility], as well as of the fact that they are under no obligation to receive care at this facility.  PASARR submitted to EDS on 10/18/2013 PASARR number received from EDS on 10/18/2013  FL2 transmitted to all facilities in geographic area requested by pt/family on  10/18/2013 FL2 transmitted to all facilities within larger geographic area on 10/18/2013  Patient informed that his/her managed care company has contracts with or will negotiate with  certain facilities, including the following:     Patient/family informed of bed offers received:   Patient chooses bed at  Physician recommends and patient chooses bed at    Patient to be transferred to  on   Patient to be transferred to facility by   The following physician request were entered in Epic:   Additional Comments:    Lenord Carbo  [facility]  4N 1-16;  6N1-16 Phone: [phone]

## 2013-10-18 NOTE — Progress Notes (Signed)
CSW left a message for [facility] representative Carollee Herter [phone]) asking about SNF placement.   CSW awaiting call back for d/c planning.   CSW awaiting PT/OT eval.   CSW will continue to follow Pt for d/c planning.    Clayton [name] [facility]  4N 1-16;  [phone] Phone: [phone]

## 2013-10-19 DIAGNOSIS — E538 Deficiency of other specified B group vitamins: Secondary | ICD-10-CM | POA: Diagnosis present

## 2013-10-19 DIAGNOSIS — Z7901 Long term (current) use of anticoagulants: Secondary | ICD-10-CM

## 2013-10-19 DIAGNOSIS — I10 Essential (primary) hypertension: Secondary | ICD-10-CM

## 2013-10-19 DIAGNOSIS — H547 Unspecified visual loss: Secondary | ICD-10-CM | POA: Diagnosis present

## 2013-10-19 DIAGNOSIS — R5381 Other malaise: Secondary | ICD-10-CM

## 2013-10-19 LAB — PROTIME-INR
INR: 2.54 — ABNORMAL HIGH (ref 0.00–1.49)
Prothrombin Time: 26.5 seconds — ABNORMAL HIGH (ref 11.6–15.2)

## 2013-10-19 LAB — TSH: TSH: 1.15 u[IU]/mL (ref 0.350–4.500)

## 2013-10-19 MED ORDER — WARFARIN SODIUM 6 MG PO TABS
6.0000 mg | ORAL_TABLET | Freq: Once | ORAL | Status: AC
  Administered 2013-10-19: 6 mg via ORAL
  Filled 2013-10-19: qty 1

## 2013-10-19 MED ORDER — LORATADINE 10 MG PO TABS
10.0000 mg | ORAL_TABLET | Freq: Every day | ORAL | Status: DC
  Administered 2013-10-19 – 2013-10-20 (×2): 10 mg via ORAL
  Filled 2013-10-19 (×2): qty 1

## 2013-10-19 MED ORDER — CAMPHOR-MENTHOL 0.5-0.5 % EX LOTN
TOPICAL_LOTION | Freq: Two times a day (BID) | CUTANEOUS | Status: DC
  Administered 2013-10-19 – 2013-10-20 (×3): via TOPICAL
  Filled 2013-10-19: qty 222

## 2013-10-19 MED ORDER — CYANOCOBALAMIN 1000 MCG/ML IJ SOLN
1000.0000 ug | Freq: Every day | INTRAMUSCULAR | Status: DC
  Administered 2013-10-19 – 2013-10-20 (×2): 1000 ug via INTRAMUSCULAR
  Filled 2013-10-19 (×3): qty 1

## 2013-10-19 NOTE — Progress Notes (Signed)
CSW still awaiting Insurance auth and have left a message with Carollee Herter of [employer]. Clinicals have been sent and awaiting a call back.   CSW will continue to follow Pt for d/c planning.    Clayton [name] [facility]  4N 1-16;  [phone] Phone: [phone]

## 2013-10-19 NOTE — Progress Notes (Signed)
Occupational Therapy Treatment Patient Details Name: Clayton [name] MRN: [id number] DOB: 1926-07-19 Today's Date: 10/19/2013 Time: 2130-8657 OT Time Calculation (min): 28 min  OT Assessment / Plan / Recommendation  History of present illness pt fell out walking in the field near his home and has since had Bil LE weakness.     OT comments  Pt with improvement in endurance, ADL transfers, and standing activities.  Balance deficits remain with low vision being a factor.  Follow Up Recommendations  SNF;Supervision/Assistance - 24 hour    Barriers to Discharge       Equipment Recommendations       Recommendations for Other Services    Frequency Min 2X/week   Progress towards OT Goals Progress towards OT goals: Progressing toward goals  Plan Discharge plan remains appropriate    Precautions / Restrictions Precautions Precautions: Fall Precaution Comments: pt with macular degeneration Restrictions Weight Bearing Restrictions: No   Pertinent Vitals/Pain No pain, VSS.    ADL  Eating/Feeding: Set up Where Assessed - Eating/Feeding: Edge of bed Grooming: Wash/dry hands;Min guard Where Assessed - Grooming: Unsupported standing Upper Body Dressing: Set up Where Assessed - Upper Body Dressing: Unsupported sitting Lower Body Dressing: Minimal assistance Where Assessed - Lower Body Dressing: Unsupported sitting;Supported sit to stand Toilet Transfer: Min Pension scheme manager Method: Sit to Barista: Comfort height toilet Toileting - Clothing Manipulation and Hygiene: Minimal assistance Where Assessed - Engineer, mining and Hygiene: Sit to stand from 3-in-1 or toilet Equipment Used: Cane;Gait belt ADL Comments: Pt primarily limited by low vision.    OT Diagnosis:    OT Problem List:   OT Treatment Interventions:     OT Goals(current goals can now be found in the care plan section) Acute Rehab OT Goals Patient Stated Goal: to return  home  Visit Information  Last OT Received On: 10/19/13 Assistance Needed: +1 History of Present Illness: pt fell out walking in the field near his home and has since had Bil LE weakness.      Subjective Data      Prior Functioning       Cognition  Cognition Arousal/Alertness: Awake/alert Behavior During Therapy: WFL for tasks assessed/performed Overall Cognitive Status: Within Functional Limits for tasks assessed    Mobility  Bed Mobility Bed Mobility: Sit to Supine;Scooting to HOB Supine to Sit: 6: Modified independent (Device/Increase time);With rails;HOB elevated Sitting - Scoot to Edge of Bed: 6: Modified independent (Device/Increase time) Transfers Transfers: Sit to Stand;Stand to Sit Sit to Stand: 4: Min guard Stand to Sit: 4: Min guard Details for Transfer Assistance: cues for safety    Exercises      Balance Balance Balance Assessed: Yes Static Standing Balance Static Standing - Balance Support: No upper extremity supported Static Standing - Level of Assistance: 5: Stand by assistance   End of Session OT - End of Session Equipment Utilized During Treatment: Gait belt (cane) Activity Tolerance: Patient tolerated treatment well Patient left: in bed;with call bell/phone within reach;with family/visitor present (edge of bed)  GO     Evern Bio 10/19/2013, 2:09 PM [phone]

## 2013-10-19 NOTE — Progress Notes (Signed)
Physical Therapy Treatment Patient Details Name: Clayton [name] MRN: [id number] DOB: [birth year] Today's Date: 10/19/2013 Time: 2956-2130 PT Time Calculation (min): 15 min  PT Assessment / Plan / Recommendation  History of Present Illness pt fell out walking in the field near his home and has since had Bil LE weakness.     PT Comments   Patient able to progress with ambulation distance however, still requiring Min A for balance and stability. Patients wife is not with patient 24/7 therefore continue to recommend SNF at discharge.   Follow Up Recommendations  SNF     Does the patient have the potential to tolerate intense rehabilitation     Barriers to Discharge        Equipment Recommendations  Rolling walker with 5" wheels (L platform)    Recommendations for Other Services    Frequency Min 3X/week   Progress towards PT Goals Progress towards PT goals: Progressing toward goals  Plan Current plan remains appropriate    Precautions / Restrictions Precautions Precautions: Fall Precaution Comments: pt with macular degeneration Restrictions Weight Bearing Restrictions: No   Pertinent Vitals/Pain no apparent distress    Mobility  Bed Mobility Bed Mobility: Sit to Supine;Scooting to HOB Supine to Sit: 6: Modified independent (Device/Increase time);With rails;HOB elevated Sitting - Scoot to Edge of Bed: 6: Modified independent (Device/Increase time) Sit to Supine: 4: Min assist Details for Bed Mobility Assistance: Paitent required assistance for LEs back into bed today Transfers Sit to Stand: 4: Min guard;From bed;From chair/3-in-1 Stand to Sit: 4: Min guard;To bed;To chair/3-in-1 Details for Transfer Assistance: cues for safety Ambulation/Gait Ambulation/Gait Assistance: 4: Min assist Ambulation Distance (Feet): 100 Feet Assistive device: Straight cane Ambulation/Gait Assistance Details: A for balance as patient unsteady with ambulation with R side lean. Cues for control and  safety.  Gait Pattern: Step-through pattern;Decreased stride length;Trunk flexed    Exercises     PT Diagnosis:    PT Problem List:   PT Treatment Interventions:     PT Goals (current goals can now be found in the care plan section) Acute Rehab PT Goals Patient Stated Goal: to return home  Visit Information  Last PT Received On: 10/19/13 Assistance Needed: +1 History of Present Illness: pt fell out walking in the field near his home and has since had Bil LE weakness.      Subjective Data  Patient Stated Goal: to return home   Cognition  Cognition Arousal/Alertness: Awake/alert Behavior During Therapy: WFL for tasks assessed/performed Overall Cognitive Status: Within Functional Limits for tasks assessed    Balance  Balance Balance Assessed: Yes Static Standing Balance Static Standing - Balance Support: No upper extremity supported Static Standing - Level of Assistance: 5: Stand by assistance  End of Session PT - End of Session Equipment Utilized During Treatment: Gait belt Activity Tolerance: Patient tolerated treatment well;Patient limited by fatigue Patient left: in bed;with bed alarm set Nurse Communication: Mobility status   GP     Fredrich Birks 10/19/2013, 2:32 PM  10/19/2013 Fredrich Birks PTA [phone] pager [phone] office

## 2013-10-19 NOTE — Progress Notes (Signed)
CSW left a message for Jasmine December (Admission coordinator at [facility]) asking if they could take a look at the referrals as the Pt and wife are interested in that facility.   CSW awaiting call back.   CSW will continue to follow Pt for d/c planning.    Leron Croak [facility]  4N 1-16;  [phone] Phone: [phone]

## 2013-10-19 NOTE — Progress Notes (Signed)
[facility] PROGRESS NOTE  [name]:[id number] DOB: 05-16-1926 DOA: 10/17/2013 PCP: Feliciana Rossetti, MD  Assessment/Plan:    Weakness, generalized: PT recommending SNF.  Await authorization.  Not limited to legs on my exam or patient report, despite H&P Active Problems:   Fall   B12 deficiency: contributing to above: start injections   HYPERTENSION, UNSPECIFIED   CAD, ARTERY BYPASS GRAFT   AORTIC VALVE REPLACEMENT, HX OF   Impaired vision   Chronic anticoagulation  Code Status:  full Family Communication:  Family at bedside Disposition Plan:  SNF if approved  Consultants:    Procedures:     Antibiotics:    HPI/Subjective: Feels a little stronger.  No bowel or bladder dysfunction.  No parasthesias or numbness  Objective: Filed Vitals:   10/19/13 1359  BP: 94/74  Pulse: 63  Temp: 97.5 F (36.4 C)  Resp: 18    Intake/Output Summary (Last 24 hours) at 10/19/13 1525 Last data filed at 10/19/13 1300  Gross per 24 hour  Intake    840 ml  Output      0 ml  Net    840 ml   Filed Weights   10/17/13 1858 10/18/13 0638  Weight: 87.544 kg (193 lb) 86.9 kg (191 lb 9.3 oz)    Exam:   General:  Alert. Sitting at side of bed. oriented. Eating lunch  Cardiovascular: RRR without MGR  Respiratory: CTA without WRR  Abdomen: S, NT, ND  Ext: no CCE  Basic Metabolic Panel:  Recent Labs Lab 10/17/13 2307  NA 138  K 3.8  CL 104  CO2 25  GLUCOSE 113*  BUN 26*  CREATININE 1.46*  CALCIUM 9.0   Liver Function Tests: No results found for this basename: AST, ALT, ALKPHOS, BILITOT, PROT, ALBUMIN,  in the last 168 hours No results found for this basename: LIPASE, AMYLASE,  in the last 168 hours No results found for this basename: AMMONIA,  in the last 168 hours CBC:  Recent Labs Lab 10/17/13 2307  WBC 11.9*  NEUTROABS 8.3*  HGB 12.2*  HCT 37.1*  MCV 91.8  PLT 180   Cardiac Enzymes:  Recent Labs Lab 10/17/13 2332  CKTOTAL 432*   BNP  (last 3 results) No results found for this basename: PROBNP,  in the last 8760 hours CBG: No results found for this basename: GLUCAP,  in the last 168 hours  No results found for this or any previous visit (from the past 240 hour(s)).   Studies: Dg Ribs Unilateral W/chest Left  10/17/2013   CLINICAL DATA:  Left anterior rib pain.  EXAM: LEFT RIBS AND CHEST - 3+ VIEW  COMPARISON:  PA and lateral chest 09/18/2013.  FINDINGS: The patient is status post CABG and aortic valve replacement. The lungs are clear. Heart size is normal. There is no pneumothorax or pleural fluid. No rib fracture is identified. A marker is placed in the region of concern.  IMPRESSION: No acute abnormality.   Electronically Signed   By: Drusilla Kanner M.D.   On: 10/17/2013 19:35   Dg Thoracic Spine W/swimmers  10/18/2013   CLINICAL DATA:  Status post fall; upper back pain.  Unable to walk.  EXAM: THORACIC SPINE - 2 VIEW + SWIMMERS  COMPARISON:  Chest radiograph performed 09/18/2013  FINDINGS: There is no evidence of fracture or subluxation. Vertebral bodies demonstrate normal height and alignment. Intervertebral disc spaces are preserved along the thoracic spine. There is mild narrowing of the intervertebral disc spaces at the lower  cervical spine.  The visualized portions of both lungs are clear. The patient is status post median sternotomy, with evidence of prior CABG. An aortic valve replacement is noted. Clips are noted within the right upper quadrant, reflecting prior cholecystectomy.  IMPRESSION: No evidence of fracture or subluxation along the thoracic spine.   Electronically Signed   By: Roanna Raider M.D.   On: 10/18/2013 00:03   Dg Lumbar Spine Complete  10/18/2013   CLINICAL DATA:  Status post fall; lower back pain and inability to walk.  EXAM: LUMBAR SPINE - COMPLETE 4+ VIEW  COMPARISON:  Lumbar spine radiographs performed 05/08/2013, and CT of the lumbar spine performed 05/22/2013  FINDINGS: There is no evidence  of fracture or subluxation. Vertebral bodies demonstrate normal height and alignment. There is chronic narrowing of the intervertebral disc space at L5-S1, with associated sclerotic change. Narrowing of the intervertebral disc space is also noted at L1-L2.  The visualized bowel gas pattern is unremarkable in appearance; air and stool are noted within the colon. The sacroiliac joints are within normal limits. Clips are noted within the right upper quadrant, reflecting prior cholecystectomy.  IMPRESSION: No evidence of fracture or subluxation along the lumbar spine.   Electronically Signed   By: Roanna Raider M.D.   On: 10/18/2013 00:06   Mr Thoracic Spine W Wo Contrast  10/18/2013   CLINICAL DATA:  Lower extremity weakness after fall.  EXAM: MRI THORACIC SPINE WITHOUT AND WITH CONTRAST  TECHNIQUE: Multiplanar and multiecho pulse sequences of the thoracic spine were obtained without and with intravenous contrast.  CONTRAST:  15mL MULTIHANCE GADOBENATE DIMEGLUMINE 529 MG/ML IV SOLN  COMPARISON:  Thoracic spine radiograph October 17, 2013.  FINDINGS: Mild STIR signal within the right aspect of the T3 vertebral body associated with the minimal height loss, sagittal STIR 5 /13 and axial T1 7/34. Thoracic vertebral bodies and posterior elements are otherwise intact and aligned and maintenance of thoracic kyphosis. Small acute T3 superior endplate Schmorl's node, no suspicious STIR signal to suggest acute fracture. Intervertebral disk morphology is generally preserved with decreased T2 signal within all disks most consistent with moderate desiccation. Mild ventral endplate spurring of the lower thoracic levels. No abnormal osseous nor intradiscal enhancement.  Thoracic spinal cord appears normal in morphology and signal characteristics of the conus medullaris which terminates at L1. No abnormal cord, leptomeningeal nor epidural enhancement. Moderate symmetric paraspinal muscle atrophy. Included prevertebral soft  tissues are nonsuspicious.  Partially imaged C7-T1 anterolisthesis result in mild canal stenosis, suspected congenital canal narrowing of the cervical spine on the localizer. Mild C7-T1 acute discogenic endplate changes.  Annular bulging at T2-3, without canal stenosis or neural foraminal narrowing at any thoracic level.  IMPRESSION: Mild abnormal STIR signal within the right aspect of T3 vertebral body, slight height loss, unclear whether this reflects fracture or Schmorl's node and acute discogenic endplate changes.  Mild degenerative change of the thoracic spine without canal stenosis or neural foraminal narrowing.  No abnormal enhancement within the thoracic spine.  Grade 1 C7-T1 anterolisthesis on apparent degenerative basis, incompletely imaged.   Electronically Signed   By: Awilda Metro   On: 10/18/2013 04:15   Mr Lumbar Spine W Wo Contrast  10/18/2013   CLINICAL DATA:  Lower extremity weakness after fall.  EXAM: MRI LUMBAR SPINE WITHOUT AND WITH CONTRAST  TECHNIQUE: Multiplanar and multiecho pulse sequences of the lumbar spine were obtained without and with intravenous contrast.  CONTRAST:  15mL MULTIHANCE GADOBENATE DIMEGLUMINE 529 MG/ML IV SOLN  COMPARISON:  Lumbar spine radiographs October 17, 2013 and CT of the lumbar spine. May 22, 2013  FINDINGS: The lumbar vertebral bodies and posterior elements remain intact and aligned with maintenance of the lumbar lordosis. Severe L1-2 disc height loss, moderate to severe L5-S1 disc height loss is similar, associated with moderate to severe subacute to chronic discogenic endplate changes. Mild ex to moderate acute on chronic enhancing discogenic endplate change at L2-3. Mild chronic discogenic endplate change at L4-5. Decreased T2 signal within all lumbar disks most consistent with moderate desiccation. Scattered subcentimeter bright T1 and bright T2 foci lose signal on this fat sat sequence suggesting focal fat, no suspicious osseous nor intradiscal  enhancement.  Conus medullaris terminates at L1 and appears normal in morphology and signal characteristics. Cauda equina is unremarkable. No abnormal cord, leptomeningeal nor epidural enhancement.  Bright T1 bone marrow signal in the sacrum most consistent with osteopenia. Moderate symmetric paraspinal muscle atrophy. Ectasia of the infrarenal aorta, measuring 2.8 cm.  Level by level evaluation:  L1-L2: 2 mm broad-based disc osteophyte complex, mild facet arthropathy and ligamentum flavum redundancy without canal stenosis or neural foraminal narrowing.  L2-L3: 4 mm broad-based disk bulge, with mild enhancing annular tear. Mild facet arthropathy and ligamentum flavum redundancy. Minimal canal stenosis with partial effacement of the lateral recesses which likely affect the traversing L3 nurse. No neural foraminal narrowing.  L3-L4: 3 mm broad-based disk bulge with enhancing annular tear. Mild facet arthropathy and ligamentum flavum redundancy. Minimal canal stenosis, with partial effacement of lateral recesses which may affect the traversing L4 nerve. No neural foraminal narrowing.  L4-L5: 3 mm broad-based disk bulge asymmetric to the right with enhancing annular tear. Mild facet arthropathy and ligamentum flavum redundancy. No canal stenosis. Mild to moderate right neural foraminal narrowing.  L5-S1: No disk bulge. Mild facet arthropathy without canal stenosis. Mild bilateral neural foraminal narrowing.  IMPRESSION: Mild abnormal STIR signal within the right aspect of T3 vertebral body, slight height loss, is unclear whether this reflects fracture or Schmorl's node and acute discogenic endplate changes.  No acute fracture or malalignment. No abnormal enhancement of the lumbar spine.  Degenerative change of lumbar spine with minimal canal stenosis at L2-3 and L3-4. Neural foraminal narrowing L4-5 and L5-S1: Mild to moderate on the right at L4-5.  Multi-level enhancing annular tears.   Electronically Signed   By:  Awilda Metro   On: 10/18/2013 04:23    Scheduled Meds: . aspirin EC  81 mg Oral Daily  . camphor-menthol   Topical BID  . cyanocobalamin  1,000 mcg Intramuscular Daily  . dorzolamide-timolol  1 drop Both Eyes Daily  . latanoprost  1 drop Both Eyes QHS  . lisinopril  10 mg Oral Daily  . loratadine  10 mg Oral Daily  . metoprolol  100 mg Oral Daily  . metoprolol  50 mg Oral QHS  . multivitamin  1 tablet Oral BID  . pantoprazole  40 mg Oral Daily  . tamsulosin  0.4 mg Oral QHS  . warfarin  6 mg Oral ONCE-1800  . Warfarin - Pharmacist Dosing Inpatient   Does not apply q1800   Continuous Infusions:   Time spent: 25 minutes  Doyce Stonehouse L  [facility] Pager [phone]. If 7PM-7AM, please contact night-coverage at [email or web address], password [facility] 10/19/2013, 3:25 PM  LOS: 2 days

## 2013-10-19 NOTE — Progress Notes (Signed)
ANTICOAGULATION CONSULT NOTE - Follow Up Consult  Pharmacy Consult for warfarin Indication: St. Jude's aortic valve  Allergies  Allergen Reactions  . Tetanus Toxoids Hives    Patient Measurements: Height: 5\' 8"  (172.7 cm) Weight: 191 lb 9.3 oz (86.9 kg) IBW/kg (Calculated) : 68.4   Vital Signs: Temp: 97.5 F (36.4 C) (12/11 0549) Temp src: Oral (12/11 0549) BP: 158/68 mmHg (12/11 0549) Pulse Rate: 67 (12/11 0549)  Labs:  Recent Labs  10/17/13 2307 10/17/13 2332 10/19/13 0340  HGB 12.2*  --   --   HCT 37.1*  --   --   PLT 180  --   --   LABPROT 29.1*  --  26.5*  INR 2.87*  --  2.54*  CREATININE 1.46*  --   --   CKTOTAL  --  432*  --     Estimated Creatinine Clearance: 39.7 ml/min (by C-G formula based on Cr of 1.46).   Medications:  Scheduled:  . aspirin EC  81 mg Oral Daily  . cyanocobalamin  1,000 mcg Intramuscular Daily  . dorzolamide-timolol  1 drop Both Eyes Daily  . latanoprost  1 drop Both Eyes QHS  . lisinopril  10 mg Oral Daily  . metoprolol  100 mg Oral Daily  . metoprolol  50 mg Oral QHS  . multivitamin  1 tablet Oral BID  . pantoprazole  40 mg Oral Daily  . tamsulosin  0.4 mg Oral QHS  . Warfarin - Pharmacist Dosing Inpatient   Does not apply q1800    Assessment: 87 YOM brought in after falling and lying in field for 4 hours. To continue warfarin. Home dose is 6mg  alternating with 7mg  every other day. INR on admit 2.87, today INR is 2.54 (note: patient did NOT take a dose of warfairn 12/9 prior to coming to the hospital). No bleeding noted. CBC stable.  Goal of Therapy:  INR 2-3- typical range for St. Jude's valve. Did not see an alternate goal range in cardiology office notes or elsewhere in chart Monitor platelets by anticoagulation protocol: Yes   Plan:  1. Warfarin 6mg  po x1 tonight as per home dosing 2. Daily INR 3. CBC tomorrow 4. Follow for s/s bleeding, dispo planning  Sriyan Cutting D. Dominik Lauricella, PharmD, BCPS Clinical Pharmacist Pager:  [phone] 10/19/2013 12:39 PM

## 2013-10-20 LAB — CBC
MCHC: 32.7 g/dL (ref 30.0–36.0)
Platelets: 136 10*3/uL — ABNORMAL LOW (ref 150–400)
RDW: 14 % (ref 11.5–15.5)
WBC: 7.2 10*3/uL (ref 4.0–10.5)

## 2013-10-20 LAB — PROTIME-INR
INR: 2.58 — ABNORMAL HIGH (ref 0.00–1.49)
Prothrombin Time: 26.8 seconds — ABNORMAL HIGH (ref 11.6–15.2)

## 2013-10-20 MED ORDER — WARFARIN SODIUM 6 MG PO TABS
7.0000 mg | ORAL_TABLET | Freq: Once | ORAL | Status: DC
  Filled 2013-10-20: qty 1

## 2013-10-20 MED ORDER — WARFARIN SODIUM 1 MG PO TABS: ORAL_TABLET | ORAL | Status: DC

## 2013-10-20 MED ORDER — CAMPHOR-MENTHOL 0.5-0.5 % EX LOTN: TOPICAL_LOTION | Freq: Two times a day (BID) | CUTANEOUS | Status: DC | PRN

## 2013-10-20 MED ORDER — CYANOCOBALAMIN 1000 MCG/ML IJ SOLN: INTRAMUSCULAR | Status: DC

## 2013-10-20 MED ORDER — LORATADINE 10 MG PO TABS: 10.0000 mg | ORAL_TABLET | Freq: Every day | ORAL | Status: DC | PRN

## 2013-10-20 NOTE — Progress Notes (Signed)
Physical Therapy Treatment Patient Details Name: VARICK KEYS MRN: [id number] DOB: 20-Feb-1926 Today's Date: 10/20/2013 Time: 1350-1406 PT Time Calculation (min): 16 min  PT Assessment / Plan / Recommendation  History of Present Illness pt fell out walking in the field near his home and has since had Bil LE weakness.     PT Comments   Patient agreeable to ambulation and progressing well. Awaiting SNF as wife in not availible 24/7  Follow Up Recommendations  SNF     Does the patient have the potential to tolerate intense rehabilitation     Barriers to Discharge        Equipment Recommendations       Recommendations for Other Services    Frequency Min 3X/week   Progress towards PT Goals Progress towards PT goals: Progressing toward goals  Plan Current plan remains appropriate    Precautions / Restrictions Precautions Precautions: Fall Precaution Comments: pt with macular degeneration   Pertinent Vitals/Pain no apparent distress    Mobility  Bed Mobility Bed Mobility: Not assessed Transfers Sit to Stand: 4: Min guard;From bed;From chair/3-in-1 Stand to Sit: 4: Min guard;To bed;To chair/3-in-1 Details for Transfer Assistance: cues for safety Ambulation/Gait Ambulation/Gait Assistance: 4: Min assist Ambulation Distance (Feet): 250 Feet Assistive device: Straight cane Gait Pattern: Step-through pattern;Decreased stride length;Trunk flexed Gait velocity: decreased    Exercises     PT Diagnosis:    PT Problem List:   PT Treatment Interventions:     PT Goals (current goals can now be found in the care plan section)    Visit Information  Last PT Received On: 10/20/13 Assistance Needed: +1 History of Present Illness: pt fell out walking in the field near his home and has since had Bil LE weakness.      Subjective Data      Cognition  Cognition Arousal/Alertness: Awake/alert Behavior During Therapy: WFL for tasks assessed/performed Overall Cognitive Status:  Within Functional Limits for tasks assessed    Balance     End of Session PT - End of Session Equipment Utilized During Treatment: Gait belt Activity Tolerance: Patient tolerated treatment well Patient left: with call bell/phone within reach;in chair;with family/visitor present Nurse Communication: Mobility status   GP     Fredrich Birks 10/20/2013, 2:30 PM 10/20/2013 Fredrich Birks PTA [phone] pager [phone] office

## 2013-10-20 NOTE — Progress Notes (Signed)
CSW provided list of bed offers for SNF facilities.   Pt and family choose [name] in [place].   CSW will notify facility and MD for d/c planning and d/c summary.    Leron Croak, LCSWA [facility].  [phone]

## 2013-10-20 NOTE — Progress Notes (Signed)
ANTICOAGULATION CONSULT NOTE - Follow Up Consult  Pharmacy Consult for warfarin Indication: St. Jude's aortic valve  Allergies  Allergen Reactions  . Tetanus Toxoids Hives    Patient Measurements: Height: 5\' 8"  (172.7 cm) Weight: 191 lb 9.3 oz (86.9 kg) IBW/kg (Calculated) : 68.4   Vital Signs: Temp: 97.8 F (36.6 C) (12/12 0520) Temp src: Oral (12/12 0520) BP: 182/68 mmHg (12/12 0520) Pulse Rate: 82 (12/12 0520)  Labs:  Recent Labs  10/17/13 2307 10/17/13 2332 10/19/13 0340 10/20/13 0605  HGB 12.2*  --   --  10.2*  HCT 37.1*  --   --  31.2*  PLT 180  --   --  136*  LABPROT 29.1*  --  26.5* 26.8*  INR 2.87*  --  2.54* 2.58*  CREATININE 1.46*  --   --   --   CKTOTAL  --  432*  --   --     Estimated Creatinine Clearance: 39.7 ml/min (by C-G formula based on Cr of 1.46).   Medications:  Scheduled:  . aspirin EC  81 mg Oral Daily  . camphor-menthol   Topical BID  . cyanocobalamin  1,000 mcg Intramuscular Daily  . dorzolamide-timolol  1 drop Both Eyes Daily  . latanoprost  1 drop Both Eyes QHS  . lisinopril  10 mg Oral Daily  . loratadine  10 mg Oral Daily  . metoprolol  100 mg Oral Daily  . metoprolol  50 mg Oral QHS  . multivitamin  1 tablet Oral BID  . pantoprazole  40 mg Oral Daily  . tamsulosin  0.4 mg Oral QHS  . Warfarin - Pharmacist Dosing Inpatient   Does not apply q1800    Assessment: 87 YOM brought in after falling and lying in field for 4 hours. To continue warfarin. Home dose is 6mg  alternating with 7mg  every other day. INR on admit 2.87, today INR is 2.58 (note: patient did NOT take a dose of warfairn 12/9 prior to coming to the hospital). No bleeding noted. CBC stable.  Goal of Therapy:  INR 2-3- typical range for St. Jude's valve. Did not see an alternate goal range in cardiology office notes or elsewhere in chart Monitor platelets by anticoagulation protocol: Yes   Plan:  1. Warfarin 7mg  po x1 tonight as per home dosing 2. Daily INR 3.  CBC tomorrow 4. Follow for s/s bleeding, dispo planning  Link Snuffer, PharmD, BCPS Clinical Pharmacist [phone] 10/20/2013 11:12 AM

## 2013-10-20 NOTE — Discharge Summary (Signed)
Physician Discharge Summary  Clayton [name] NFA:[id number] DOB: 27-Jan-1926 DOA: 10/17/2013  PCP: Feliciana Rossetti, MD  Admit date: 10/17/2013 Discharge date: 10/20/2013  Time spent: greater than 30 minutes  Recommendations for Outpatient Follow-up:  1. Monitor INR at least weekly; adjust warfarin to keep INR between 2.5 and 3.5  Discharge Diagnoses:  Principal Problem:   Weakness Active Problems:   Fall   B12 deficiency   HYPERTENSION, UNSPECIFIED   CAD, ARTERY BYPASS GRAFT   AORTIC VALVE REPLACEMENT, HX OF   Impaired vision   Chronic anticoagulation  previous left hand and wrist injury as a child  Discharge Condition: stable  Filed Weights   10/17/13 1858 10/18/13 4696  Weight: 87.544 kg (193 lb) 86.9 kg (191 lb 9.3 oz)    History of present illness:  79 y.o. male who presents to the ED after a fall. Patient was out walking and he stumbled and fell onto his back. Per family patient lying in the field for up to 4 hours prior to being found (patient is a retired Visual merchandiser). Patient reports he tried to stand up and walk home after the fall but was unable to due to weakness. He has been unable to walk or stand without support on both sides since falling. No LOC, didn't hit his head, does have some back pain.  Work up in the ED including emergent MRI of T spine and L spine was negative.   Hospital Course:  Patient was admitted to hospitalist service. Worked with physical therapy and occupational therapy. No focal weakness was noted. As part of the workup, a B12 level was done and it was low at 211. This is likely contributing to the weakness. He was started on B12 injections. He had no evidence of stroke or spinal cord lesion on exam. His strength has improved a bit while here, but he would benefit from short-term skilled nursing facility. His other medical problems remained stable while here.  Procedures:  None  Consultations:  Physical therapy, occupational therapy, social  work.  Discharge Exam: Filed Vitals:   10/20/13 0520  BP: 182/68  Pulse: 82  Temp: 97.8 F (36.6 C)  Resp: 20    General: In chair. Eating lunch. Cardiovascular: Regular rate rhythm without murmurs gallops rubs Respiratory: Clear to auscultation bilaterally without wheezes rhonchi or rales Extremities: No clubbing cyanosis or edema.  Discharge Instructions  Discharge Orders   Future Orders Complete By Expires   Diet - low sodium heart healthy  As directed    Walk with assistance  As directed        Medication List         aspirin EC 81 MG tablet  Take 81 mg by mouth daily.     camphor-menthol lotion  Commonly known as:  SARNA  Apply topically 2 (two) times daily as needed for itching. To itchy area on right arm     cyanocobalamin 1000 MCG/ML injection  Commonly known as:  (VITAMIN B-12)  1000 mcg IM daily for 5 days, then weekly for 3 weeks, then monthly     dorzolamide-timolol 22.3-6.8 MG/ML ophthalmic solution  Commonly known as:  COSOPT  Place 1 drop into both eyes daily.     latanoprost 0.005 % ophthalmic solution  Commonly known as:  XALATAN  Place 1 drop into both eyes at bedtime.     lisinopril 10 MG tablet  Commonly known as:  PRINIVIL,ZESTRIL  Take 10 mg by mouth daily.     loratadine 10 MG tablet  Commonly known as:  CLARITIN  Take 1 tablet (10 mg total) by mouth daily as needed for itching.     meclizine 25 MG tablet  Commonly known as:  ANTIVERT  Take 25 mg by mouth daily as needed for dizziness or nausea.     metoprolol 100 MG tablet  Commonly known as:  LOPRESSOR  Take 100 mg by mouth daily.     metoprolol 50 MG tablet  Commonly known as:  LOPRESSOR  Take 50 mg by mouth at bedtime.     multivitamin-lutein Caps capsule  Take 1 capsule by mouth 2 (two) times daily.     omeprazole 20 MG capsule  Commonly known as:  PRILOSEC  Take 20 mg by mouth daily.     tamsulosin 0.4 MG Caps capsule  Commonly known as:  FLOMAX  Take 0.4 mg by  mouth at bedtime.     warfarin 1 MG tablet  Commonly known as:  COUMADIN  Alternate 7 mg/6 mg nightly. Adjust to keep INR between 2.5 and 3.5       Allergies  Allergen Reactions  . Tetanus Toxoids Hives       Follow-up Information   Follow up with SNF provider within one week.       The results of significant diagnostics from this hospitalization (including imaging, microbiology, ancillary and laboratory) are listed below for reference.    Significant Diagnostic Studies: Dg Ribs Unilateral W/chest Left  10/17/2013   CLINICAL DATA:  Left anterior rib pain.  EXAM: LEFT RIBS AND CHEST - 3+ VIEW  COMPARISON:  PA and lateral chest 09/18/2013.  FINDINGS: The patient is status post CABG and aortic valve replacement. The lungs are clear. Heart size is normal. There is no pneumothorax or pleural fluid. No rib fracture is identified. A marker is placed in the region of concern.  IMPRESSION: No acute abnormality.   Electronically Signed   By: Drusilla Kanner M.D.   On: 10/17/2013 19:35   Dg Thoracic Spine W/swimmers  10/18/2013   CLINICAL DATA:  Status post fall; upper back pain.  Unable to walk.  EXAM: THORACIC SPINE - 2 VIEW + SWIMMERS  COMPARISON:  Chest radiograph performed 09/18/2013  FINDINGS: There is no evidence of fracture or subluxation. Vertebral bodies demonstrate normal height and alignment. Intervertebral disc spaces are preserved along the thoracic spine. There is mild narrowing of the intervertebral disc spaces at the lower cervical spine.  The visualized portions of both lungs are clear. The patient is status post median sternotomy, with evidence of prior CABG. An aortic valve replacement is noted. Clips are noted within the right upper quadrant, reflecting prior cholecystectomy.  IMPRESSION: No evidence of fracture or subluxation along the thoracic spine.   Electronically Signed   By: Roanna Raider M.D.   On: 10/18/2013 00:03   Dg Lumbar Spine Complete  10/18/2013   CLINICAL  DATA:  Status post fall; lower back pain and inability to walk.  EXAM: LUMBAR SPINE - COMPLETE 4+ VIEW  COMPARISON:  Lumbar spine radiographs performed 05/08/2013, and CT of the lumbar spine performed 05/22/2013  FINDINGS: There is no evidence of fracture or subluxation. Vertebral bodies demonstrate normal height and alignment. There is chronic narrowing of the intervertebral disc space at L5-S1, with associated sclerotic change. Narrowing of the intervertebral disc space is also noted at L1-L2.  The visualized bowel gas pattern is unremarkable in appearance; air and stool are noted within the colon. The sacroiliac joints are within normal limits. Clips  are noted within the right upper quadrant, reflecting prior cholecystectomy.  IMPRESSION: No evidence of fracture or subluxation along the lumbar spine.   Electronically Signed   By: Roanna Raider M.D.   On: 10/18/2013 00:06   Mr Thoracic Spine W Wo Contrast  10/18/2013   CLINICAL DATA:  Lower extremity weakness after fall.  EXAM: MRI THORACIC SPINE WITHOUT AND WITH CONTRAST  TECHNIQUE: Multiplanar and multiecho pulse sequences of the thoracic spine were obtained without and with intravenous contrast.  CONTRAST:  15mL MULTIHANCE GADOBENATE DIMEGLUMINE 529 MG/ML IV SOLN  COMPARISON:  Thoracic spine radiograph October 17, 2013.  FINDINGS: Mild STIR signal within the right aspect of the T3 vertebral body associated with the minimal height loss, sagittal STIR 5 /13 and axial T1 7/34. Thoracic vertebral bodies and posterior elements are otherwise intact and aligned and maintenance of thoracic kyphosis. Small acute T3 superior endplate Schmorl's node, no suspicious STIR signal to suggest acute fracture. Intervertebral disk morphology is generally preserved with decreased T2 signal within all disks most consistent with moderate desiccation. Mild ventral endplate spurring of the lower thoracic levels. No abnormal osseous nor intradiscal enhancement.  Thoracic spinal  cord appears normal in morphology and signal characteristics of the conus medullaris which terminates at L1. No abnormal cord, leptomeningeal nor epidural enhancement. Moderate symmetric paraspinal muscle atrophy. Included prevertebral soft tissues are nonsuspicious.  Partially imaged C7-T1 anterolisthesis result in mild canal stenosis, suspected congenital canal narrowing of the cervical spine on the localizer. Mild C7-T1 acute discogenic endplate changes.  Annular bulging at T2-3, without canal stenosis or neural foraminal narrowing at any thoracic level.  IMPRESSION: Mild abnormal STIR signal within the right aspect of T3 vertebral body, slight height loss, unclear whether this reflects fracture or Schmorl's node and acute discogenic endplate changes.  Mild degenerative change of the thoracic spine without canal stenosis or neural foraminal narrowing.  No abnormal enhancement within the thoracic spine.  Grade 1 C7-T1 anterolisthesis on apparent degenerative basis, incompletely imaged.   Electronically Signed   By: Awilda Metro   On: 10/18/2013 04:15   Mr Lumbar Spine W Wo Contrast  10/18/2013   CLINICAL DATA:  Lower extremity weakness after fall.  EXAM: MRI LUMBAR SPINE WITHOUT AND WITH CONTRAST  TECHNIQUE: Multiplanar and multiecho pulse sequences of the lumbar spine were obtained without and with intravenous contrast.  CONTRAST:  15mL MULTIHANCE GADOBENATE DIMEGLUMINE 529 MG/ML IV SOLN  COMPARISON:  Lumbar spine radiographs October 17, 2013 and CT of the lumbar spine. May 22, 2013  FINDINGS: The lumbar vertebral bodies and posterior elements remain intact and aligned with maintenance of the lumbar lordosis. Severe L1-2 disc height loss, moderate to severe L5-S1 disc height loss is similar, associated with moderate to severe subacute to chronic discogenic endplate changes. Mild ex to moderate acute on chronic enhancing discogenic endplate change at L2-3. Mild chronic discogenic endplate change at L4-5.  Decreased T2 signal within all lumbar disks most consistent with moderate desiccation. Scattered subcentimeter bright T1 and bright T2 foci lose signal on this fat sat sequence suggesting focal fat, no suspicious osseous nor intradiscal enhancement.  Conus medullaris terminates at L1 and appears normal in morphology and signal characteristics. Cauda equina is unremarkable. No abnormal cord, leptomeningeal nor epidural enhancement.  Bright T1 bone marrow signal in the sacrum most consistent with osteopenia. Moderate symmetric paraspinal muscle atrophy. Ectasia of the infrarenal aorta, measuring 2.8 cm.  Level by level evaluation:  L1-L2: 2 mm broad-based disc osteophyte complex, mild facet  arthropathy and ligamentum flavum redundancy without canal stenosis or neural foraminal narrowing.  L2-L3: 4 mm broad-based disk bulge, with mild enhancing annular tear. Mild facet arthropathy and ligamentum flavum redundancy. Minimal canal stenosis with partial effacement of the lateral recesses which likely affect the traversing L3 nurse. No neural foraminal narrowing.  L3-L4: 3 mm broad-based disk bulge with enhancing annular tear. Mild facet arthropathy and ligamentum flavum redundancy. Minimal canal stenosis, with partial effacement of lateral recesses which may affect the traversing L4 nerve. No neural foraminal narrowing.  L4-L5: 3 mm broad-based disk bulge asymmetric to the right with enhancing annular tear. Mild facet arthropathy and ligamentum flavum redundancy. No canal stenosis. Mild to moderate right neural foraminal narrowing.  L5-S1: No disk bulge. Mild facet arthropathy without canal stenosis. Mild bilateral neural foraminal narrowing.  IMPRESSION: Mild abnormal STIR signal within the right aspect of T3 vertebral body, slight height loss, is unclear whether this reflects fracture or Schmorl's node and acute discogenic endplate changes.  No acute fracture or malalignment. No abnormal enhancement of the lumbar spine.   Degenerative change of lumbar spine with minimal canal stenosis at L2-3 and L3-4. Neural foraminal narrowing L4-5 and L5-S1: Mild to moderate on the right at L4-5.  Multi-level enhancing annular tears.   Electronically Signed   By: Awilda Metro   On: 10/18/2013 04:23    Microbiology: No results found for this or any previous visit (from the past 240 hour(s)).   Labs:   Sodium           138    Sodium   Potassium           3.8    Potassium   Chloride           104    Chloride   CO2           25    CO2   BUN           26    BUN   Creatinine, Ser           1.46    Creatinine, Ser   Calcium           9.0    Calcium   GFR calc non Af Amer           42    GFR calc non Af Amer   GFR calc Af Amer           49     GFR calc Af Amer   Glucose, Bld           113    Glucose, Bld    CARDIAC PROFILE     Total CK           432    Total CK    OTHER CHEM     Vitamin B-12            209    Vitamin B-12    CBC     WBC           11.9  7.2  WBC   RBC           4.04  3.37  RBC   Hemoglobin           12.2  10.2  Hemoglobin   HCT           37.1  31.2  HCT   MCV           91.8  92.6  MCV   MCH           30.2  30.3  MCH   MCHC           32.9  32.7  MCHC   RDW           13.8  14.0  RDW   Platelets           180  136  Platelets    DIFFERENTIAL     Neutrophils Relative %           70    Neutrophils Relative %   Lymphocytes Relative           18    Lymphocytes Relative   Monocytes Relative           12    Monocytes Relative   Eosinophils Relative           0    Eosinophils Relative   Basophils Relative           0    Basophils Relative   Neutro Abs           8.3    Neutro Abs   Lymphs Abs           2.1    Lymphs Abs   Monocytes Absolute           1.4    Monocytes Absolute   Eosinophils Absolute           0.1    Eosinophils Absolute   Basophils Absolute           0.0    Basophils Absolute    PROTIME W/ INR     Prothrombin Time           29.1 26.5 26.8  Prothrombin Time   INR            2.87 2.54 2.58  INR    DIABETES     Glucose, Bld           113    Glucose, Bld    THYROID     TSH            1.150    TSH    URINALYSIS     Color, Urine           YELLOW    Color, Urine   APPearance           CLOUDY    APPearance   Specific Gravity, Urine           1.024    Specific Gravity, Urine   pH           5.0    pH   Glucose, UA           NEGATIVE    Glucose, UA   Bilirubin Urine           SMALL    Bilirubin Urine   Ketones, ur           15    Ketones, ur   Protein, ur           30    Protein, ur   Urobilinogen, UA           0.2    Urobilinogen, UA   Nitrite           NEGATIVE    Nitrite   Leukocytes, UA           NEGATIVE    Leukocytes,  UA   Hgb urine dipstick           TRACE    Hgb urine dipstick   Urine-Other           MUCOUS PRESENT    Urine-Other   WBC, UA           3-6    WBC, UA   RBC / HPF           3-6    RBC / HPF   Squamous Epithelial / LPF           RARE    Squamous Epithelial / LPF   Bacteria, UA           RARE    Bacteria, UA   Casts           HYALINE CASTS       EKG Normal sinus rhythm Cannot rule out Anterior infarct , age undetermined Abnormal ECG No previous ECGs available  Signed:  Kyuss Hale L  [facility] 10/20/2013, 12:22 PM

## 2013-10-20 NOTE — Progress Notes (Signed)
Pt to be d/c today to [facility].   Pt and family agreeable. Confirmed plans with facility.  Plan transfer via EMS.    Versie Fleener LCSWA  [facility]  4N 1-16;  6N1-16 Phone: [phone]    

## 2013-10-20 NOTE — Progress Notes (Signed)
Discharged to [facility] by [facility].Irena Reichmann

## 2014-04-18 ENCOUNTER — Telehealth: Payer: Self-pay | Admitting: Cardiovascular Disease

## 2014-04-18 NOTE — Telephone Encounter (Signed)
Returned call to patient's wife she stated husband needs appointment with Dr.Cooper.Stated he is due for his 6 month follow up and he did not receive reminder letter.Stated he is having sob.No swelling noticed.Stated she did not want a PA appointment.Message sent to Dr.Cooper's nurse for appointment.Wife stated call home # first then call work # [phone].

## 2014-04-18 NOTE — Telephone Encounter (Signed)
New Message:  Pt's wife is calling to see if her husband can be worked in to see Dr. Burt Knack. Does not want to see PA.Marland Kitchen Pt is c/o about not seeing well, breathing hard at times, and coughs "right much"... Pt's wife states if she cannot be reached at home then try her on her work number [phone]

## 2014-04-19 NOTE — Telephone Encounter (Signed)
No answer at home number and the work number is busy.

## 2014-04-20 NOTE — Telephone Encounter (Signed)
Follow up ° ° ° ° ° °Returning the nurses call ° ° ° ° ° ° °

## 2014-04-20 NOTE — Telephone Encounter (Signed)
I spoke with the pt's wife and made her aware that at this time I do not have any openings on MD schedule.  I told her that I would contact her if Dr Burt Knack had a cancellation on next weeks schedule. She agreed with plan.  [phone] or [phone]

## 2014-04-24 NOTE — Telephone Encounter (Signed)
I had a cancellation on 6/18 and scheduled the pt to see Dr Burt Knack on this date.  Pt's wife aware of appointment.

## 2014-04-26 ENCOUNTER — Encounter: Payer: Self-pay | Admitting: Cardiovascular Disease

## 2014-04-26 ENCOUNTER — Ambulatory Visit (INDEPENDENT_AMBULATORY_CARE_PROVIDER_SITE_OTHER): Payer: Medicare HMO | Admitting: Cardiovascular Disease

## 2014-04-26 VITALS — BP 180/82 | HR 50 | Ht 68.0 in | Wt 185.0 lb

## 2014-04-26 DIAGNOSIS — I25810 Atherosclerosis of coronary artery bypass graft(s) without angina pectoris: Secondary | ICD-10-CM

## 2014-04-26 DIAGNOSIS — I359 Nonrheumatic aortic valve disorder, unspecified: Secondary | ICD-10-CM

## 2014-04-26 DIAGNOSIS — E785 Hyperlipidemia, unspecified: Secondary | ICD-10-CM

## 2014-04-26 NOTE — Patient Instructions (Signed)
Your physician wants you to follow-up in: 6 MONTHS with Dr Cooper.  You will receive a reminder letter in the mail two months in advance. If you don't receive a letter, please call our office to schedule the follow-up appointment.  Your physician recommends that you continue on your current medications as directed. Please refer to the Current Medication list given to you today.  

## 2014-04-26 NOTE — Progress Notes (Signed)
HPI:  80 year old gentleman presenting for followup evaluation. He underwent remote [employer] mechanical aortic valve replacement in 1999 with concomitant CABG.He struggles with his vision. He had some shortness of breath at the time of his last visit. An echocardiogram showed normal LV function and normal prosthetic aortic valve function. The patient is tolerating Coumadin and denies any bleeding problems.  The patient has completely lost his vision since his last office visit. He's had declining vision for a long time related to macular degeneration. This is been a dramatic change in his lifestyle. He's essentially a wheelchair and unable to and delay without assistance. His wife has noted that he breathes hard, especially when he first lies down at night. The patient does not feel short of breath himself. He denies chest pain or palpitations. He's had no leg swelling.  Outpatient Encounter Prescriptions as of 04/26/2014  Medication Sig  . aspirin EC 81 MG tablet Take 81 mg by mouth daily.  . camphor-menthol (SARNA) lotion Apply topically 2 (two) times daily as needed for itching. To itchy area on right arm  . cyanocobalamin (,VITAMIN B-12,) 1000 MCG/ML injection 1000 mcg IM daily for 5 days, then weekly for 3 weeks, then monthly  . donepezil (ARICEPT) 5 MG tablet   . dorzolamide-timolol (COSOPT) 22.3-6.8 MG/ML ophthalmic solution Place 1 drop into both eyes daily.   Marland Kitchen latanoprost (XALATAN) 0.005 % ophthalmic solution Place 1 drop into both eyes at bedtime.   Marland Kitchen lisinopril (PRINIVIL,ZESTRIL) 10 MG tablet Take 10 mg by mouth daily.    . meclizine (ANTIVERT) 25 MG tablet Take 25 mg by mouth daily as needed for dizziness or nausea.   . metoprolol (LOPRESSOR) 100 MG tablet Take 100 mg by mouth daily.   . metoprolol (LOPRESSOR) 50 MG tablet Take 50 mg by mouth at bedtime.  . multivitamin-lutein (OCUVITE-LUTEIN) CAPS Take 1 capsule by mouth 2 (two) times daily.    Marland Kitchen omeprazole (PRILOSEC) 20 MG capsule  Take 20 mg by mouth daily.    . predniSONE (DELTASONE) 10 MG tablet   . Tamsulosin HCl (FLOMAX) 0.4 MG CAPS Take 0.4 mg by mouth at bedtime.   Marland Kitchen warfarin (COUMADIN) 1 MG tablet Alternate 7 mg/6 mg nightly. Adjust to keep INR between 2.5 and 3.5  . [DISCONTINUED] loratadine (CLARITIN) 10 MG tablet Take 1 tablet (10 mg total) by mouth daily as needed for itching.    Allergies  Allergen Reactions  . Tetanus Toxoids Hives    Past Medical History  Diagnosis Date  . CAD (coronary artery disease)     s/p CABG 1999  . HLD (hyperlipidemia)   . HTN (hypertension)   . GERD (gastroesophageal reflux disease)   . H/O hiatal hernia   . Arthritis   . Peripheral vascular disease     aneursym  . Aortic aneurysm     at least 3 cmm infrarenal AAA by lumbar CT 05/22/13 [facility]); 41 mm proximal ascending aorta on echo 05/05/13 ([place])    ROS: Negative except as per HPI  BP 180/82  Pulse 50  Ht 5\' 8"  (1.727 m)  Wt 83.915 kg (185 lb)  BMI 28.14 kg/m2  PHYSICAL EXAM: Pt is alert and oriented, elderly male in NAD HEENT: normal Neck: JVP - normal, carotids 2+= without bruits Lungs: CTA bilaterally CV: RRR with soft ejection murmur at the RUSB and normal mechanical A2.  Abd: soft, NT, Positive BS, no hepatomegaly Ext: no C/C/E, distal pulses intact and equal Skin: warm/dry no rash  EKG:  Sinus bradycardia 50 beats per minute, first degree AV block, otherwise within normal limits.  2D Echo 05/05/2013: Left ventricle: The cavity size was normal. Wall thickness was normal. Systolic function was normal. The estimated ejection fraction was in the range of 60% to 65%. Doppler parameters are consistent with abnormal left ventricular relaxation (grade 1 diastolic dysfunction).  ------------------------------------------------------------ Aortic valve: AV prosthesis is difficult to see well Peak and mean gradients through the valve are 14 and 6 mm Hg respectively. Doppler: No  significant regurgitation. VTI ratio of LVOT to aortic valve: 0.8. Peak velocity ratio of LVOT to aortic valve: 0.62. Mean gradient: 34mm Hg (S). Peak gradient: 15mm Hg (S).  ------------------------------------------------------------ Aorta: Proximal ascending aorta is mildly dilated at 41 mm.  ------------------------------------------------------------ Mitral valve: Mildly thickened leaflets . Doppler: Trivial regurgitation.  ------------------------------------------------------------ Left atrium: The atrium was normal in size.  ------------------------------------------------------------ Right ventricle: The cavity size was normal. Wall thickness was normal. Systolic function was normal.  ------------------------------------------------------------ Pulmonic valve: Structurally normal valve. Cusp separation was normal. Doppler: Transvalvular velocity was within the normal range. Trivial regurgitation.  ------------------------------------------------------------ Tricuspid valve: Structurally normal valve. Leaflet separation was normal. Doppler: Transvalvular velocity was within the normal range. Mild regurgitation.  ------------------------------------------------------------ Right atrium: The atrium was normal in size.  ------------------------------------------------------------ Pericardium: There was no pericardial effusion.  ------------------------------------------------------------ Systemic veins: Inferior vena cava: The vessel was normal in size; the respirophasic diameter changes were in the normal range (= 50%); findings are consistent with normal central venous pressure.  ------------------------------------------------------------ Post procedure conclusions Ascending Aorta:  - Proximal ascending aorta is mildly dilated at 41 mm.  ASSESSMENT AND PLAN: 1. Aortic valve disease status post mechanical aVR remotely. Exam remains stable. Echocardiogram 1 year  ago reviewed and shows normal function of his aortic valve prosthesis. He has no bleeding on long-term warfarin. Continue same medical program. I will see him back in 6 months for followup.  2. Hypertension. Blood pressure is elevated today. I rechecked his blood pressure is 180/85 on my check. Reports earlier this week his blood pressure was in the 120s over 70s at his primary physician's office. He's just been started on prednisone and maybe this is driving his blood pressure up. They will keep a nondominant I did not make any changes today.  3. Shortness of breath. Suspect deconditioning is the primary issue. His lung fields are clear. His echo was normal last year. Possibilities developed some diastolic dysfunction with his long-standing aortic valve disease. They did not want to try diuretics which I offered him today.  [name] 04/26/2014 4:37 PM

## 2014-05-06 ENCOUNTER — Encounter (HOSPITAL_COMMUNITY): Payer: Self-pay | Admitting: Emergency Medicine

## 2014-05-06 ENCOUNTER — Emergency Department (HOSPITAL_COMMUNITY): Payer: Medicare HMO

## 2014-05-06 ENCOUNTER — Inpatient Hospital Stay (HOSPITAL_COMMUNITY)
Admission: EM | Admit: 2014-05-06 | Discharge: 2014-05-14 | DRG: 480 | Disposition: A | Discharge status: Skilled Nursing Facility | Payer: Medicare HMO | Attending: Internal Medicine | Admitting: Internal Medicine

## 2014-05-06 ENCOUNTER — Inpatient Hospital Stay (HOSPITAL_COMMUNITY): Payer: Medicare HMO

## 2014-05-06 DIAGNOSIS — D649 Anemia, unspecified: Secondary | ICD-10-CM

## 2014-05-06 DIAGNOSIS — I5033 Acute on chronic diastolic (congestive) heart failure: Secondary | ICD-10-CM | POA: Clinically undetermined

## 2014-05-06 DIAGNOSIS — Z951 Presence of aortocoronary bypass graft: Secondary | ICD-10-CM

## 2014-05-06 DIAGNOSIS — N179 Acute kidney failure, unspecified: Secondary | ICD-10-CM | POA: Diagnosis present

## 2014-05-06 DIAGNOSIS — Y92009 Unspecified place in unspecified non-institutional (private) residence as the place of occurrence of the external cause: Secondary | ICD-10-CM

## 2014-05-06 DIAGNOSIS — S72123A Displaced fracture of lesser trochanter of unspecified femur, initial encounter for closed fracture: Secondary | ICD-10-CM

## 2014-05-06 DIAGNOSIS — Z7901 Long term (current) use of anticoagulants: Secondary | ICD-10-CM

## 2014-05-06 DIAGNOSIS — N189 Chronic kidney disease, unspecified: Secondary | ICD-10-CM | POA: Diagnosis present

## 2014-05-06 DIAGNOSIS — S7290XA Unspecified fracture of unspecified femur, initial encounter for closed fracture: Secondary | ICD-10-CM

## 2014-05-06 DIAGNOSIS — I509 Heart failure, unspecified: Secondary | ICD-10-CM | POA: Diagnosis present

## 2014-05-06 DIAGNOSIS — I129 Hypertensive chronic kidney disease with stage 1 through stage 4 chronic kidney disease, or unspecified chronic kidney disease: Secondary | ICD-10-CM | POA: Diagnosis present

## 2014-05-06 DIAGNOSIS — C7951 Secondary malignant neoplasm of bone: Secondary | ICD-10-CM | POA: Diagnosis present

## 2014-05-06 DIAGNOSIS — I10 Essential (primary) hypertension: Secondary | ICD-10-CM

## 2014-05-06 DIAGNOSIS — E861 Hypovolemia: Secondary | ICD-10-CM | POA: Diagnosis not present

## 2014-05-06 DIAGNOSIS — Z66 Do not resuscitate: Secondary | ICD-10-CM | POA: Diagnosis present

## 2014-05-06 DIAGNOSIS — W010XXA Fall on same level from slipping, tripping and stumbling without subsequent striking against object, initial encounter: Secondary | ICD-10-CM | POA: Diagnosis present

## 2014-05-06 DIAGNOSIS — S7291XA Unspecified fracture of right femur, initial encounter for closed fracture: Secondary | ICD-10-CM

## 2014-05-06 DIAGNOSIS — K219 Gastro-esophageal reflux disease without esophagitis: Secondary | ICD-10-CM | POA: Diagnosis present

## 2014-05-06 DIAGNOSIS — D62 Acute posthemorrhagic anemia: Secondary | ICD-10-CM | POA: Diagnosis not present

## 2014-05-06 DIAGNOSIS — Z954 Presence of other heart-valve replacement: Secondary | ICD-10-CM

## 2014-05-06 DIAGNOSIS — S72121A Displaced fracture of lesser trochanter of right femur, initial encounter for closed fracture: Secondary | ICD-10-CM

## 2014-05-06 DIAGNOSIS — Z952 Presence of prosthetic heart valve: Secondary | ICD-10-CM

## 2014-05-06 DIAGNOSIS — C61 Malignant neoplasm of prostate: Secondary | ICD-10-CM | POA: Diagnosis present

## 2014-05-06 DIAGNOSIS — W19XXXA Unspecified fall, initial encounter: Secondary | ICD-10-CM

## 2014-05-06 DIAGNOSIS — C7952 Secondary malignant neoplasm of bone marrow: Secondary | ICD-10-CM

## 2014-05-06 DIAGNOSIS — Z7982 Long term (current) use of aspirin: Secondary | ICD-10-CM

## 2014-05-06 DIAGNOSIS — I2510 Atherosclerotic heart disease of native coronary artery without angina pectoris: Secondary | ICD-10-CM | POA: Diagnosis present

## 2014-05-06 DIAGNOSIS — I5032 Chronic diastolic (congestive) heart failure: Secondary | ICD-10-CM

## 2014-05-06 DIAGNOSIS — S72121K Displaced fracture of lesser trochanter of right femur, subsequent encounter for closed fracture with nonunion: Secondary | ICD-10-CM

## 2014-05-06 DIAGNOSIS — F0390 Unspecified dementia without behavioral disturbance: Secondary | ICD-10-CM | POA: Diagnosis present

## 2014-05-06 DIAGNOSIS — E538 Deficiency of other specified B group vitamins: Secondary | ICD-10-CM | POA: Diagnosis present

## 2014-05-06 DIAGNOSIS — I359 Nonrheumatic aortic valve disorder, unspecified: Secondary | ICD-10-CM

## 2014-05-06 DIAGNOSIS — S7290XD Unspecified fracture of unspecified femur, subsequent encounter for closed fracture with routine healing: Secondary | ICD-10-CM

## 2014-05-06 DIAGNOSIS — Z87891 Personal history of nicotine dependence: Secondary | ICD-10-CM

## 2014-05-06 DIAGNOSIS — H548 Legal blindness, as defined in USA: Secondary | ICD-10-CM | POA: Diagnosis present

## 2014-05-06 DIAGNOSIS — M84453A Pathological fracture, unspecified femur, initial encounter for fracture: Principal | ICD-10-CM | POA: Diagnosis present

## 2014-05-06 DIAGNOSIS — N183 Chronic kidney disease, stage 3 unspecified: Secondary | ICD-10-CM | POA: Diagnosis present

## 2014-05-06 DIAGNOSIS — I739 Peripheral vascular disease, unspecified: Secondary | ICD-10-CM | POA: Diagnosis present

## 2014-05-06 DIAGNOSIS — E785 Hyperlipidemia, unspecified: Secondary | ICD-10-CM | POA: Diagnosis present

## 2014-05-06 LAB — CBC
HEMATOCRIT: 28.5 % — AB (ref 39.0–52.0)
Hemoglobin: 9.1 g/dL — ABNORMAL LOW (ref 13.0–17.0)
MCH: 29.5 pg (ref 26.0–34.0)
MCHC: 31.9 g/dL (ref 30.0–36.0)
MCV: 92.5 fL (ref 78.0–100.0)
Platelets: 205 10*3/uL (ref 150–400)
RBC: 3.08 MIL/uL — AB (ref 4.22–5.81)
RDW: 14.4 % (ref 11.5–15.5)
WBC: 12.7 10*3/uL — AB (ref 4.0–10.5)

## 2014-05-06 LAB — BASIC METABOLIC PANEL
BUN: 24 mg/dL — AB (ref 6–23)
CALCIUM: 8.1 mg/dL — AB (ref 8.4–10.5)
CHLORIDE: 104 meq/L (ref 96–112)
CO2: 25 mEq/L (ref 19–32)
CREATININE: 1.5 mg/dL — AB (ref 0.50–1.35)
GFR calc Af Amer: 47 mL/min — ABNORMAL LOW (ref >90)
GFR calc non Af Amer: 41 mL/min — ABNORMAL LOW (ref >90)
GLUCOSE: 151 mg/dL — AB (ref 70–99)
Potassium: 4.2 mEq/L (ref 3.7–5.3)
Sodium: 141 mEq/L (ref 137–147)

## 2014-05-06 LAB — PROTIME-INR
INR: 2.88 — ABNORMAL HIGH (ref 0.00–1.49)
Prothrombin Time: 30.2 seconds — ABNORMAL HIGH (ref 11.6–15.2)

## 2014-05-06 MED ORDER — WARFARIN SODIUM 6 MG PO TABS
6.0000 mg | ORAL_TABLET | ORAL | Status: DC
  Administered 2014-05-07: 6 mg via ORAL
  Filled 2014-05-06 (×2): qty 1

## 2014-05-06 MED ORDER — METOPROLOL TARTRATE 50 MG PO TABS
50.0000 mg | ORAL_TABLET | Freq: Two times a day (BID) | ORAL | Status: DC
  Administered 2014-05-06: 50 mg via ORAL
  Filled 2014-05-06 (×4): qty 1

## 2014-05-06 MED ORDER — PANTOPRAZOLE SODIUM 40 MG PO TBEC
40.0000 mg | DELAYED_RELEASE_TABLET | Freq: Every day | ORAL | Status: DC
  Administered 2014-05-07 – 2014-05-14 (×7): 40 mg via ORAL
  Filled 2014-05-06 (×6): qty 1

## 2014-05-06 MED ORDER — TAMSULOSIN HCL 0.4 MG PO CAPS
0.4000 mg | ORAL_CAPSULE | Freq: Every day | ORAL | Status: DC
  Administered 2014-05-06 – 2014-05-13 (×8): 0.4 mg via ORAL
  Filled 2014-05-06 (×9): qty 1

## 2014-05-06 MED ORDER — MORPHINE SULFATE 2 MG/ML IJ SOLN
2.0000 mg | Freq: Once | INTRAMUSCULAR | Status: AC
  Administered 2014-05-06: 2 mg via INTRAVENOUS
  Filled 2014-05-06: qty 1

## 2014-05-06 MED ORDER — CYANOCOBALAMIN 1000 MCG/ML IJ SOLN
1000.0000 ug | INTRAMUSCULAR | Status: DC
  Filled 2014-05-06: qty 1

## 2014-05-06 MED ORDER — DONEPEZIL HCL 5 MG PO TABS
5.0000 mg | ORAL_TABLET | Freq: Every day | ORAL | Status: DC
  Administered 2014-05-06 – 2014-05-13 (×8): 5 mg via ORAL
  Filled 2014-05-06 (×10): qty 1

## 2014-05-06 MED ORDER — ACETAMINOPHEN 650 MG RE SUPP: 650.0000 mg | Freq: Four times a day (QID) | RECTAL | Status: DC | PRN

## 2014-05-06 MED ORDER — WARFARIN - PHARMACIST DOSING INPATIENT: Freq: Every day | Status: DC

## 2014-05-06 MED ORDER — OCUVITE-LUTEIN PO CAPS
1.0000 | ORAL_CAPSULE | Freq: Two times a day (BID) | ORAL | Status: DC
  Administered 2014-05-06 – 2014-05-14 (×15): 1 via ORAL
  Filled 2014-05-06 (×20): qty 1

## 2014-05-06 MED ORDER — ONDANSETRON HCL 4 MG PO TABS: 4.0000 mg | ORAL_TABLET | Freq: Four times a day (QID) | ORAL | Status: DC | PRN

## 2014-05-06 MED ORDER — DORZOLAMIDE HCL-TIMOLOL MAL 2-0.5 % OP SOLN
1.0000 [drp] | Freq: Two times a day (BID) | OPHTHALMIC | Status: DC
  Administered 2014-05-06 – 2014-05-14 (×15): 1 [drp] via OPHTHALMIC
  Filled 2014-05-06 (×2): qty 10

## 2014-05-06 MED ORDER — MORPHINE SULFATE 2 MG/ML IJ SOLN: 2.0000 mg | INTRAMUSCULAR | Status: DC | PRN

## 2014-05-06 MED ORDER — ONDANSETRON HCL 4 MG/2ML IJ SOLN
4.0000 mg | Freq: Four times a day (QID) | INTRAMUSCULAR | Status: DC | PRN
  Filled 2014-05-06: qty 2

## 2014-05-06 MED ORDER — MECLIZINE HCL 25 MG PO TABS
25.0000 mg | ORAL_TABLET | Freq: Every day | ORAL | Status: DC | PRN
  Filled 2014-05-06: qty 1

## 2014-05-06 MED ORDER — WARFARIN SODIUM 6 MG PO TABS
7.0000 mg | ORAL_TABLET | ORAL | Status: DC
  Filled 2014-05-06: qty 1

## 2014-05-06 MED ORDER — ASPIRIN EC 81 MG PO TBEC
81.0000 mg | DELAYED_RELEASE_TABLET | Freq: Every day | ORAL | Status: DC
  Administered 2014-05-07 – 2014-05-14 (×7): 81 mg via ORAL
  Filled 2014-05-06 (×9): qty 1

## 2014-05-06 MED ORDER — LATANOPROST 0.005 % OP SOLN
1.0000 [drp] | Freq: Every day | OPHTHALMIC | Status: DC
  Administered 2014-05-06 – 2014-05-13 (×8): 1 [drp] via OPHTHALMIC
  Filled 2014-05-06 (×2): qty 2.5

## 2014-05-06 MED ORDER — ACETAMINOPHEN 325 MG PO TABS
650.0000 mg | ORAL_TABLET | Freq: Four times a day (QID) | ORAL | Status: DC | PRN
  Administered 2014-05-07 – 2014-05-12 (×7): 650 mg via ORAL
  Filled 2014-05-06 (×8): qty 2

## 2014-05-06 MED ORDER — LISINOPRIL 10 MG PO TABS
10.0000 mg | ORAL_TABLET | Freq: Every day | ORAL | Status: DC
  Filled 2014-05-06 (×2): qty 1

## 2014-05-06 MED ORDER — ONDANSETRON HCL 4 MG/2ML IJ SOLN
4.0000 mg | Freq: Once | INTRAMUSCULAR | Status: AC
  Administered 2014-05-06: 4 mg via INTRAVENOUS
  Filled 2014-05-06: qty 2

## 2014-05-06 NOTE — ED Notes (Signed)
A cup of water and dinner tray ordered for pt with EDP approval.

## 2014-05-06 NOTE — ED Notes (Signed)
Attempted report 

## 2014-05-06 NOTE — H&P (Signed)
[facility] History and Physical  Clayton [name] EHM:[id number] DOB: [birth year] DOA: 05/06/2014  Referring physician:  PCP: [name], MD  Specialists:   Chief Complaint: fall, femoral fracture   HPI: Clayton [name] is a 80 y.o. male with PMH of HTN, HPL, CAD s/p CABG, s/p AVR, legally blind presented with mechanical fall and found to have trochanteric fracture R; Patient fell last night while walking to bathroom nto using his cane/walker; denies head trauma, no dizziness, no LOC, no focal weakness prior to fall, no chest pain, no SOB, no fever, chills, no nausea, vomiting or diarrhea;   Review of Systems: The patient denies anorexia, fever, weight loss,, vision loss, decreased hearing, hoarseness, chest pain, syncope, dyspnea on exertion, peripheral edema, balance deficits, hemoptysis, abdominal pain, melena, hematochezia, severe indigestion/heartburn, hematuria, incontinence, genital sores, muscle weakness, suspicious skin lesions, transient blindness, difficulty walking, depression, unusual weight change, abnormal bleeding, enlarged lymph nodes, angioedema, and breast masses.    Past Medical History  Diagnosis Date  . CAD (coronary artery disease)     s/p CABG 1999  . HLD (hyperlipidemia)   . HTN (hypertension)   . GERD (gastroesophageal reflux disease)   . H/O hiatal hernia   . Arthritis   . Peripheral vascular disease     aneursym  . Aortic aneurysm     at least 3 cmm infrarenal AAA by lumbar CT 05/22/13 [facility]); 41 mm proximal ascending aorta on echo 05/05/13 [facility])   Past Surgical History  Procedure Laterality Date  . Aortic valve replacement  1998  . Coronary artery bypass graft  1998  . Cholecystectomy    . Tonsillectomy    . US extremity*l* Left     arm  . Inguinal hernia repair  09/25/2013    with mesh  . Inguinal hernia repair Right 09/25/2013    Procedure: HERNIA REPAIR INGUINAL ADULT;  Surgeon: Imogene Burn. Georgette Dover, MD;  Location: [place];  Service:  General;  Laterality: Right;  . Insertion of mesh Right 09/25/2013    Procedure: INSERTION OF MESH;  Surgeon: Imogene Burn. Georgette Dover, MD;  Location: [place];  Service: General;  Laterality: Right;  . Hand surgery Left     MULTIPLE    Social History:  reports that he has quit smoking. He has never used smokeless tobacco. He reports that he does not drink alcohol or use illicit drugs. Home;  where does patient live--home, ALF, SNF? and with whom if at home? No;  Can patient participate in ADLs?  Allergies  Allergen Reactions  . Tetanus Toxoids Hives    Family History  Problem Relation Age of Onset  . Coronary artery disease      (be sure to complete)  Prior to Admission medications   Medication Sig Start Date End Date Taking? Authorizing Provider  aspirin EC 81 MG tablet Take 81 mg by mouth daily.   Yes Historical Provider, MD  camphor-menthol Timoteo Ace) lotion Apply topically 2 (two) times daily as needed for itching. To itchy area on right arm 10/20/13  Yes Delfina Redwood, MD  cyanocobalamin (,VITAMIN B-12,) 1000 MCG/ML injection 1000 mcg IM daily for 5 days, then weekly for 3 weeks, then monthly 10/20/13  Yes Delfina Redwood, MD  donepezil (ARICEPT) 5 MG tablet Take 5 mg by mouth at bedtime.  02/26/14  Yes Historical Provider, MD  dorzolamide-timolol (COSOPT) 22.3-6.8 MG/ML ophthalmic solution Place 1 drop into both eyes 2 (two) times daily.  07/26/13  Yes Historical Provider, MD  latanoprost (  XALATAN) 0.005 % ophthalmic solution Place 1 drop into both eyes at bedtime.  07/26/13  Yes Historical Provider, MD  lisinopril (PRINIVIL,ZESTRIL) 10 MG tablet Take 10 mg by mouth daily.     Yes Historical Provider, MD  meclizine (ANTIVERT) 25 MG tablet Take 25 mg by mouth daily as needed for dizziness or nausea.  05/19/13  Yes Historical Provider, MD  metoprolol (LOPRESSOR) 100 MG tablet Take 100 mg by mouth daily.    Yes Historical Provider, MD  metoprolol (LOPRESSOR) 50 MG tablet Take 50 mg by mouth  at bedtime.   Yes Historical Provider, MD  multivitamin-lutein (OCUVITE-LUTEIN) CAPS Take 1 capsule by mouth 2 (two) times daily.     Yes Historical Provider, MD  omeprazole (PRILOSEC) 20 MG capsule Take 20 mg by mouth daily.     Yes Historical Provider, MD  Tamsulosin HCl (FLOMAX) 0.4 MG CAPS Take 0.4 mg by mouth at bedtime.    Yes Historical Provider, MD  warfarin (COUMADIN) 1 MG tablet Take 1 mg by mouth daily. Takes along with the 5mg  tablet to equal 7mg  on Sunday and Thursday.  6mg  all other days of the week.   Yes Historical Provider, MD  warfarin (COUMADIN) 5 MG tablet Take 5 mg by mouth every evening. Takes along with the 1mg  tablet to equal 7mg  on Sunday and Thursday.  6mg  all other days of the week.   Yes Historical Provider, MD   Physical Exam: Filed Vitals:   05/06/14 1630  BP: 112/58  Pulse: 69  Temp:   Resp: 22     General:  Alert,   Eyes: BL blind  ENT: no oral ulcer s  Neck: supple  Cardiovascular: s1,s2 syst mr rrr  Respiratory: CTA BL  Abdomen: soft, nt, nd   Skin: no rash   Musculoskeletal: no LE edema, ROM R leg restricted due to pian   Psychiatric: no hallucinations   Neurologic: CN 2-12 intact,.r leg restricted due to pain   Labs on Admission:  Basic Metabolic Panel:  Recent Labs Lab 05/06/14 1216  NA 141  K 4.2  CL 104  CO2 25  GLUCOSE 151*  BUN 24*  CREATININE 1.50*  CALCIUM 8.1*   Liver Function Tests: No results found for this basename: AST, ALT, ALKPHOS, BILITOT, PROT, ALBUMIN,  in the last 168 hours No results found for this basename: LIPASE, AMYLASE,  in the last 168 hours No results found for this basename: AMMONIA,  in the last 168 hours CBC:  Recent Labs Lab 05/06/14 1216  WBC 12.7*  HGB 9.1*  HCT 28.5*  MCV 92.5  PLT 205   Cardiac Enzymes: No results found for this basename: CKTOTAL, CKMB, CKMBINDEX, TROPONINI,  in the last 168 hours  BNP (last 3 results) No results found for this basename: PROBNP,  in the last  8760 hours CBG: No results found for this basename: GLUCAP,  in the last 168 hours  Radiological Exams on Admission: Dg Chest 1 View  05/06/2014   CLINICAL DATA:  Fall, right hip pain  EXAM: CHEST - 1 VIEW  COMPARISON:  10/17/2013  FINDINGS: Cardiomediastinal silhouette is stable. Status post CABG. No acute infiltrate or pleural effusion. No pulmonary edema.  IMPRESSION: No active disease.  Status post CABG.   Electronically Signed   By: Lahoma Crocker M.D.   On: 05/06/2014 13:35   Dg Pelvis 1-2 Views  05/06/2014   CLINICAL DATA:  Fall  EXAM: PELVIS - 1-2 VIEW  COMPARISON:  None.  FINDINGS:  There is a displaced fracture of the lesser trochanter. There is also lucency in the intertrochanteric region medially. There is slight varus deformity of the proximal femur. Impaction intertrochanteric fracture is not excluded. Osteopenia.  IMPRESSION: Avulsed fracture of the lesser trochanter.  Possible intertrochanteric femur fracture.  CT is recommended.   Electronically Signed   By: Maryclare Isaacson M.D.   On: 05/06/2014 13:37   Dg Femur Right  05/06/2014   CLINICAL DATA:  Fall  EXAM: RIGHT FEMUR - 2 VIEW  COMPARISON:  None.  FINDINGS: The lesser trochanter is avulsed and displaced superiorly. There are overlapping structures at the intertrochanteric region. There is also slight varus deformity of the proximal femur. Underlying intertrochanteric fracture is not excluded.  IMPRESSION: Avulsed fracture of the right lesser trochanter. There may be an intertrochanteric fracture. Attention on pelvic radiograph is recommended.   Electronically Signed   By: Maryclare Mateja M.D.   On: 05/06/2014 13:35   Ct Head Wo Contrast  05/06/2014   CLINICAL DATA:  Fall  EXAM: CT HEAD WITHOUT CONTRAST  TECHNIQUE: Contiguous axial images were obtained from the base of the skull through the vertex without intravenous contrast.  COMPARISON:  None.  FINDINGS: Global atrophy. Chronic ischemic changes in the periventricular white matter. Cranium is  intact. Mastoid air cells clear. Visualized paranasal sinuses are clear.  IMPRESSION: No acute intracranial pathology.   Electronically Signed   By: Maryclare Wooldridge M.D.   On: 05/06/2014 13:04   Ct Hip Right Wo Contrast  05/06/2014   CLINICAL DATA:  RIGHT hip pain. Fracture identified on prior radiographs.  EXAM: CT OF THE RIGHT HIP WITHOUT CONTRAST  TECHNIQUE: Multidetector CT imaging was performed according to the standard protocol. Multiplanar CT image reconstructions were also generated.  COMPARISON:  Radiographs 05/06/2014. CT 05/01/2013 at [facility].  FINDINGS: There is an avulsion of the RIGHT lesser trochanter with anterior displacement of 4.3 cm. Proximal retraction is mild, measuring about 2 cm. On the coronal images, there is sclerosis surrounding the lesser trochanter, suggesting an underlying lesion, likely metastatic disease. No intertrochanteric extension of fracture is identified. Moderate RIGHT hip osteoarthritis is present with subchondral cyst in the femoral head and acetabular roof. Fatty atrophy of the gluteus minimus muscle is present. Small nonspecific sclerotic lesion present in the RIGHT iliac bone. Ossification of the L5-S1 disc with ankylosis. Benign bone island in the RIGHT iliac crest. Aortoiliac atherosclerosis.  IMPRESSION: Displaced avulsion fracture of the RIGHT lesser trochanter. Crescentic sclerosis around the location of the avulsed fragment suggests an underlying osseous lesion. This crescentic sclerosis is new compared to the prior CT and is compatible with an underlying osseous metastatic lesion. Followup whole-body bone scan recommended to assess for other foci of metastatic disease. In this older man, PSA should also be obtained.   Electronically Signed   By: Dereck Ligas M.D.   On: 05/06/2014 15:39    EKG: Independently reviewed.   Assessment/Plan Principal Problem:   Closed fracture of lesser trochanter of femur Active Problems:   AORTIC VALVE REPLACEMENT,  HX OF   Fall at home  80 y.o. male with PMH of HTN, HPL, CAD s/p CABG, s/p AVR, dementia, legally blind presented with mechanical fall and found to have trochanteric fracture R;  1. Fall mechanical with avulsion fracture of the lessor trochanter -per ortho: no surgical intervention at this time; cont pain control, obtain PT eval; f/u UA r/o UTI  2. Crescentic sclerosis around the location of the avulsed fragment suggests an  underlying osseous metastatic lesion. -? Prostate vs MM or mets from unknown origin;  -obtain MRI right hip, whole-body bone scan/PSA r/o prostate CA, and bone skeletal survey ? MM (spep, obtain UA if protein needs UPEP); will need oncology f/u  3. CAD s/p CABG; denies acute cardiopulmonary symptoms   -cont home regimen  4. s/p AVR; cont coumadin per pharmacy  5. Anemia, CKD, back pains; f/u MM profile  6. Anemia, no s/ s of acute bleeding; check iron profile; h/o b12 def; recheck b12, folate    D/w patient, confirmed with his family  -Pt is DNR   Ortho;  if consultant consulted, please document name and whether formally or informally consulted  Code Status: DNR (must indicate code status--if unknown or must be presumed, indicate so) Family Communication: d/w patient, family at the bedsdie (indicate person spoken with, if applicable, with phone number if by telephone) Disposition Plan: pend PT eval  (indicate anticipated LOS)  Time spent: >35 minutes   Kinnie Feil [facility] Pager [phone]  If 7PM-7AM, please contact night-coverage [email or web address] Password [facility] 05/06/2014, 5:00 PM

## 2014-05-06 NOTE — ED Provider Notes (Addendum)
CSN: [id number]     Arrival date & time 05/06/14  1133 History   First MD Initiated Contact with Patient 05/06/14 1147     Chief Complaint  Patient presents with  . Fall    The patient's wife said the patient went in to the bathroom and shortly after he yelled for her.  The wife said she found him on the floor.       (Consider location/radiation/quality/duration/timing/severity/associated sxs/prior Treatment) HPI Comments: Patient presents to the ER for evaluation of right hip and upper leg pain after a fall. Patient fell last night. Patient reports that he was in the bathroom, reached for the CABG it and missed it, causing him to fall to the ground. He fell onto the right hip area. He does not think he hit his head. There was no loss of consciousness. Patient has been experiencing pain in the right thigh and hip area since the fall. Pain is moderate to severe and worsens with movement. Denies neck and back pain.  Patient is a 80 y.o. male presenting with fall.  Fall    Past Medical History  Diagnosis Date  . CAD (coronary artery disease)     s/p CABG 1999  . HLD (hyperlipidemia)   . HTN (hypertension)   . GERD (gastroesophageal reflux disease)   . H/O hiatal hernia   . Arthritis   . Peripheral vascular disease     aneursym  . Aortic aneurysm     at least 3 cmm infrarenal AAA by lumbar CT 05/22/13 [facility]); 41 mm proximal ascending aorta on echo 05/05/13 [facility])   Past Surgical History  Procedure Laterality Date  . Aortic valve replacement  1998  . Coronary artery bypass graft  1998  . Cholecystectomy    . Tonsillectomy    . US extremity*l* Left     arm  . Inguinal hernia repair  09/25/2013    with mesh  . Inguinal hernia repair Right 09/25/2013    Procedure: HERNIA REPAIR INGUINAL ADULT;  Surgeon: Imogene Burn. Georgette Dover, MD;  Location: [place];  Service: General;  Laterality: Right;  . Insertion of mesh Right 09/25/2013    Procedure: INSERTION OF MESH;  Surgeon:  Imogene Burn. Georgette Dover, MD;  Location: [place] [facility];  Service: General;  Laterality: Right;  . Hand surgery Left     MULTIPLE    Family History  Problem Relation Age of Onset  . Coronary artery disease     History  Substance Use Topics  . Smoking status: Former Research scientist (life sciences)  . Smokeless tobacco: Never Used     Comment: quit smoking 45 years ago "  . Alcohol Use: No    Review of Systems  Musculoskeletal: Positive for arthralgias. Negative for back pain and neck pain.  All other systems reviewed and are negative.     Allergies  Tetanus toxoids  Home Medications   Prior to Admission medications   Medication Sig Start Date End Date Taking? Authorizing Angelo Prindle  aspirin EC 81 MG tablet Take 81 mg by mouth daily.   Yes Historical Qadir Folks, MD  camphor-menthol Timoteo Ace) lotion Apply topically 2 (two) times daily as needed for itching. To itchy area on right arm 10/20/13  Yes Delfina Redwood, MD  cyanocobalamin (,VITAMIN B-12,) 1000 MCG/ML injection 1000 mcg IM daily for 5 days, then weekly for 3 weeks, then monthly 10/20/13  Yes Delfina Redwood, MD  donepezil (ARICEPT) 5 MG tablet Take 5 mg by mouth at bedtime.  02/26/14  Yes  Historical Camila Norville, MD  dorzolamide-timolol (COSOPT) 22.3-6.8 MG/ML ophthalmic solution Place 1 drop into both eyes 2 (two) times daily.  07/26/13  Yes Historical Rony Ratz, MD  latanoprost (XALATAN) 0.005 % ophthalmic solution Place 1 drop into both eyes at bedtime.  07/26/13  Yes Historical Beatris Belen, MD  lisinopril (PRINIVIL,ZESTRIL) 10 MG tablet Take 10 mg by mouth daily.     Yes Historical Denecia Brunette, MD  meclizine (ANTIVERT) 25 MG tablet Take 25 mg by mouth daily as needed for dizziness or nausea.  05/19/13  Yes Historical Albie Arizpe, MD  metoprolol (LOPRESSOR) 100 MG tablet Take 100 mg by mouth daily.    Yes Historical Merelyn Klump, MD  metoprolol (LOPRESSOR) 50 MG tablet Take 50 mg by mouth at bedtime.   Yes Historical Cross Jorge, MD  multivitamin-lutein (OCUVITE-LUTEIN) CAPS Take 1  capsule by mouth 2 (two) times daily.     Yes Historical Genavive Kubicki, MD  omeprazole (PRILOSEC) 20 MG capsule Take 20 mg by mouth daily.     Yes Historical Asherah Lavoy, MD  Tamsulosin HCl (FLOMAX) 0.4 MG CAPS Take 0.4 mg by mouth at bedtime.    Yes Historical Fidencio Duddy, MD  warfarin (COUMADIN) 1 MG tablet Take 1 mg by mouth daily. Takes along with the 5mg  tablet to equal 7mg  on Sunday and Thursday.  6mg  all other days of the week.   Yes Historical Maycol Hoying, MD  warfarin (COUMADIN) 5 MG tablet Take 5 mg by mouth every evening. Takes along with the 1mg  tablet to equal 7mg  on Sunday and Thursday.  6mg  all other days of the week.   Yes Historical Walter Grima, MD   BP 129/66  Pulse 66  Temp(Src) 98.6 F (37 C) (Oral)  Resp 20  SpO2 100% Physical Exam  Constitutional: He is oriented to person, place, and time. He appears well-developed and well-nourished. No distress.  HENT:  Head: Normocephalic and atraumatic.  Right Ear: Hearing normal.  Left Ear: Hearing normal.  Nose: Nose normal.  Mouth/Throat: Oropharynx is clear and moist and mucous membranes are normal.  Eyes: EOM are normal.  Neck: Normal range of motion. Neck supple.  Cardiovascular: Regular rhythm, S1 normal and S2 normal.  Exam reveals no gallop and no friction rub.   No murmur heard. Pulmonary/Chest: Effort normal and breath sounds normal. No respiratory distress. He exhibits no tenderness.  Abdominal: Soft. Normal appearance and bowel sounds are normal. There is no hepatosplenomegaly. There is no tenderness. There is no rebound, no guarding, no tenderness at McBurney's point and negative Murphy's sign. No hernia.  Musculoskeletal:       Right hip: He exhibits decreased range of motion and tenderness. He exhibits no deformity.       Cervical back: Normal. He exhibits normal range of motion.       Thoracic back: Normal.       Lumbar back: Normal.       Right upper leg: He exhibits tenderness and swelling.  Neurological: He is alert and  oriented to person, place, and time. He has normal strength. No cranial nerve deficit or sensory deficit. Coordination normal. GCS eye subscore is 4. GCS verbal subscore is 5. GCS motor subscore is 6.  Skin: Skin is warm, dry and intact. No rash noted. No cyanosis.  Psychiatric: He has a normal mood and affect. His speech is normal and behavior is normal. Thought content normal.    ED Course  Procedures (including critical care time) Labs Review Labs Reviewed  CBC - Abnormal; Notable for the following:  WBC 12.7 (*)    RBC 3.08 (*)    Hemoglobin 9.1 (*)    HCT 28.5 (*)    All other components within normal limits  BASIC METABOLIC PANEL - Abnormal; Notable for the following:    Glucose, Bld 151 (*)    BUN 24 (*)    Creatinine, Ser 1.50 (*)    Calcium 8.1 (*)    GFR calc non Af Amer 41 (*)    GFR calc Af Amer 47 (*)    All other components within normal limits  PROTIME-INR - Abnormal; Notable for the following:    Prothrombin Time 30.2 (*)    INR 2.88 (*)    All other components within normal limits    Imaging Review Dg Chest 1 View  05/06/2014   CLINICAL DATA:  Fall, right hip pain  EXAM: CHEST - 1 VIEW  COMPARISON:  10/17/2013  FINDINGS: Cardiomediastinal silhouette is stable. Status post CABG. No acute infiltrate or pleural effusion. No pulmonary edema.  IMPRESSION: No active disease.  Status post CABG.   Electronically Signed   By: Lahoma Crocker M.D.   On: 05/06/2014 13:35   Dg Pelvis 1-2 Views  05/06/2014   CLINICAL DATA:  Fall  EXAM: PELVIS - 1-2 VIEW  COMPARISON:  None.  FINDINGS: There is a displaced fracture of the lesser trochanter. There is also lucency in the intertrochanteric region medially. There is slight varus deformity of the proximal femur. Impaction intertrochanteric fracture is not excluded. Osteopenia.  IMPRESSION: Avulsed fracture of the lesser trochanter.  Possible intertrochanteric femur fracture.  CT is recommended.   Electronically Signed   By: Maryclare Mccullum M.D.    On: 05/06/2014 13:37   Dg Femur Right  05/06/2014   CLINICAL DATA:  Fall  EXAM: RIGHT FEMUR - 2 VIEW  COMPARISON:  None.  FINDINGS: The lesser trochanter is avulsed and displaced superiorly. There are overlapping structures at the intertrochanteric region. There is also slight varus deformity of the proximal femur. Underlying intertrochanteric fracture is not excluded.  IMPRESSION: Avulsed fracture of the right lesser trochanter. There may be an intertrochanteric fracture. Attention on pelvic radiograph is recommended.   Electronically Signed   By: Maryclare Boileau M.D.   On: 05/06/2014 13:35   Ct Head Wo Contrast  05/06/2014   CLINICAL DATA:  Fall  EXAM: CT HEAD WITHOUT CONTRAST  TECHNIQUE: Contiguous axial images were obtained from the base of the skull through the vertex without intravenous contrast.  COMPARISON:  None.  FINDINGS: Global atrophy. Chronic ischemic changes in the periventricular white matter. Cranium is intact. Mastoid air cells clear. Visualized paranasal sinuses are clear.  IMPRESSION: No acute intracranial pathology.   Electronically Signed   By: Maryclare Doddridge M.D.   On: 05/06/2014 13:04   Ct Hip Right Wo Contrast  05/06/2014   CLINICAL DATA:  RIGHT hip pain. Fracture identified on prior radiographs.  EXAM: CT OF THE RIGHT HIP WITHOUT CONTRAST  TECHNIQUE: Multidetector CT imaging was performed according to the standard protocol. Multiplanar CT image reconstructions were also generated.  COMPARISON:  Radiographs 05/06/2014. CT 05/01/2013 at [facility].  FINDINGS: There is an avulsion of the RIGHT lesser trochanter with anterior displacement of 4.3 cm. Proximal retraction is mild, measuring about 2 cm. On the coronal images, there is sclerosis surrounding the lesser trochanter, suggesting an underlying lesion, likely metastatic disease. No intertrochanteric extension of fracture is identified. Moderate RIGHT hip osteoarthritis is present with subchondral cyst in the femoral head and  acetabular roof. Fatty atrophy of the gluteus minimus muscle is present. Small nonspecific sclerotic lesion present in the RIGHT iliac bone. Ossification of the L5-S1 disc with ankylosis. Benign bone island in the RIGHT iliac crest. Aortoiliac atherosclerosis.  IMPRESSION: Displaced avulsion fracture of the RIGHT lesser trochanter. Crescentic sclerosis around the location of the avulsed fragment suggests an underlying osseous lesion. This crescentic sclerosis is new compared to the prior CT and is compatible with an underlying osseous metastatic lesion. Followup whole-body bone scan recommended to assess for other foci of metastatic disease. In this older man, PSA should also be obtained.   Electronically Signed   By: Dereck Ligas M.D.   On: 05/06/2014 15:39     EKG Interpretation None      MDM   Final diagnoses:  None   lesser trochanteric fracture right hip  Possible metastatic disease  Patient presents to the ER for evaluation of right hip pain after a fall which occurred last night. Patient has been able to bear some weight on the hip, but has significant pain. He does have some swelling in the area of the thigh. X-Elihu of the femur and hip showed evidence of lesser trochanter fracture. CT scan does not show any intertrochanteric hip fracture, irregularity seen on plain film is concerning for tumor.  Results were discussed with the family and patient. Patient is unsteady on his feet, lives at home and is a significant fall risk. He is on Coumadin for mechanical heart valve, this makes allowing him to go home even more dangerous. We'll asked for hospital admission for further management of his hip injury and workup of possible metastatic disease.  Addendum: Discussed with Doctor Ninfa Linden, orthopedics. He has reviewed the x-[place] and CAT scan. He does not feel that there is any surgery required for this at this time. He has recommended nonweightbearing status of the hip, will  follow.  Orpah Greek, MD 05/06/14 [place], MD 05/06/14 [id number]

## 2014-05-06 NOTE — Progress Notes (Signed)
ANTICOAGULATION CONSULT NOTE - Initial Consult  Pharmacy Consult for Warfarin Indication: Mechanical Valve  Allergies  Allergen Reactions  . Tetanus Toxoids Hives    Patient Measurements: Weight:  83.9 as of 04/26/2014 Height:  68 inches as of 04/26/2014  Vital Signs: Temp: 97.8 F (36.6 C) (06/28 1756) Temp src: Oral (06/28 1140) BP: 122/66 mmHg (06/28 1756) Pulse Rate: 71 (06/28 1756)  Labs:  Recent Labs  05/06/14 1216  HGB 9.1*  HCT 28.5*  PLT 205  LABPROT 30.2*  INR 2.88*  CREATININE 1.50*   The CrCl is unknown because both a height and weight (above a minimum accepted value) are required for this calculation.  Medical History: Past Medical History  Diagnosis Date  . CAD (coronary artery disease)     s/p CABG 1999  . HLD (hyperlipidemia)   . HTN (hypertension)   . GERD (gastroesophageal reflux disease)   . H/O hiatal hernia   . Arthritis   . Peripheral vascular disease     aneursym  . Aortic aneurysm     at least 3 cmm infrarenal AAA by lumbar CT 05/22/13 [facility]); 41 mm proximal ascending aorta on echo 05/05/13 ([place])   Medications:  HOME Warfarin regimen  Medication Sig Dispense Refill  . warfarin (COUMADIN) 1 MG tablet Take 1 mg by mouth daily. Takes along with the 5mg  tablet to equal 7mg  on Sunday and Thursday.  6mg  all other days of the week.      . warfarin (COUMADIN) 5 MG tablet Take 5 mg by mouth every evening. Takes along with the 1mg  tablet to equal 7mg  on Sunday and Thursday.  6mg  all other days of the week.       Assessment: 80yo male with AVR on chronic warfarin therapy prior to admit.  He has a therapeutic INR at 2.88 with a goal of 2.5-3.5.  He sustained a fall last evening and now has a femur fx.  We have been asked to manage his anticoagulation while he is hospitalized.  His CBC is stable with some drop in H/H but no overt bleeding.  Goal of Therapy:  INR 2.5-3.5 Monitor platelets by anticoagulation protocol: Yes    Plan:  1.  Will continue home regimen as noted above - Warfarin 6mg  daily except 7 mg on Sunday and Thursday 2.  F/U daily PT/INR 3.  Monitor for bleeding complications  Rober Minion, PharmD., MS Clinical Pharmacist Pager:  [phone] Thank you for allowing pharmacy to be part of this patients care team. 05/06/2014,6:20 PM

## 2014-05-06 NOTE — ED Notes (Signed)
Pt states he went to the bathroom alone and without his cane. States he "got tangled up in his feet" and landed on his right side.

## 2014-05-06 NOTE — ED Notes (Signed)
Pt brought back to room from triage via wheelchair with family in tow by [place], EMT; Jonette Pesa, EMT with assisting pt from wheelchair to stretcher; pt completely undressed, in gown, on monitor, continuous pulse oximetry and blood pressure cuff; Pollina, MD accessed pt and Crystal, RN at bedside; family in room

## 2014-05-06 NOTE — Progress Notes (Signed)
Patient ID: Clayton [name], male   DOB: Sep 19, 1926, 80 y.o.   MRN: [id number] I have reviewed Mr. Franek CT scan involving his right hip.  He has an avulsion fracture of the lessor trochanter with underlying sclerotic changes.  Thus far, could do well without surgery.  For now, should only be allowed touch-down weight bearing on the right leg with PT/OT working on mobility.  A MRI would be useful of the right hip to further evaluate the questionable pathologic component.

## 2014-05-06 NOTE — ED Notes (Signed)
The patient's wife said the patient went in to the bathroom and shortly after he yelled for her.  The wife said she found him on the floor.  The incident happened last night and his pain is 5/10.  The patient's wife said she iced it and it is not helping.  The patient is blind.

## 2014-05-07 ENCOUNTER — Inpatient Hospital Stay (HOSPITAL_COMMUNITY): Payer: Medicare HMO

## 2014-05-07 DIAGNOSIS — N179 Acute kidney failure, unspecified: Secondary | ICD-10-CM

## 2014-05-07 DIAGNOSIS — IMO0002 Reserved for concepts with insufficient information to code with codable children: Secondary | ICD-10-CM

## 2014-05-07 DIAGNOSIS — I10 Essential (primary) hypertension: Secondary | ICD-10-CM

## 2014-05-07 DIAGNOSIS — N189 Chronic kidney disease, unspecified: Secondary | ICD-10-CM

## 2014-05-07 DIAGNOSIS — E538 Deficiency of other specified B group vitamins: Secondary | ICD-10-CM

## 2014-05-07 LAB — BASIC METABOLIC PANEL
BUN: 32 mg/dL — ABNORMAL HIGH (ref 6–23)
CHLORIDE: 105 meq/L (ref 96–112)
CO2: 27 meq/L (ref 19–32)
Calcium: 8.1 mg/dL — ABNORMAL LOW (ref 8.4–10.5)
Creatinine, Ser: 1.9 mg/dL — ABNORMAL HIGH (ref 0.50–1.35)
GFR calc Af Amer: 35 mL/min — ABNORMAL LOW (ref >90)
GFR calc non Af Amer: 31 mL/min — ABNORMAL LOW (ref >90)
GLUCOSE: 124 mg/dL — AB (ref 70–99)
POTASSIUM: 4.4 meq/L (ref 3.7–5.3)
Sodium: 142 mEq/L (ref 137–147)

## 2014-05-07 LAB — CBC
HEMATOCRIT: 26.9 % — AB (ref 39.0–52.0)
HEMOGLOBIN: 8.5 g/dL — AB (ref 13.0–17.0)
MCH: 29.9 pg (ref 26.0–34.0)
MCHC: 31.6 g/dL (ref 30.0–36.0)
MCV: 94.7 fL (ref 78.0–100.0)
Platelets: 175 10*3/uL (ref 150–400)
RBC: 2.84 MIL/uL — ABNORMAL LOW (ref 4.22–5.81)
RDW: 14.5 % (ref 11.5–15.5)
WBC: 11.7 10*3/uL — ABNORMAL HIGH (ref 4.0–10.5)

## 2014-05-07 LAB — IRON AND TIBC
Iron: 18 ug/dL — ABNORMAL LOW (ref 42–135)
Saturation Ratios: 8 % — ABNORMAL LOW (ref 20–55)
TIBC: 219 ug/dL (ref 215–435)
UIBC: 201 ug/dL (ref 125–400)

## 2014-05-07 LAB — HEPATIC FUNCTION PANEL
ALBUMIN: 2.7 g/dL — AB (ref 3.5–5.2)
ALT: 10 U/L (ref 0–53)
AST: 15 U/L (ref 0–37)
Alkaline Phosphatase: 145 U/L — ABNORMAL HIGH (ref 39–117)
Bilirubin, Direct: <0.2 mg/dL (ref 0.0–0.3)
TOTAL PROTEIN: 5.8 g/dL — AB (ref 6.0–8.3)
Total Bilirubin: 0.6 mg/dL (ref 0.3–1.2)

## 2014-05-07 LAB — PROTIME-INR
INR: 3.07 — ABNORMAL HIGH (ref 0.00–1.49)
Prothrombin Time: 31.7 seconds — ABNORMAL HIGH (ref 11.6–15.2)

## 2014-05-07 LAB — FOLATE RBC: RBC FOLATE: 816 ng/mL — AB (ref >280)

## 2014-05-07 LAB — FERRITIN: Ferritin: 103 ng/mL (ref 22–322)

## 2014-05-07 LAB — VITAMIN B12: Vitamin B-12: 872 pg/mL (ref 211–911)

## 2014-05-07 MED ORDER — TECHNETIUM TC 99M MEDRONATE IV KIT
25.0000 | PACK | Freq: Once | INTRAVENOUS | Status: AC | PRN
  Administered 2014-05-07: 25 via INTRAVENOUS

## 2014-05-07 MED ORDER — SODIUM CHLORIDE 0.9 % IV SOLN
INTRAVENOUS | Status: DC
  Administered 2014-05-07 – 2014-05-10 (×2): via INTRAVENOUS

## 2014-05-07 MED ORDER — METOPROLOL TARTRATE 25 MG PO TABS
25.0000 mg | ORAL_TABLET | Freq: Two times a day (BID) | ORAL | Status: DC
  Administered 2014-05-07 – 2014-05-14 (×13): 25 mg via ORAL
  Filled 2014-05-07 (×18): qty 1

## 2014-05-07 NOTE — Evaluation (Signed)
Physical Therapy Evaluation Patient Details Name: Clayton [name] MRN: [id number] DOB: 1926/04/27 Today's Date: 05/07/2014   History of Present Illness  Patient fell last night while walking to bathroom nto using his cane/walker with resultant lesser trochanter avulsion fracture. PMHx of HTN, HPL, CAD s/p CABG, s/p AVR, legally blind   Clinical Impression  Pt very debilitated by injury and requires 2 person A for all aspects of mobility.  Pt with difficulty maintaining TDWBing and with his L UE disability has difficulty using UEs to A with mobility.  Pt had been at home with his wife, however at this time will need SNF at D/C to decrease burden of care prior to returning to home.      Follow Up Recommendations SNF    Equipment Recommendations  None recommended by PT    Recommendations for Other Services       Precautions / Restrictions Precautions Precautions: Fall Restrictions Weight Bearing Restrictions: Yes RLE Weight Bearing: Touchdown weight bearing Other Position/Activity Restrictions: pt is legally blind.        Mobility  Bed Mobility Overal bed mobility: Needs Assistance;+2 for physical assistance Bed Mobility: Supine to Sit     Supine to sit: Max assist;+2 for physical assistance Sit to supine: Total assist;+2 for physical assistance   General bed mobility comments: pt attempts to A with coming to sit, however requires extensive 2 person A.    Transfers Overall transfer level: Needs assistance Equipment used: 2 person hand held assist Transfers: Sit to/from [facility] Sit to Stand: Max assist;+2 physical assistance Stand pivot transfers: Max assist;+2 physical assistance       General transfer comment: cues for safe technique and encouragement.  pt with difficulty maintaining TDWBing on R LE.    Ambulation/Gait                Stairs            Wheelchair Mobility    Modified Rankin (Stroke Patients Only)       Balance  Overall balance assessment: Needs assistance Sitting-balance support: Bilateral upper extremity supported;Feet supported Sitting balance-Leahy Scale: Poor     Standing balance support: No upper extremity supported;During functional activity Standing balance-Leahy Scale: Zero                               Pertinent Vitals/Pain R hip 7/10 during mobility.  RN called for meds during session.      Home Living Family/patient expects to be discharged to:: Skilled nursing facility                      Prior Function Level of Independence: Needs assistance   Gait / Transfers Assistance Needed: pt occasionally used a cane for mobility, but wife states he didn't always use it.    ADL's / Homemaking Assistance Needed: Wife performs most homemaking tasks.  Pt was able to perform ADLs         Hand Dominance   Dominant Hand: Right    Extremity/Trunk Assessment   Upper Extremity Assessment: LUE deficits/detail       LUE Deficits / Details: Broke his left arm above his elbow when he was 80 years old and has had multiple surgeries on it since that time (pt has a "short" arm elbow distally with decreased use of hand as well. Decreased elbow extension as well   Lower Extremity Assessment: Generalized weakness;RLE deficits/detail RLE  Deficits / Details: Painful with all mobility.  pt generally weak in Bil LEs.      Cervical / Trunk Assessment: Kyphotic  Communication   Communication: HOH  Cognition Arousal/Alertness: Awake/alert Behavior During Therapy: WFL for tasks assessed/performed Overall Cognitive Status: Within Functional Limits for tasks assessed                      General Comments      Exercises        Assessment/Plan    PT Assessment Patient needs continued PT services  PT Diagnosis Difficulty walking;Acute pain;Generalized weakness   PT Problem List Decreased strength;Decreased activity tolerance;Decreased balance;Decreased  mobility;Decreased knowledge of use of DME;Pain  PT Treatment Interventions DME instruction;Gait training;Stair training;Functional mobility training;Therapeutic activities;Therapeutic exercise;Balance training;Patient/family education   PT Goals (Current goals can be found in the Care Plan section) Acute Rehab PT Goals Patient Stated Goal: None stated.   PT Goal Formulation: With patient Time For Goal Achievement: 05/21/14 Potential to Achieve Goals: Good    Frequency Min 3X/week   Barriers to discharge        Co-evaluation               End of Session Equipment Utilized During Treatment: Gait belt Activity Tolerance: Patient limited by fatigue;Patient limited by pain Patient left: in chair;with call bell/phone within reach Nurse Communication: Mobility status         Time: 7681-1572 PT Time Calculation (min): 25 min   Charges:   PT Evaluation $Initial PT Evaluation Tier I: 1 Procedure PT Treatments $Therapeutic Activity: 8-22 mins   PT G CodesCatarina Hartshorn, [place] [phone] 05/07/2014, 2:00 PM

## 2014-05-07 NOTE — Evaluation (Signed)
Occupational Therapy Evaluation Patient Details Name: Clayton [name] MRN: [id number] DOB: 1926-10-27 Today's Date: 05/07/2014    History of Present Illness Patient fell last night while walking to bathroom nto using his cane/walker with resultant lesser trochanter avulsion fracture. PMHx of HTN, HPL, CAD s/p CABG, s/p AVR, legally blind    Clinical Impression   This 80 yo male admitted for above presents to acute OT with decreased mobility, increased pain , decreased use of RLE, decreased use of LUE (pre-morbid), legally blind, TDWB RLE all affecting pt's ability to care for himself and for his wife to A him sufficiently. He will benefit from continued OT at SNF to increased independence and decrease burden of care.    Follow Up Recommendations  SNF    Equipment Recommendations   (TBD at next venue)       Precautions / Restrictions Precautions Precautions: Fall Restrictions Weight Bearing Restrictions: Yes RLE Weight Bearing: Touchdown weight bearing      Mobility Bed Mobility Overal bed mobility: Needs Assistance;+2 for physical assistance Bed Mobility: Sit to Supine       Sit to supine: Total assist;+2 for physical assistance      Transfers Overall transfer level: Needs assistance   Transfers: Sit to/from Stand;Stand Pivot Transfers Sit to Stand: Max assist;+2 physical assistance Stand pivot transfers: Max assist;+2 physical assistance            Balance Overall balance assessment: Needs assistance Sitting-balance support: Bilateral upper extremity supported;Feet supported Sitting balance-Leahy Scale: Poor     Standing balance support: No upper extremity supported Standing balance-Leahy Scale: Zero                              ADL Overall ADL's : Needs assistance/impaired Eating/Feeding: Set up;Supervision/ safety (due to legally blind)   Grooming: Sitting;Minimal assistance   Upper Body Bathing: Minimal assitance;Sitting   Lower Body  Bathing: Total assistance;Sit to/from stand   Upper Body Dressing : Moderate assistance;Sitting   Lower Body Dressing: Total assistance;Sit to/from stand   Toilet Transfer: Total assistance;+2 for physical assistance;Stand-pivot (recliner>bed going to pt's left)   Toileting- Clothing Manipulation and Hygiene: Total assistance;Sit to/from stand               Vision  legally blind                          Pertinent Vitals/Pain FACES 6/10 RLE; repositioned and pre-medicated     Hand Dominance Right   Extremity/Trunk Assessment Upper Extremity Assessment Upper Extremity Assessment: LUE deficits/detail LUE Deficits / Details: Broke his left arm above his elbow when he was 80 years old and has had multiple surgeries on it since that time (pt has a "short" arm elbow distally with decreased use of hand as well. Decreased elbow extension as well LUE Coordination: decreased fine motor;decreased gross motor           Communication Communication Communication: HOH   Cognition Arousal/Alertness: Awake/alert Behavior During Therapy: WFL for tasks assessed/performed Overall Cognitive Status: Within Functional Limits for tasks assessed                                Home Living Family/patient expects to be discharged to:: Skilled nursing facility  Prior Functioning/Environment Level of Independence: Needs assistance  Gait / Transfers Assistance Needed: pt occasionally used a cane for mobility, but wife states he didn't always use it.   ADL's / Homemaking Assistance Needed: Wife performs most homemaking tasks.  Pt was able to perform ADLs         OT Diagnosis: Generalized weakness;Blindness and low vision;Acute pain   OT Problem List: Decreased strength;Decreased range of motion;Decreased activity tolerance;Impaired balance (sitting and/or standing);Pain;Impaired vision/perception;Decreased  coordination;Impaired UE functional use;Decreased knowledge of use of DME or AE   OT Treatment/Interventions: Self-care/ADL training;Patient/family education;Balance training;Therapeutic activities;DME and/or AE instruction    OT Goals(Current goals can be found in the care plan section) Acute Rehab OT Goals Patient Stated Goal: wife---"to go to rehab hopefully short term" OT Goal Formulation: With patient/family Time For Goal Achievement: 05/21/14 Potential to Achieve Goals: Good  OT Frequency: Min 2X/week              End of Session Equipment Utilized During Treatment: Gait belt  Activity Tolerance: Patient limited by fatigue;Patient limited by pain Patient left: in bed;with call bell/phone within reach;with family/visitor present   Time: 1020-1045 OT Time Calculation (min): 25 min Charges:  OT General Charges $OT Visit: 1 Procedure OT Evaluation $Initial OT Evaluation Tier I: 1 Procedure OT Treatments $Self Care/Home Management : 8-22 mins  Almon Register [phone] 05/07/2014, 11:36 AM

## 2014-05-07 NOTE — Clinical Social Work Placement (Addendum)
Clinical Social Work Department CLINICAL SOCIAL WORK PLACEMENT NOTE 05/07/2014  Patient:  Clayton [name], Clayton [name]  Account Number:  [id number] Admit date:  05/06/2014  Clinical Social Worker:  Daiva Huge  Date/time:  05/07/2014 11:57 AM  Clinical Social Work is seeking post-discharge placement for this patient at the following level of care:   SKILLED NURSING   (*CSW will update this form in Epic as items are completed)   05/07/2014  Patient/family provided with [place] Department of Clinical Social Work's list of facilities offering this level of care within the geographic area requested by the patient (or if unable, by the patient's family).  05/07/2014  Patient/family informed of their freedom to choose among providers that offer the needed level of care, that participate in Medicare, Medicaid or managed care program needed by the patient, have an available bed and are willing to accept the patient.  05/07/2014  Patient/family informed of [facility]' ownership interest in [facility], as well as of the fact that they are under no obligation to receive care at this facility.  PASARR submitted to EDS on 05/07/2014 PASARR number received on 05/07/2014  FL2 transmitted to all facilities in geographic area requested by pt/family on  05/07/2014 FL2 transmitted to all facilities within larger geographic area on   Patient informed that his/her managed care company has contracts with or will negotiate with  certain facilities, including the following:     Patient/family informed of bed offers received:  05/14/2014 Patient chooses bed at [facility] recommends and patient chooses bed at    Patient to be transferred to  on  [facility] Patient to be transferred to facility by EMS Patient and family notified of transfer on 05/14/2014 Name of family member notified:  Patient's wife  The following physician request were entered in  Epic:   Additional Comments: Eduard Clos, MSW, [place]

## 2014-05-07 NOTE — Consult Note (Signed)
Reason for Consult:  Right hip lesser trochanteric avulsion fracture Referring Physician: EDP  Clayton [name] is an 80 [name].o. male.  HPI:   80 yo male who sustained a mechanical fall at home Saturday evening.  With continued right hip pain and difficulty ambulating, he came to the ED for further eval.  He was found to have an avulsion fracture of his lesser trochanter on the right with a questionable pathologic component.  Past Medical History  Diagnosis Date  . CAD (coronary artery disease)     s/p CABG 1999  . HLD (hyperlipidemia)   . HTN (hypertension)   . GERD (gastroesophageal reflux disease)   . H/O hiatal hernia   . Arthritis   . Peripheral vascular disease     aneursym  . Aortic aneurysm     at least 3 cmm infrarenal AAA by lumbar CT 05/22/13 [facility]); 41 mm proximal ascending aorta on echo 05/05/13 [facility])    Past Surgical History  Procedure Laterality Date  . Aortic valve replacement  1998  . Coronary artery bypass graft  1998  . Cholecystectomy    . Tonsillectomy    . US extremity*l* Left     arm  . Inguinal hernia repair  09/25/2013    with mesh  . Inguinal hernia repair Right 09/25/2013    Procedure: HERNIA REPAIR INGUINAL ADULT;  Surgeon: Imogene Burn. Georgette Dover, MD;  Location: [place];  Service: General;  Laterality: Right;  . Insertion of mesh Right 09/25/2013    Procedure: INSERTION OF MESH;  Surgeon: Imogene Burn. Georgette Dover, MD;  Location: [facility];  Service: General;  Laterality: Right;  . Hand surgery Left     MULTIPLE     Family History  Problem Relation Age of Onset  . Coronary artery disease      Social History:  reports that he has quit smoking. He has never used smokeless tobacco. He reports that he does not drink alcohol or use illicit drugs.  Allergies:  Allergies  Allergen Reactions  . Tetanus Toxoids Hives    Medications: I have reviewed the patient's current medications.  Results for orders placed during the hospital encounter of 05/06/14  (from the past 48 hour(s))  CBC     Status: Abnormal   Collection Time    05/06/14 12:16 PM      Result Value Ref Range   WBC 12.7 (*) 4.0 - 10.5 K/uL   RBC 3.08 (*) 4.22 - 5.81 MIL/uL   Hemoglobin 9.1 (*) 13.0 - 17.0 g/dL   HCT 28.5 (*) 39.0 - 52.0 %   MCV 92.5  78.0 - 100.0 fL   MCH 29.5  26.0 - 34.0 pg   MCHC 31.9  30.0 - 36.0 g/dL   RDW 14.4  11.5 - 15.5 %   Platelets 205  150 - 400 K/uL  BASIC METABOLIC PANEL     Status: Abnormal   Collection Time    05/06/14 12:16 PM      Result Value Ref Range   Sodium 141  137 - 147 mEq/L   Potassium 4.2  3.7 - 5.3 mEq/L   Chloride 104  96 - 112 mEq/L   CO2 25  19 - 32 mEq/L   Glucose, Bld 151 (*) 70 - 99 mg/dL   BUN 24 (*) 6 - 23 mg/dL   Creatinine, Ser 1.50 (*) 0.50 - 1.35 mg/dL   Calcium 8.1 (*) 8.4 - 10.5 mg/dL   GFR calc non Af Amer 41 (*) >  90 mL/min   GFR calc Af Amer 47 (*) >90 mL/min   Comment: (NOTE)     The eGFR has been calculated using the CKD EPI equation.     This calculation has not been validated in all clinical situations.     eGFR's persistently <90 mL/min signify possible Chronic Kidney     Disease.  PROTIME-INR     Status: Abnormal   Collection Time    05/06/14 12:16 PM      Result Value Ref Range   Prothrombin Time 30.2 (*) 11.6 - 15.2 seconds   INR 2.88 (*) 0.00 - 1.49  CBC     Status: Abnormal   Collection Time    05/07/14  4:48 AM      Result Value Ref Range   WBC 11.7 (*) 4.0 - 10.5 K/uL   RBC 2.84 (*) 4.22 - 5.81 MIL/uL   Hemoglobin 8.5 (*) 13.0 - 17.0 g/dL   HCT 26.9 (*) 39.0 - 52.0 %   MCV 94.7  78.0 - 100.0 fL   MCH 29.9  26.0 - 34.0 pg   MCHC 31.6  30.0 - 36.0 g/dL   RDW 14.5  11.5 - 15.5 %   Platelets 175  150 - 400 K/uL  BASIC METABOLIC PANEL     Status: Abnormal   Collection Time    05/07/14  4:48 AM      Result Value Ref Range   Sodium 142  137 - 147 mEq/L   Potassium 4.4  3.7 - 5.3 mEq/L   Chloride 105  96 - 112 mEq/L   CO2 27  19 - 32 mEq/L   Glucose, Bld 124 (*) 70 - 99 mg/dL    BUN 32 (*) 6 - 23 mg/dL   Creatinine, Ser 1.90 (*) 0.50 - 1.35 mg/dL   Calcium 8.1 (*) 8.4 - 10.5 mg/dL   GFR calc non Af Amer 31 (*) >90 mL/min   GFR calc Af Amer 35 (*) >90 mL/min   Comment: (NOTE)     The eGFR has been calculated using the CKD EPI equation.     This calculation has not been validated in all clinical situations.     eGFR's persistently <90 mL/min signify possible Chronic Kidney     Disease.  PROTIME-INR     Status: Abnormal   Collection Time    05/07/14  4:48 AM      Result Value Ref Range   Prothrombin Time 31.7 (*) 11.6 - 15.2 seconds   INR 3.07 (*) 0.00 - 1.49    Dg Chest 1 View  05/06/2014   CLINICAL DATA:  Fall, right hip pain  EXAM: CHEST - 1 VIEW  COMPARISON:  10/17/2013  FINDINGS: Cardiomediastinal silhouette is stable. Status post CABG. No acute infiltrate or pleural effusion. No pulmonary edema.  IMPRESSION: No active disease.  Status post CABG.   Electronically Signed   By: Lahoma Crocker M.D.   On: 05/06/2014 13:35   Dg Pelvis 1-2 Views  05/06/2014   CLINICAL DATA:  Fall  EXAM: PELVIS - 1-2 VIEW  COMPARISON:  None.  FINDINGS: There is a displaced fracture of the lesser trochanter. There is also lucency in the intertrochanteric region medially. There is slight varus deformity of the proximal femur. Impaction intertrochanteric fracture is not excluded. Osteopenia.  IMPRESSION: Avulsed fracture of the lesser trochanter.  Possible intertrochanteric femur fracture.  CT is recommended.   Electronically Signed   By: Maryclare Wands M.D.   On: 05/06/2014 13:37  Dg Femur Right  05/06/2014   CLINICAL DATA:  Fall  EXAM: RIGHT FEMUR - 2 VIEW  COMPARISON:  None.  FINDINGS: The lesser trochanter is avulsed and displaced superiorly. There are overlapping structures at the intertrochanteric region. There is also slight varus deformity of the proximal femur. Underlying intertrochanteric fracture is not excluded.  IMPRESSION: Avulsed fracture of the right lesser trochanter. There may be  an intertrochanteric fracture. Attention on pelvic radiograph is recommended.   Electronically Signed   By: Maryclare Heavrin M.D.   On: 05/06/2014 13:35   Ct Head Wo Contrast  05/06/2014   CLINICAL DATA:  Fall  EXAM: CT HEAD WITHOUT CONTRAST  TECHNIQUE: Contiguous axial images were obtained from the base of the skull through the vertex without intravenous contrast.  COMPARISON:  None.  FINDINGS: Global atrophy. Chronic ischemic changes in the periventricular white matter. Cranium is intact. Mastoid air cells clear. Visualized paranasal sinuses are clear.  IMPRESSION: No acute intracranial pathology.   Electronically Signed   By: Maryclare Hamberger M.D.   On: 05/06/2014 13:04   Ct Hip Right Wo Contrast  05/06/2014   CLINICAL DATA:  RIGHT hip pain. Fracture identified on prior radiographs.  EXAM: CT OF THE RIGHT HIP WITHOUT CONTRAST  TECHNIQUE: Multidetector CT imaging was performed according to the standard protocol. Multiplanar CT image reconstructions were also generated.  COMPARISON:  Radiographs 05/06/2014. CT 05/01/2013 at [facility].  FINDINGS: There is an avulsion of the RIGHT lesser trochanter with anterior displacement of 4.3 cm. Proximal retraction is mild, measuring about 2 cm. On the coronal images, there is sclerosis surrounding the lesser trochanter, suggesting an underlying lesion, likely metastatic disease. No intertrochanteric extension of fracture is identified. Moderate RIGHT hip osteoarthritis is present with subchondral cyst in the femoral head and acetabular roof. Fatty atrophy of the gluteus minimus muscle is present. Small nonspecific sclerotic lesion present in the RIGHT iliac bone. Ossification of the L5-S1 disc with ankylosis. Benign bone island in the RIGHT iliac crest. Aortoiliac atherosclerosis.  IMPRESSION: Displaced avulsion fracture of the RIGHT lesser trochanter. Crescentic sclerosis around the location of the avulsed fragment suggests an underlying osseous lesion. This crescentic  sclerosis is new compared to the prior CT and is compatible with an underlying osseous metastatic lesion. Followup whole-body bone scan recommended to assess for other foci of metastatic disease. In this older man, PSA should also be obtained.   Electronically Signed   By: Dereck Ligas M.D.   On: 05/06/2014 15:39    ROS Blood pressure 120/49, pulse 75, temperature 98.2 F (36.8 C), temperature source Oral, resp. rate 20, SpO2 95.00%. Physical Exam  Musculoskeletal:       Right hip: He exhibits decreased range of motion, decreased strength and bony tenderness.  He has pain involving the right hip with attempts at passive motion  Assessment/Plan: Less trochanteric avulsion fracture of the right hip with questionable pathologic component. 1)  For now, I recommend touch-down weight bearing on his right hip and trying to get him up with therapy.  He needs a MRI of his right hip to assess whether or not any fracture lines extend into the greater trochanteric region.  If this is the case, he would need surgery.  Will order the right hip MRI to further assess the fracture.  Clayton [name],Clayton [name] 05/07/2014, 7:26 AM

## 2014-05-07 NOTE — Progress Notes (Signed)
ANTICOAGULATION CONSULT NOTE - Follow Up Consult  Pharmacy Consult for Coumadin Indication: mchanical AVR  Allergies  Allergen Reactions  . Tetanus Toxoids Hives    Patient Measurements:    Vital Signs: Temp: 98.2 F (36.8 C) (06/29 6063) Temp src: Oral (06/29 0613) BP: 120/49 mmHg (06/29 0613) Pulse Rate: 75 (06/29 0613)  Labs:  Recent Labs  05/06/14 1216 05/07/14 0448  HGB 9.1* 8.5*  HCT 28.5* 26.9*  PLT 205 175  LABPROT 30.2* 31.7*  INR 2.88* 3.07*  CREATININE 1.50* 1.90*    The CrCl is unknown because both a height and weight (above a minimum accepted value) are required for this calculation.   Medications:  Scheduled:  . aspirin EC  81 mg Oral Daily  . cyanocobalamin  1,000 mcg Intramuscular Q30 days  . donepezil  5 mg Oral QHS  . dorzolamide-timolol  1 drop Both Eyes BID  . latanoprost  1 drop Both Eyes QHS  . metoprolol  25 mg Oral BID  . multivitamin-lutein  1 capsule Oral BID  . pantoprazole  40 mg Oral Daily  . tamsulosin  0.4 mg Oral QHS  . warfarin  7 mg Oral Once per day on Sun Thu   And  . warfarin  6 mg Oral Once per day on Mon Tue Wed Fri Sat  . Warfarin - Pharmacist Dosing Inpatient   Does not apply q1800    Assessment: 80 yo M on chronic Coumadin for a hx mechanical AVR.  INR is within goal range.  Hgb has dropped to 8.5 but no bleeding noted.  Pt is s/p fall and awaiting MRI and ortho recs regarding surgical repair of hip.  Until then, the plan is to continue Coumadin.  Goal of Therapy:  INR 2.5-3.5 Monitor platelets by anticoagulation protocol: Yes   Plan:  Home dose = 6mg  daily except 7mg  Sun/Thurs Daily INR  [employer], Pharm.D., BCPS Clinical Pharmacist Pager [phone] 05/07/2014 11:38 AM

## 2014-05-07 NOTE — Clinical Social Work Psychosocial (Signed)
Clinical Social Work Department BRIEF PSYCHOSOCIAL ASSESSMENT 05/07/2014  Patient:  Clayton [name], Clayton [name]     Account Number:  [id number]     Admit date:  05/06/2014  Clinical Social Worker:  Daiva Huge  Date/Time:  05/07/2014 11:29 AM  Referred by:  Physician  Date Referred:  05/07/2014 Referred for  SNF Placement   Other Referral:   Interview type:  Family Other interview type:   spoke with wife of 46 years in August in hall while patient was getting bathed-    PSYCHOSOCIAL DATA Living Status:  FAMILY Admitted from facility:   Level of care:   Primary support name:  wife, extended family- 3 adult children Primary support relationship to patient:  FAMILY Degree of support available:   good    CURRENT CONCERNS Current Concerns  Post-Acute Placement   Other Concerns:    SOCIAL WORK ASSESSMENT / PLAN Spoke with wife who reports she has been caring for her husband at home- we discussed possible need for SNF at d/c as he will likely require more assistance than she can provide (she is sitting in a hospital loaned wheelchair during our visit).  She is awaiting word on possible surgery- she also report that they may have found a mass in the hip area- she became somewhat tearful about this- she has 2 family members at her side who are supportive and here for her as well as her 3 children and 10 grandkids within 13 miles.   Assessment/plan status:  Other - See comment Other assessment/ plan:   FL2 and PASARR for SNF search   Information/referral to community resources:   SNF list    PATIENT'S/FAMILY'S RESPONSE TO PLAN OF CARE: Patients wife tells me he has been to [facility] in the past for SNF and she would be open to this- support provided- will proceed with SNF and advise     Eduard Clos, MSW, [place]

## 2014-05-07 NOTE — Progress Notes (Signed)
PROGRESS NOTE  Clayton [name] HFS:[id number] DOB: May 25, 1926 DOA: 05/06/2014 PCP: [name], MD  Brief history 80 y.o. male with PMH of HTN, HLD, CAD s/p CABG, s/p AVR, legally blind presented with mechanical fall and found to have trochanteric fracture R; Patient fell last night while walking to bathroom nto using his cane/walker.  No LOC.  CT of the right hip revealed new finding of crescentic sclerosis concerning for malignancy, therefore raising the concern of a pathologic fracture. MRI of the hip was obtained. Skeletal survey was negative. Orthopedics has been consulted and further intervention is pending MRI of the right hip.  Assessment/Plan: Less trochanteric avulsion fracture of the right hip -Concerned about possibility of pathologic fracture with new crescentic sclerosis noted on CT of the hip -Appreciate orthopedics consultation -Await MRI right hip to determine necessity of surgical intervention -PT evaluation for mobilization -No history of malignancy -Await PSA, SPEP Crescentic sclerosis -Associated with the avulsed fragment -Skeletal survey negative for metastatic or lytic lesions -MRI right hip pending -Nuclear medicine bone scan pending Status post aortic valve replacement -Continue warfarin -If the patient needs surgical intervention, he would need to be bridged with heparin drip Acute on chronic renal failure (CKD stage III) -Baseline creatinine 1.2-1.4 -Clinically appears hypovolemic -Discontinue lisinopril -judicious IVF Normocytic anemia -Patient has previous history of B12 deficiency and had previously received B12 injections -Await iron studies, RBC folate, serum B12 Coronary artery disease with hx CABG -Continue aspirin and metoprolol tartrate -Adjust metoprolol 25 mg twice a day   Family Communication:   Tried to call wife--left voicemail Disposition Plan:   Likely SNF     Procedures/Studies: Dg Chest 1 View  05/06/2014   CLINICAL DATA:   Fall, right hip pain  EXAM: CHEST - 1 VIEW  COMPARISON:  10/17/2013  FINDINGS: Cardiomediastinal silhouette is stable. Status post CABG. No acute infiltrate or pleural effusion. No pulmonary edema.  IMPRESSION: No active disease.  Status post CABG.   Electronically Signed   By: Lahoma Crocker M.D.   On: 05/06/2014 13:35   Dg Pelvis 1-2 Views  05/06/2014   CLINICAL DATA:  Fall  EXAM: PELVIS - 1-2 VIEW  COMPARISON:  None.  FINDINGS: There is a displaced fracture of the lesser trochanter. There is also lucency in the intertrochanteric region medially. There is slight varus deformity of the proximal femur. Impaction intertrochanteric fracture is not excluded. Osteopenia.  IMPRESSION: Avulsed fracture of the lesser trochanter.  Possible intertrochanteric femur fracture.  CT is recommended.   Electronically Signed   By: Maryclare Lorge M.D.   On: 05/06/2014 13:37   Dg Femur Right  05/06/2014   CLINICAL DATA:  Fall  EXAM: RIGHT FEMUR - 2 VIEW  COMPARISON:  None.  FINDINGS: The lesser trochanter is avulsed and displaced superiorly. There are overlapping structures at the intertrochanteric region. There is also slight varus deformity of the proximal femur. Underlying intertrochanteric fracture is not excluded.  IMPRESSION: Avulsed fracture of the right lesser trochanter. There may be an intertrochanteric fracture. Attention on pelvic radiograph is recommended.   Electronically Signed   By: Maryclare Wiersma M.D.   On: 05/06/2014 13:35   Ct Head Wo Contrast  05/06/2014   CLINICAL DATA:  Fall  EXAM: CT HEAD WITHOUT CONTRAST  TECHNIQUE: Contiguous axial images were obtained from the base of the skull through the vertex without intravenous contrast.  COMPARISON:  None.  FINDINGS: Global atrophy. Chronic ischemic changes in the periventricular  white matter. Cranium is intact. Mastoid air cells clear. Visualized paranasal sinuses are clear.  IMPRESSION: No acute intracranial pathology.   Electronically Signed   By: Maryclare Amster M.D.   On:  05/06/2014 13:04   Ct Hip Right Wo Contrast  05/06/2014   CLINICAL DATA:  RIGHT hip pain. Fracture identified on prior radiographs.  EXAM: CT OF THE RIGHT HIP WITHOUT CONTRAST  TECHNIQUE: Multidetector CT imaging was performed according to the standard protocol. Multiplanar CT image reconstructions were also generated.  COMPARISON:  Radiographs 05/06/2014. CT 05/01/2013 at [facility].  FINDINGS: There is an avulsion of the RIGHT lesser trochanter with anterior displacement of 4.3 cm. Proximal retraction is mild, measuring about 2 cm. On the coronal images, there is sclerosis surrounding the lesser trochanter, suggesting an underlying lesion, likely metastatic disease. No intertrochanteric extension of fracture is identified. Moderate RIGHT hip osteoarthritis is present with subchondral cyst in the femoral head and acetabular roof. Fatty atrophy of the gluteus minimus muscle is present. Small nonspecific sclerotic lesion present in the RIGHT iliac bone. Ossification of the L5-S1 disc with ankylosis. Benign bone island in the RIGHT iliac crest. Aortoiliac atherosclerosis.  IMPRESSION: Displaced avulsion fracture of the RIGHT lesser trochanter. Crescentic sclerosis around the location of the avulsed fragment suggests an underlying osseous lesion. This crescentic sclerosis is new compared to the prior CT and is compatible with an underlying osseous metastatic lesion. Followup whole-body bone scan recommended to assess for other foci of metastatic disease. In this older man, PSA should also be obtained.   Electronically Signed   By: Dereck Ligas M.D.   On: 05/06/2014 15:39   Dg Bone Survey Met  05/07/2014   CLINICAL DATA:  Metastatic bone survey.  EXAM: METASTATIC BONE SURVEY  COMPARISON:  CT right hip 05/06/2014  FINDINGS: No lytic or sclerotic bone lesions are identified in the axial or appendicular skeleton.  There is an avulsion fracture again demonstrated at the lesser trochanter region of the right  hip.  There is degenerative joint disease.  IMPRESSION: Negative metastatic bone survey for lytic or sclerotic metastatic disease.  Avulsion fracture at the lesser trochanter of the right hip.  Degenerative joint disease.   Electronically Signed   By: Kalman Jewels M.D.   On: 05/07/2014 07:33         Subjective: Patient complains of right hip pain. Denies fevers, chills, chest pain, shortness breath, nausea, vomiting, diarrhea, abdominal pain. No dysuria   Objective: Filed Vitals:   05/06/14 1715 05/06/14 1756 05/06/14 2107 05/07/14 0613  BP: 107/57 122/66 119/49 120/49  Pulse: 69 71 73 75  Temp:  97.8 F (36.6 C) 97.7 F (36.5 C) 98.2 F (36.8 C)  TempSrc:   Oral Oral  Resp:  22 20 20   SpO2: 97% 99% 98% 95%    Intake/Output Summary (Last 24 hours) at 05/07/14 0835 Last data filed at 05/07/14 0033  Gross per 24 hour  Intake    120 ml  Output    100 ml  Net     20 ml   Weight change:  Exam:   General:  Pt is alert, follows commands appropriately, not in acute distress  HEENT: No icterus, No thrush,  [place]/AT  Cardiovascular: RRR, S1/S2, no rubs, no gallops  Respiratory: CTA bilaterally, no wheezing, no crackles, no rhonchi  Abdomen: Soft/+BS, non tender, non distended, no guarding  Extremities: No edema, No lymphangitis, No petechiae, No rashes, no synovitis  Data Reviewed: Basic Metabolic Panel:  Recent Labs  Lab 05/06/14 1216 05/07/14 0448  NA 141 142  K 4.2 4.4  CL 104 105  CO2 25 27  GLUCOSE 151* 124*  BUN 24* 32*  CREATININE 1.50* 1.90*  CALCIUM 8.1* 8.1*   Liver Function Tests: No results found for this basename: AST, ALT, ALKPHOS, BILITOT, PROT, ALBUMIN,  in the last 168 hours No results found for this basename: LIPASE, AMYLASE,  in the last 168 hours No results found for this basename: AMMONIA,  in the last 168 hours CBC:  Recent Labs Lab 05/06/14 1216 05/07/14 0448  WBC 12.7* 11.7*  HGB 9.1* 8.5*  HCT 28.5* 26.9*  MCV 92.5 94.7  PLT  205 175   Cardiac Enzymes: No results found for this basename: CKTOTAL, CKMB, CKMBINDEX, TROPONINI,  in the last 168 hours BNP: No components found with this basename: POCBNP,  CBG: No results found for this basename: GLUCAP,  in the last 168 hours  No results found for this or any previous visit (from the past 240 hour(s)).   Scheduled Meds: . aspirin EC  81 mg Oral Daily  . cyanocobalamin  1,000 mcg Intramuscular Q30 days  . donepezil  5 mg Oral QHS  . dorzolamide-timolol  1 drop Both Eyes BID  . latanoprost  1 drop Both Eyes QHS  . lisinopril  10 mg Oral Daily  . metoprolol  50 mg Oral BID  . multivitamin-lutein  1 capsule Oral BID  . pantoprazole  40 mg Oral Daily  . tamsulosin  0.4 mg Oral QHS  . warfarin  7 mg Oral Once per day on Sun Thu   And  . warfarin  6 mg Oral Once per day on Mon Tue Wed Fri Sat  . Warfarin - Pharmacist Dosing Inpatient   Does not apply q1800   Continuous Infusions:    TAT, DAVID, DO  [facility] Pager [phone]  If 7PM-7AM, please contact night-coverage [email or web address] Password [facility] 05/07/2014, 8:35 AM   LOS: 1 day

## 2014-05-08 ENCOUNTER — Inpatient Hospital Stay (HOSPITAL_COMMUNITY): Payer: Medicare HMO

## 2014-05-08 DIAGNOSIS — Z7901 Long term (current) use of anticoagulants: Secondary | ICD-10-CM

## 2014-05-08 DIAGNOSIS — I359 Nonrheumatic aortic valve disorder, unspecified: Secondary | ICD-10-CM

## 2014-05-08 LAB — CBC
HCT: 24.1 % — ABNORMAL LOW (ref 39.0–52.0)
Hemoglobin: 7.8 g/dL — ABNORMAL LOW (ref 13.0–17.0)
MCH: 30.4 pg (ref 26.0–34.0)
MCHC: 32.4 g/dL (ref 30.0–36.0)
MCV: 93.8 fL (ref 78.0–100.0)
PLATELETS: 157 10*3/uL (ref 150–400)
RBC: 2.57 MIL/uL — ABNORMAL LOW (ref 4.22–5.81)
RDW: 14.2 % (ref 11.5–15.5)
WBC: 12.5 10*3/uL — AB (ref 4.0–10.5)

## 2014-05-08 LAB — PROTIME-INR
INR: 3.5 — ABNORMAL HIGH (ref 0.00–1.49)
PROTHROMBIN TIME: 35.1 s — AB (ref 11.6–15.2)

## 2014-05-08 LAB — COMPREHENSIVE METABOLIC PANEL
ALBUMIN: 2.5 g/dL — AB (ref 3.5–5.2)
ALK PHOS: 136 U/L — AB (ref 39–117)
ALT: 9 U/L (ref 0–53)
AST: 14 U/L (ref 0–37)
BILIRUBIN TOTAL: 0.8 mg/dL (ref 0.3–1.2)
BUN: 35 mg/dL — ABNORMAL HIGH (ref 6–23)
CHLORIDE: 106 meq/L (ref 96–112)
CO2: 25 mEq/L (ref 19–32)
Calcium: 7.8 mg/dL — ABNORMAL LOW (ref 8.4–10.5)
Creatinine, Ser: 1.7 mg/dL — ABNORMAL HIGH (ref 0.50–1.35)
GFR calc Af Amer: 41 mL/min — ABNORMAL LOW (ref >90)
GFR calc non Af Amer: 35 mL/min — ABNORMAL LOW (ref >90)
GLUCOSE: 111 mg/dL — AB (ref 70–99)
POTASSIUM: 4.1 meq/L (ref 3.7–5.3)
Sodium: 143 mEq/L (ref 137–147)
Total Protein: 5.6 g/dL — ABNORMAL LOW (ref 6.0–8.3)

## 2014-05-08 LAB — PSA: PSA: 70.23 ng/mL — ABNORMAL HIGH (ref <=4.00)

## 2014-05-08 MED ORDER — PHYTONADIONE 5 MG PO TABS
10.0000 mg | ORAL_TABLET | Freq: Once | ORAL | Status: AC
  Administered 2014-05-08: 10 mg via ORAL
  Filled 2014-05-08: qty 2

## 2014-05-08 NOTE — Progress Notes (Signed)
ANTICOAGULATION CONSULT NOTE - Follow Up Consult  Pharmacy Consult for Coumadin Indication: AVR  Allergies  Allergen Reactions  . Tetanus Toxoids Hives    Patient Measurements:   Heparin Dosing Weight:   Vital Signs: Temp: 98.1 F (36.7 C) (06/30 1433) Temp src: Oral (06/30 1433) BP: 122/48 mmHg (06/30 1433) Pulse Rate: 77 (06/30 1433)  Labs:  Recent Labs  05/06/14 1216 05/07/14 0448 05/08/14 0940  HGB 9.1* 8.5* 7.8*  HCT 28.5* 26.9* 24.1*  PLT 205 175 157  LABPROT 30.2* 31.7* 35.1*  INR 2.88* 3.07* 3.50*  CREATININE 1.50* 1.90* 1.70*    The CrCl is unknown because both a height and weight (above a minimum accepted value) are required for this calculation.   Medications:  Scheduled:  . aspirin EC  81 mg Oral Daily  . cyanocobalamin  1,000 mcg Intramuscular Q30 days  . donepezil  5 mg Oral QHS  . dorzolamide-timolol  1 drop Both Eyes BID  . latanoprost  1 drop Both Eyes QHS  . metoprolol  25 mg Oral BID  . multivitamin-lutein  1 capsule Oral BID  . pantoprazole  40 mg Oral Daily  . tamsulosin  0.4 mg Oral QHS  . Warfarin - Pharmacist Dosing Inpatient   Does not apply q1800    Assessment: 80yo with recommendation per Ortho for surgery due to impending fracture.  I have discussed this with Dr. Carles Collet, we will hold Coumadin tonight.  INR 3.5 today.  Goal of Therapy:  INR 2.5-3.5 Monitor platelets by anticoagulation protocol: Yes   Plan:  1-  No Coumadin 2-  F/U in AM  Gracy Bruins, PharmD Clinical Pharmacist [facility]

## 2014-05-08 NOTE — Progress Notes (Signed)
Patient ID: Clayton [name], male   DOB: 07/09/1926, 80 y.o.   MRN: [id number] I spoke with one of my partners who feels that the patient could benefit from surgery to stabilize the hip for allowing full weight bearing right away in order to improve his mobility and decrease his pain.  This would be to treat an impending fracture or to basically keep the fracture from extending.  Also, again, it could significantly help his mobility.  He will need medical clearance/stabilization prior to any surgery as well as input from Oncology.

## 2014-05-08 NOTE — Progress Notes (Signed)
PROGRESS NOTE  Clayton [name] KLK:[id number] DOB: August 15, 1926 DOA: 05/06/2014 PCP: [name], MD  Brief history  80 y.o. male with PMH of HTN, HLD, CAD s/p CABG, s/p AVR, legally blind presented with mechanical fall and found to have trochanteric fracture R; Patient fell last night while walking to bathroom nto using his cane/walker. No LOC. As part of the work up for hip, CT of the right hip revealed new finding of crescentic sclerosis concerning for malignancy, therefore raising the concern of a pathologic fracture. MRI of the hip was obtained which did not reveal extension of fracture to greater trochanter. Skeletal survey was negative, but NM bone scan was positive for mets. Orthopedics has been consulted.   Assessment/Plan: Less trochanteric avulsion fracture of the right hip  -Concerned about possibility of pathologic fracture with new crescentic sclerosis noted on CT of the hip  -Appreciate orthopedics consultation  -PT evaluation for mobilization  -No history of malignancy  -SPEP  -Plan as noted by Dr. Ninfa Linden for possible R- hip surgery--family to discuss and let us know final decision on 05/09/14 Crescentic sclerosis/Abnormal Bone Scan/Metstatic Prostate Cancer -Associated with the avulsed fragment  -Skeletal survey negative for metastatic or lytic lesions  -MRI right hip--no extension of fracture to greater trochanter -Nuclear medicine bone scan-positive uptake in the anterior inferior iliiac spine, Right scapular, T10--concerning for met0static disease -PSA--70.2 -consulted Urology, spoke with Dr. Gaynelle Arabian who will see patient -pt already has outpt MedOnc appt with Dr. Juliann Mule on 05/21/14_0  Status post aortic valve replacement  -Hold warfarin tonight -If family agrees to surgery, will need heparin bridge -If the patient needs surgical intervention, he would need to be bridged with heparin drip  Acute on chronic renal failure (CKD stage III)  -Baseline creatinine  1.2-1.4  -Clinically appears hypovolemic  -Discontinue lisinopril  -judicious IVF  Normocytic anemia  -Patient has previous history of B12 deficiency and had previously received B12 injections  -Await iron studies--iron saturation 8%-->ferrous sulfate  -RBC folate--816 -serum B12--872 Coronary artery disease with hx CABG  -Continue aspirin and metoprolol tartrate  -Adjust metoprolol to 25 mg twice a day  Family Communication: discussed with family at bedside--total time 35 min Disposition Plan: Likely SNF        Procedures/Studies: Dg Chest 1 View  05/06/2014   CLINICAL DATA:  Fall, right hip pain  EXAM: CHEST - 1 VIEW  COMPARISON:  10/17/2013  FINDINGS: Cardiomediastinal silhouette is stable. Status post CABG. No acute infiltrate or pleural effusion. No pulmonary edema.  IMPRESSION: No active disease.  Status post CABG.   Electronically Signed   By: Lahoma Crocker M.D.   On: 05/06/2014 13:35   Dg Pelvis 1-2 Views  05/06/2014   CLINICAL DATA:  Fall  EXAM: PELVIS - 1-2 VIEW  COMPARISON:  None.  FINDINGS: There is a displaced fracture of the lesser trochanter. There is also lucency in the intertrochanteric region medially. There is slight varus deformity of the proximal femur. Impaction intertrochanteric fracture is not excluded. Osteopenia.  IMPRESSION: Avulsed fracture of the lesser trochanter.  Possible intertrochanteric femur fracture.  CT is recommended.   Electronically Signed   By: Maryclare Aldridge M.D.   On: 05/06/2014 13:37   Dg Femur Right  05/06/2014   CLINICAL DATA:  Fall  EXAM: RIGHT FEMUR - 2 VIEW  COMPARISON:  None.  FINDINGS: The lesser trochanter is avulsed and displaced superiorly. There are overlapping structures at the intertrochanteric region. There is also  slight varus deformity of the proximal femur. Underlying intertrochanteric fracture is not excluded.  IMPRESSION: Avulsed fracture of the right lesser trochanter. There may be an intertrochanteric fracture. Attention on pelvic  radiograph is recommended.   Electronically Signed   By: Maryclare Eskridge M.D.   On: 05/06/2014 13:35   Ct Head Wo Contrast  05/06/2014   CLINICAL DATA:  Fall  EXAM: CT HEAD WITHOUT CONTRAST  TECHNIQUE: Contiguous axial images were obtained from the base of the skull through the vertex without intravenous contrast.  COMPARISON:  None.  FINDINGS: Global atrophy. Chronic ischemic changes in the periventricular white matter. Cranium is intact. Mastoid air cells clear. Visualized paranasal sinuses are clear.  IMPRESSION: No acute intracranial pathology.   Electronically Signed   By: Maryclare Bertagnolli M.D.   On: 05/06/2014 13:04   Nm Bone Scan Whole Body  05/07/2014   CLINICAL DATA:  Abnormal CT scan. Question metastatic disease. Pathologic fracture.  EXAM: NUCLEAR MEDICINE WHOLE BODY BONE SCAN  TECHNIQUE: Whole body anterior and posterior images were obtained approximately 3 hours after intravenous injection of radiopharmaceutical.  RADIOPHARMACEUTICALS:  Twenty-five mCi Technetium-99 MDP  COMPARISON:  Bone survey 05/06/2014. CT of the right hip 05/06/2014. Thoracic and lumbar spine MRI 10/18/2013. Focal uptake within the scapula on the right is concerning for metastatic disease. This could be within posterior ribs. Minimal uptake is noted within the left lesser trochanter, potentially degenerative. Uptake within the medial aspect of the right knee is degenerative.  FINDINGS: Focal uptake is present along the lesser trochanter fracture and along the anterior inferior iliac spine. The former corresponds with fracture. The ladder with some degenerative change there may be a sclerotic lesion at the anterior inferior iliac spine as well. Focal uptake is present on the right at T10. There was no abnormality on the previous MRI.  IMPRESSION: 1. Uptake at the site of the fracture. 2. Uptake within the anterior inferior iliac spine on the right is concerning for metastatic disease. This corresponds with a focal sclerotic lesion. 3. The  uptake within the right scapula may represent metastatic disease. 4. Focal uptake at T10 on the right was not present on the prior MRI. This may represent new disease.   Electronically Signed   By: Lawrence Santiago M.D.   On: 05/07/2014 16:52   Ct Hip Right Wo Contrast  05/06/2014   CLINICAL DATA:  RIGHT hip pain. Fracture identified on prior radiographs.  EXAM: CT OF THE RIGHT HIP WITHOUT CONTRAST  TECHNIQUE: Multidetector CT imaging was performed according to the standard protocol. Multiplanar CT image reconstructions were also generated.  COMPARISON:  Radiographs 05/06/2014. CT 05/01/2013 at [facility].  FINDINGS: There is an avulsion of the RIGHT lesser trochanter with anterior displacement of 4.3 cm. Proximal retraction is mild, measuring about 2 cm. On the coronal images, there is sclerosis surrounding the lesser trochanter, suggesting an underlying lesion, likely metastatic disease. No intertrochanteric extension of fracture is identified. Moderate RIGHT hip osteoarthritis is present with subchondral cyst in the femoral head and acetabular roof. Fatty atrophy of the gluteus minimus muscle is present. Small nonspecific sclerotic lesion present in the RIGHT iliac bone. Ossification of the L5-S1 disc with ankylosis. Benign bone island in the RIGHT iliac crest. Aortoiliac atherosclerosis.  IMPRESSION: Displaced avulsion fracture of the RIGHT lesser trochanter. Crescentic sclerosis around the location of the avulsed fragment suggests an underlying osseous lesion. This crescentic sclerosis is new compared to the prior CT and is compatible with an underlying osseous metastatic lesion.  Followup whole-body bone scan recommended to assess for other foci of metastatic disease. In this older man, PSA should also be obtained.   Electronically Signed   By: Dereck Ligas M.D.   On: 05/06/2014 15:39   Mr Hip Right Wo Contrast  05/08/2014   CLINICAL DATA:  Status post fall.  Right hip pain.  EXAM: MRI OF THE  RIGHT HIP WITHOUT CONTRAST  TECHNIQUE: Multiplanar, multisequence MR imaging was performed. No intravenous contrast was administered.  COMPARISON:  None.  FINDINGS: HIP JOINTS  Both femoral heads appear normal without evidence of acute fracture, dislocation or avascular necrosis. There is no significant hip joint effusion. There is no gross labral or paralabral abnormality.  BONY PELVIS  There is an avulsion fracture of the right lesser trochanter superiorly displaced. There is adjacent soft tissue edema. There is a 2.3 x 5.3 cm osseous lesion within the proximal right femur at site of the lesser trochanteric avulsion.  There is a 2 cm superior right acetabular T1 hypointense lesion within adjacent 14 mm lesion. There is a 11.4 cm left intertrochanteric T1 hypo intense lesion. There is a small hypo intense lesion in the left posterior acetabulum.  MUSCLES AND TENDONS  Severe edema in the right iliopsoas muscle and tendon with proximal retraction consistent with lesser trochanteric avulsion fracture. Severe soft tissue edema in the extensor and adductor compartment. The gluteus and hamstring tendons appear normal. The piriformis muscles appear symmetric.  INTERNAL PELVIC FINDINGS  The visualized internal pelvic contents are unremarkable.  IMPRESSION: 1. Right lesser trochanteric avulsion fracture with an underlying osseous lesion in the right proximal femur concerning for malignancy. 2. Multiple hypointense lesions involving the right acetabulum, left acetabulum, right proximal femur and left proximal femur most concerning for metastatic disease.   Electronically Signed   By: Kathreen Devoid   On: 05/08/2014 09:05   Dg Bone Survey Met  05/07/2014   CLINICAL DATA:  Metastatic bone survey.  EXAM: METASTATIC BONE SURVEY  COMPARISON:  CT right hip 05/06/2014  FINDINGS: No lytic or sclerotic bone lesions are identified in the axial or appendicular skeleton.  There is an avulsion fracture again demonstrated at the lesser  trochanter region of the right hip.  There is degenerative joint disease.  IMPRESSION: Negative metastatic bone survey for lytic or sclerotic metastatic disease.  Avulsion fracture at the lesser trochanter of the right hip.  Degenerative joint disease.   Electronically Signed   By: Kalman Jewels M.D.   On: 05/07/2014 07:33         Subjective: Patient denies fevers, chills, headache, chest pain, dyspnea, nausea, vomiting, diarrhea, abdominal pain, dysuria, hematuria   Objective: Filed Vitals:   05/07/14 1400 05/07/14 2010 05/08/14 0458 05/08/14 1433  BP: 125/47 114/40 117/60 122/48  Pulse: 75 87 100 77  Temp: 98 F (36.7 C) 98.2 F (36.8 C) 98.2 F (36.8 C) 98.1 F (36.7 C)  TempSrc:  Oral Oral Oral  Resp: _0 SpO2: 97% 98% 100% 97%    Intake/Output Summary (Last 24 hours) at 05/08/14 1522 Last data filed at 05/08/14 1435  Gross per 24 hour  Intake    580 ml  Output    350 ml  Net    230 ml   Weight change:  Exam:   General:  Pt is alert, follows commands appropriately, not in acute distress  HEENT: No icterus, No thrush,  [place]/AT  Cardiovascular: RRR, S1/S2, no rubs, no gallops  Respiratory: CTA bilaterally, no  wheezing, no crackles, no rhonchi  Abdomen: Soft/+BS, non tender, non distended, no guarding  Extremities: 1+LE edema, No lymphangitis, No petechiae, No rashes, no synovitis  Data Reviewed: Basic Metabolic Panel:  Recent Labs Lab 05/06/14 1216 05/07/14 0448 05/08/14 0940  NA 141 142 143  K 4.2 4.4 4.1  CL 104 105 106  CO2 _0 GLUCOSE 151* 124* 111*  BUN 24* 32* 35*  CREATININE 1.50* 1.90* 1.70*  CALCIUM 8.1* 8.1* 7.8*   Liver Function Tests:  Recent Labs Lab 05/07/14 0448 05/08/14 0940  AST 15 14  ALT 10 9  ALKPHOS 145* 136*  BILITOT 0.6 0.8  PROT 5.8* 5.6*  ALBUMIN 2.7* 2.5*   No results found for this basename: LIPASE, AMYLASE,  in the last 168 hours No results found for this basename: AMMONIA,  in the last 168  hours CBC:  Recent Labs Lab 05/06/14 1216 05/07/14 0448 05/08/14 0940  WBC 12.7* 11.7* 12.5*  HGB 9.1* 8.5* 7.8*  HCT 28.5* 26.9* 24.1*  MCV 92.5 94.7 93.8  PLT 205 175 157   Cardiac Enzymes: No results found for this basename: CKTOTAL, CKMB, CKMBINDEX, TROPONINI,  in the last 168 hours BNP: No components found with this basename: POCBNP,  CBG: No results found for this basename: GLUCAP,  in the last 168 hours  No results found for this or any previous visit (from the past 240 hour(s)).   Scheduled Meds: . aspirin EC  81 mg Oral Daily  . cyanocobalamin  1,000 mcg Intramuscular Q30 days  . donepezil  5 mg Oral QHS  . dorzolamide-timolol  1 drop Both Eyes BID  . latanoprost  1 drop Both Eyes QHS  . metoprolol  25 mg Oral BID  . multivitamin-lutein  1 capsule Oral BID  . pantoprazole  40 mg Oral Daily  . tamsulosin  0.4 mg Oral QHS  . warfarin  7 mg Oral Once per day on Sun Thu   And  . warfarin  6 mg Oral Once per day on Mon Tue Wed Fri Sat  . Warfarin - Pharmacist Dosing Inpatient   Does not apply q1800   Continuous Infusions: . sodium chloride 75 mL/hr at 05/07/14 1222     TAT, DAVID, DO  [facility] Pager [phone]  If 7PM-7AM, please contact night-coverage [email or web address] Password [email or web address] 05/08/2014, 3:22 PM   LOS: 2 days

## 2014-05-08 NOTE — Progress Notes (Signed)
PT Cancellation Note  Patient Details Name: Clayton [name] MRN: [id number] DOB: 09/25/1926   Cancelled Treatment:    Reason Eval/Treat Not Completed: Patient at procedure or test/unavailable.  Pt at MRI.  Will f/u another time.     [place], Thornton Papas 05/08/2014, 9:26 AM

## 2014-05-08 NOTE — Care Management Utilization Note (Signed)
Utilization review completed. Susan  Brady, RN BSN Case Manager 

## 2014-05-08 NOTE — Consult Note (Signed)
Urology Consult  Referring physician:   Martie Round Tat Reason for referral:  Possible metastatic prostatic cancer Cc: Dr. Rana Snare, [facility]  History of Present Illness:   80 y.o. male with PMH of HTN, HLD, CAD s/p CABG, s/p AVR, legally blind presented with mechanical fall 3 days ago,  and found to have trochanteric fracture R.  He fell  while walking to bathroom without using his cane/walker. No LOC. As part of the work up for hip, CT of the right hip revealed new finding of  Sclerosis of the lesser trochanter,  concerning for malignancy, therefore raising the concern of a pathologic fracture. MRI of the hip was obtained which did not reveal extension of fracture to greater trochanter. Skeletal survey was negative, but Nuclear Medicine  bone scan was reported as suggestive of  mets in the Right anterior iliac spine, R scapula , and also T 10.   . Orthopedics initially declined surgical role for ORIF, because of mechanical heart valve and chronic coumadin, and large amount of bleeding into fractured area, and general medical decline of the patient. However, reassessment per Dr. Ninfa Linden today reveals likelihood of surgical repair on Thursday, if INR can be normalized. PSA reporterd as 70.    Review of his Urologic history reveals evaluation by Dr. Bascom Levels 2012, with last known psa of 8.0, and was stable over many years, and has no longer been pursued by Dr. Risa Grill because of his age and multiple medical problems.    He is currently voiding well without hematuria.     Past Medical History  Diagnosis Date  . CAD (coronary artery disease)     s/p CABG 1999  . HLD (hyperlipidemia)   . HTN (hypertension)   . GERD (gastroesophageal reflux disease)   . H/O hiatal hernia   . Arthritis   . Peripheral vascular disease     aneursym  . Aortic aneurysm     at least 3 cmm infrarenal AAA by lumbar CT 05/22/13 [facility] 41 mm proximal ascending aorta on echo 05/05/13 [facility])    Past Surgical History  Procedure Laterality Date  . Aortic valve replacement  1998  . Coronary artery bypass graft  1998  . Cholecystectomy    . Tonsillectomy    . US extremity*l* Left     arm  . Inguinal hernia repair  09/25/2013    with mesh  . Inguinal hernia repair Right 09/25/2013    Procedure: HERNIA REPAIR INGUINAL ADULT;  Surgeon: Imogene Burn. Georgette Dover, MD;  Location: [place];  Service: General;  Laterality: Right;  . Insertion of mesh Right 09/25/2013    Procedure: INSERTION OF MESH;  Surgeon: Imogene Burn. Georgette Dover, MD;  Location: [place];  Service: General;  Laterality: Right;  . Hand surgery Left     MULTIPLE     Medications:  Scheduled Meds:  .  aspirin EC  81 mg  Oral  Daily   .  cyanocobalamin  1,000 mcg  Intramuscular  Q30 days   .  donepezil  5 mg  Oral  QHS   .  dorzolamide-timolol  1 drop  Both Eyes  BID   .  latanoprost  1 drop  Both Eyes  QHS   .  metoprolol  25 mg  Oral  BID   .  multivitamin-lutein  1 capsule  Oral  BID   .  pantoprazole  40 mg  Oral  Daily   .  tamsulosin  0.4 mg  Oral  QHS   .  warfarin  7 mg  Oral  Once per day on Sun Thu    And   .  warfarin  6 mg  Oral  Once per day on Mon Tue Wed Fri Sat   .  Warfarin - Pharmacist Dosing Inpatient   Does not apply  q1800     Allergies:  Allergies  Allergen Reactions  . Tetanus Toxoids Hives    Family History  Problem Relation Age of Onset  . Coronary artery disease      Social History:  reports that he has quit smoking. He has never used smokeless tobacco. He reports that he does not drink alcohol or use illicit drugs.  ROS:  Patient denies fevers, chills, headache, chest pain, dyspnea, nausea, vomiting, diarrhea, abdominal pain, dysuria, hematuria    Review of Systems: The patient denies anorexia, fever, weight loss,, vision loss, decreased hearing, hoarseness, chest pain, syncope, dyspnea on exertion, peripheral edema, balance deficits, hemoptysis, abdominal pain, melena, hematochezia, severe  indigestion/heartburn, hematuria, incontinence, genital sores, muscle weakness, suspicious skin lesions, transient blindness, difficulty walking, depression, unusual weight change, abnormal bleeding, enlarged lymph nodes, angioedema, and breast masses.   Physical Exam:  Vital signs in last 24 hours: Temp:  [98.1 F (36.7 C)-98.2 F (36.8 C)] 98.1 F (36.7 C) (06/30 1433) Pulse Rate:  [77-100] 77 (06/30 1433) Resp:  [20-24] 20 (06/30 1433) BP: (114-122)/(40-60) 122/48 mmHg (06/30 1433) SpO2:  [97 %-100 %] 97 % (06/30 1433) Physical Exam  Laboratory Data:  Results for orders placed during the hospital encounter of 05/06/14 (from the past 72 hour(s))  CBC     Status: Abnormal   Collection Time    05/06/14 12:16 PM      Result Value Ref Range   WBC 12.7 (*) 4.0 - 10.5 K/uL   RBC 3.08 (*) 4.22 - 5.81 MIL/uL   Hemoglobin 9.1 (*) 13.0 - 17.0 g/dL   HCT 28.5 (*) 39.0 - 52.0 %   MCV 92.5  78.0 - 100.0 fL   MCH 29.5  26.0 - 34.0 pg   MCHC 31.9  30.0 - 36.0 g/dL   RDW 14.4  11.5 - 15.5 %   Platelets 205  150 - 400 K/uL  BASIC METABOLIC PANEL     Status: Abnormal   Collection Time    05/06/14 12:16 PM      Result Value Ref Range   Sodium 141  137 - 147 mEq/L   Potassium 4.2  3.7 - 5.3 mEq/L   Chloride 104  96 - 112 mEq/L   CO2 25  19 - 32 mEq/L   Glucose, Bld 151 (*) 70 - 99 mg/dL   BUN 24 (*) 6 - 23 mg/dL   Creatinine, Ser 1.50 (*) 0.50 - 1.35 mg/dL   Calcium 8.1 (*) 8.4 - 10.5 mg/dL   GFR calc non Af Amer 41 (*) >90 mL/min   GFR calc Af Amer 47 (*) >90 mL/min   Comment: (NOTE)     The eGFR has been calculated using the CKD EPI equation.     This calculation has not been validated in all clinical situations.     eGFR's persistently <90 mL/min signify possible Chronic Kidney     Disease.  PROTIME-INR     Status: Abnormal   Collection Time    05/06/14 12:16 PM      Result Value Ref Range   Prothrombin Time 30.2 (*) 11.6 - 15.2 seconds   INR 2.88 (*) 0.00 -  1.49  PSA      Status: Abnormal   Collection Time    05/06/14 10:15 PM      Result Value Ref Range   PSA 70.23 (*) <=4.00 ng/mL   Comment: (NOTE)     Test Methodology: ECLIA PSA (Electrochemiluminescence Immunoassay)     For PSA values from 2.5-4.0, particularly in younger men <60 years     old, the AUA and NCCN suggest testing for % Free PSA (3515) and     evaluation of the rate of increase in PSA (PSA velocity).     Performed at [place]     Status: None   Collection Time    05/06/14 10:15 PM      Result Value Ref Range   Ferritin 103  22 - 322 ng/mL   Comment: Performed at [place] TIBC     Status: Abnormal   Collection Time    05/06/14 10:15 PM      Result Value Ref Range   Iron 18 (*) 42 - 135 ug/dL   TIBC 219  215 - 435 ug/dL   Saturation Ratios 8 (*) 20 - 55 %   UIBC 201  125 - 400 ug/dL   Comment: Performed at [place]     Status: None   Collection Time    05/06/14 10:15 PM      Result Value Ref Range   Vitamin B-12 872  211 - 911 pg/mL   Comment: Performed at [place] RBC     Status: Abnormal   Collection Time    05/06/14 10:15 PM      Result Value Ref Range   RBC Folate 816 (*) >280 ng/mL   Comment: Reference range not established for pediatric patients.     Performed at [facility]  CBC     Status: Abnormal   Collection Time    05/07/14  4:48 AM      Result Value Ref Range   WBC 11.7 (*) 4.0 - 10.5 K/uL   RBC 2.84 (*) 4.22 - 5.81 MIL/uL   Hemoglobin 8.5 (*) 13.0 - 17.0 g/dL   HCT 26.9 (*) 39.0 - 52.0 %   MCV 94.7  78.0 - 100.0 fL   MCH 29.9  26.0 - 34.0 pg   MCHC 31.6  30.0 - 36.0 g/dL   RDW 14.5  11.5 - 15.5 %   Platelets 175  150 - 400 K/uL  BASIC METABOLIC PANEL     Status: Abnormal   Collection Time    05/07/14  4:48 AM      Result Value Ref Range   Sodium 142  137 - 147 mEq/L   Potassium 4.4  3.7 - 5.3 mEq/L   Chloride 105  96 - 112 mEq/L   CO2 27  19 - 32  mEq/L   Glucose, Bld 124 (*) 70 - 99 mg/dL   BUN 32 (*) 6 - 23 mg/dL   Creatinine, Ser 1.90 (*) 0.50 - 1.35 mg/dL   Calcium 8.1 (*) 8.4 - 10.5 mg/dL   GFR calc non Af Amer 31 (*) >90 mL/min   GFR calc Af Amer 35 (*) >90 mL/min   Comment: (NOTE)     The eGFR has been calculated using the CKD EPI equation.     This calculation has not been validated in all clinical situations.     eGFR's persistently <90 mL/min signify  possible Chronic Kidney     Disease.  PROTIME-INR     Status: Abnormal   Collection Time    05/07/14  4:48 AM      Result Value Ref Range   Prothrombin Time 31.7 (*) 11.6 - 15.2 seconds   INR 3.07 (*) 0.00 - 1.49  HEPATIC FUNCTION PANEL     Status: Abnormal   Collection Time    05/07/14  4:48 AM      Result Value Ref Range   Total Protein 5.8 (*) 6.0 - 8.3 g/dL   Albumin 2.7 (*) 3.5 - 5.2 g/dL   AST 15  0 - 37 U/L   ALT 10  0 - 53 U/L   Alkaline Phosphatase 145 (*) 39 - 117 U/L   Total Bilirubin 0.6  0.3 - 1.2 mg/dL   Bilirubin, Direct <0.2  0.0 - 0.3 mg/dL   Indirect Bilirubin NOT CALCULATED  0.3 - 0.9 mg/dL  PROTIME-INR     Status: Abnormal   Collection Time    05/08/14  9:40 AM      Result Value Ref Range   Prothrombin Time 35.1 (*) 11.6 - 15.2 seconds   INR 3.50 (*) 0.00 - 1.49  COMPREHENSIVE METABOLIC PANEL     Status: Abnormal   Collection Time    05/08/14  9:40 AM      Result Value Ref Range   Sodium 143  137 - 147 mEq/L   Potassium 4.1  3.7 - 5.3 mEq/L   Chloride 106  96 - 112 mEq/L   CO2 25  19 - 32 mEq/L   Glucose, Bld 111 (*) 70 - 99 mg/dL   BUN 35 (*) 6 - 23 mg/dL   Creatinine, Ser 1.70 (*) 0.50 - 1.35 mg/dL   Calcium 7.8 (*) 8.4 - 10.5 mg/dL   Total Protein 5.6 (*) 6.0 - 8.3 g/dL   Albumin 2.5 (*) 3.5 - 5.2 g/dL   AST 14  0 - 37 U/L   ALT 9  0 - 53 U/L   Alkaline Phosphatase 136 (*) 39 - 117 U/L   Total Bilirubin 0.8  0.3 - 1.2 mg/dL   GFR calc non Af Amer 35 (*) >90 mL/min   GFR calc Af Amer 41 (*) >90 mL/min   Comment: (NOTE)      The eGFR has been calculated using the CKD EPI equation.     This calculation has not been validated in all clinical situations.     eGFR's persistently <90 mL/min signify possible Chronic Kidney     Disease.  CBC     Status: Abnormal   Collection Time    05/08/14  9:40 AM      Result Value Ref Range   WBC 12.5 (*) 4.0 - 10.5 K/uL   RBC 2.57 (*) 4.22 - 5.81 MIL/uL   Hemoglobin 7.8 (*) 13.0 - 17.0 g/dL   HCT 24.1 (*) 39.0 - 52.0 %   MCV 93.8  78.0 - 100.0 fL   MCH 30.4  26.0 - 34.0 pg   MCHC 32.4  30.0 - 36.0 g/dL   RDW 14.2  11.5 - 15.5 %   Platelets 157  150 - 400 K/uL   No results found for this or any previous visit (from the past 240 hour(s)). Creatinine:  Recent Labs  05/06/14 1216 05/07/14 0448 05/08/14 0940  CREATININE 1.50* 1.90* 1.70*  Dg Chest 1 View 05/06/2014 CLINICAL DATA: Fall, right hip pain EXAM: CHEST - 1 VIEW COMPARISON: 10/17/2013  FINDINGS: Cardiomediastinal silhouette is stable. Status post CABG. No acute infiltrate or pleural effusion. No pulmonary edema. IMPRESSION: No active disease. Status post CABG. Electronically Signed By: Lahoma Crocker M.D. On: 05/06/2014 13:35  Dg Pelvis 1-2 Views  05/06/2014 CLINICAL DATA: Fall EXAM: PELVIS - 1-2 VIEW COMPARISON: None. FINDINGS: There is a displaced fracture of the lesser trochanter. There is also lucency in the intertrochanteric region medially. There is slight varus deformity of the proximal femur. Impaction intertrochanteric fracture is not excluded. Osteopenia. IMPRESSION: Avulsed fracture of the lesser trochanter. Possible intertrochanteric femur fracture. CT is recommended. Electronically Signed By: Maryclare Stetler M.D. On: 05/06/2014 13:37  Dg Femur Right  05/06/2014 CLINICAL DATA: Fall EXAM: RIGHT FEMUR - 2 VIEW COMPARISON: None. FINDINGS: The lesser trochanter is avulsed and displaced superiorly. There are overlapping structures at the intertrochanteric region. There is also slight varus deformity of the proximal femur.  Underlying intertrochanteric fracture is not excluded. IMPRESSION: Avulsed fracture of the right lesser trochanter. There may be an intertrochanteric fracture. Attention on pelvic radiograph is recommended. Electronically Signed By: Maryclare Bussey M.D. On: 05/06/2014 13:35  Ct Head Wo Contrast  05/06/2014 CLINICAL DATA: Fall EXAM: CT HEAD WITHOUT CONTRAST TECHNIQUE: Contiguous axial images were obtained from the base of the skull through the vertex without intravenous contrast. COMPARISON: None. FINDINGS: Global atrophy. Chronic ischemic changes in the periventricular white matter. Cranium is intact. Mastoid air cells clear. Visualized paranasal sinuses are clear. IMPRESSION: No acute intracranial pathology. Electronically Signed By: Maryclare Setterlund M.D. On: 05/06/2014 13:04  Ct Hip Right Wo Contrast  05/06/2014 CLINICAL DATA: RIGHT hip pain. Fracture identified on prior radiographs. EXAM: CT OF THE RIGHT HIP WITHOUT CONTRAST TECHNIQUE: Multidetector CT imaging was performed according to the standard protocol. Multiplanar CT image reconstructions were also generated. COMPARISON: Radiographs 05/06/2014. CT 05/01/2013 at [facility]. FINDINGS: There is an avulsion of the RIGHT lesser trochanter with anterior displacement of 4.3 cm. Proximal retraction is mild, measuring about 2 cm. On the coronal images, there is sclerosis surrounding the lesser trochanter, suggesting an underlying lesion, likely metastatic disease. No intertrochanteric extension of fracture is identified. Moderate RIGHT hip osteoarthritis is present with subchondral cyst in the femoral head and acetabular roof. Fatty atrophy of the gluteus minimus muscle is present. Small nonspecific sclerotic lesion present in the RIGHT iliac bone. Ossification of the L5-S1 disc with ankylosis. Benign bone island in the RIGHT iliac crest. Aortoiliac atherosclerosis. IMPRESSION: Displaced avulsion fracture of the RIGHT lesser trochanter. Crescentic sclerosis around the  location of the avulsed fragment suggests an underlying osseous lesion. This crescentic sclerosis is new compared to the prior CT and is compatible with an underlying osseous metastatic lesion. Followup whole-body bone scan recommended to assess for other foci of metastatic disease. In this older man, PSA should also be obtained. Electronically Signed By: Dereck Ligas M.D. On: 05/06/2014 15:39  Dg Bone Survey Met  05/07/2014 CLINICAL DATA: Metastatic bone survey. EXAM: METASTATIC BONE SURVEY COMPARISON: CT right hip 05/06/2014 FINDINGS: No lytic or sclerotic bone lesions are identified in the axial or appendicular skeleton. There is an avulsion fracture again demonstrated at the lesser trochanter region of the right hip. There is degenerative joint disease. IMPRESSION: Negative metastatic bone survey for lytic or sclerotic metastatic disease. Avulsion fracture at the lesser trochanter of the right hip. Degenerative joint disease. Electronically Signed By: Kalman Jewels M.D. On: 05/07/2014 07:33    Impression/Assessment:   Possible metastatic CaP, but Medicare requires tissue diagnosis via biopsy before any treatment can be performed (  ie; hormone). Hie hip is his most important problem, and he whould RTC to see Dr. Rana Snare for follow up repeat psa testing and possible biopsy. If he has open hip surgery, perhaps Orthopedics can send bone marrow sample opff for psa staining to make a diagnosis.   Plan:   Will follow as needed. Dr. Risa Grill aware of pt. Please call [phone] for on call urologist as needed. I will be unavailable from Thursday.    TANNENBAUM, SIGMUND I 05/08/2014, 7:23 PM

## 2014-05-08 NOTE — Progress Notes (Signed)
Subjective: No acute changes.  Poor mobility with therapy given his weight-bearing status.  I reviewed his MRI and it shows a lesion in his proximal femur with his lesser trochanter avulsion fracture.  There does not seem to be extension of the fracture into the greater trochanteric area.  Objective: Vital signs in last 24 hours: Temp:  [98 F (36.7 C)-98.2 F (36.8 C)] 98.2 F (36.8 C) (06/30 0458) Pulse Rate:  [75-100] 100 (06/30 0458) Resp:  [18-24] 24 (06/30 0458) BP: (114-125)/(40-60) 117/60 mmHg (06/30 0458) SpO2:  [97 %-100 %] 100 % (06/30 0458)  Intake/Output from previous day: 06/29 0701 - 06/30 0700 In: 480 [P.O.:480] Out: 550 [Urine:550] Intake/Output this shift:     Recent Labs  05/06/14 1216 05/07/14 0448 05/08/14 0940  HGB 9.1* 8.5* 7.8*    Recent Labs  05/07/14 0448 05/08/14 0940  WBC 11.7* 12.5*  RBC 2.84* 2.57*  HCT 26.9* 24.1*  PLT 175 157    Recent Labs  05/07/14 0448 05/08/14 0940  NA 142 143  K 4.4 4.1  CL 105 106  CO2 27 25  BUN 32* 35*  CREATININE 1.90* 1.70*  GLUCOSE 124* 111*  CALCIUM 8.1* 7.8*    Recent Labs  05/07/14 0448 05/08/14 0940  INR 3.07* 3.50*    I can move hi right hip through rotation, but it does cause pain to be expected  Assessment/Plan: Right hip lesser troch avulsion fracture and metastatic lesion 1)  From an Ortho standpoint, I will try to treat this non-operatively.  No surgery is needed thus far unless there is extension of the fracture.   Mcarthur Rossetti 05/08/2014, 12:53 PM

## 2014-05-09 DIAGNOSIS — D649 Anemia, unspecified: Secondary | ICD-10-CM

## 2014-05-09 DIAGNOSIS — F0390 Unspecified dementia without behavioral disturbance: Secondary | ICD-10-CM

## 2014-05-09 DIAGNOSIS — D62 Acute posthemorrhagic anemia: Secondary | ICD-10-CM | POA: Diagnosis not present

## 2014-05-09 LAB — URINALYSIS W MICROSCOPIC (NOT AT ARMC)
GLUCOSE, UA: NEGATIVE mg/dL
HGB URINE DIPSTICK: NEGATIVE
KETONES UR: NEGATIVE mg/dL
Leukocytes, UA: NEGATIVE
NITRITE: NEGATIVE
Protein, ur: NEGATIVE mg/dL
SPECIFIC GRAVITY, URINE: 1.022 (ref 1.005–1.030)
Urobilinogen, UA: 1 mg/dL (ref 0.0–1.0)
pH: 5 (ref 5.0–8.0)

## 2014-05-09 LAB — PROTEIN ELECTROPHORESIS, SERUM
ALBUMIN ELP: 53.3 % — AB (ref 55.8–66.1)
ALPHA-1-GLOBULIN: 6.5 % — AB (ref 2.9–4.9)
ALPHA-2-GLOBULIN: 12.7 % — AB (ref 7.1–11.8)
BETA GLOBULIN: 7.1 % (ref 4.7–7.2)
Beta 2: 6.6 % — ABNORMAL HIGH (ref 3.2–6.5)
GAMMA GLOBULIN: 13.8 % (ref 11.1–18.8)
M-Spike, %: NOT DETECTED g/dL
Total Protein ELP: 5.4 g/dL — ABNORMAL LOW (ref 6.0–8.3)

## 2014-05-09 LAB — SURGICAL PCR SCREEN
MRSA, PCR: NEGATIVE
Staphylococcus aureus: NEGATIVE

## 2014-05-09 LAB — PREPARE RBC (CROSSMATCH)

## 2014-05-09 LAB — CBC
HCT: 21.8 % — ABNORMAL LOW (ref 39.0–52.0)
Hemoglobin: 7 g/dL — ABNORMAL LOW (ref 13.0–17.0)
MCH: 29.2 pg (ref 26.0–34.0)
MCHC: 32.1 g/dL (ref 30.0–36.0)
MCV: 90.8 fL (ref 78.0–100.0)
Platelets: 189 10*3/uL (ref 150–400)
RBC: 2.4 MIL/uL — ABNORMAL LOW (ref 4.22–5.81)
RDW: 14.2 % (ref 11.5–15.5)
WBC: 11.8 10*3/uL — AB (ref 4.0–10.5)

## 2014-05-09 LAB — PROTIME-INR
INR: 2.55 — ABNORMAL HIGH (ref 0.00–1.49)
Prothrombin Time: 27.4 seconds — ABNORMAL HIGH (ref 11.6–15.2)

## 2014-05-09 LAB — ABO/RH: ABO/RH(D): A POS

## 2014-05-09 MED ORDER — PHYTONADIONE 5 MG PO TABS
5.0000 mg | ORAL_TABLET | Freq: Once | ORAL | Status: AC
  Administered 2014-05-09: 5 mg via ORAL
  Filled 2014-05-09: qty 1

## 2014-05-09 MED ORDER — ENOXAPARIN SODIUM 40 MG/0.4ML ~~LOC~~ SOLN
40.0000 mg | Freq: Once | SUBCUTANEOUS | Status: AC
  Administered 2014-05-09: 40 mg via SUBCUTANEOUS
  Filled 2014-05-09: qty 0.4

## 2014-05-09 MED ORDER — FUROSEMIDE 10 MG/ML IJ SOLN
20.0000 mg | Freq: Once | INTRAMUSCULAR | Status: AC
  Administered 2014-05-10: 20 mg via INTRAVENOUS
  Filled 2014-05-09: qty 2

## 2014-05-09 NOTE — Progress Notes (Signed)
Patient ID: Clayton [name], male   DOB: January 24, 1926, 80 y.o.   MRN: [id number] I have spoken to his wife in length and have recommended surgery to stabilize his right hip so he can put full weight right away on that hip.  This will certainly help decrease his pain and improve his mobility and quality of life.  His INR is still high.  Will give another dose of vitamin K.  Also, he has been quite anemic, so I will check another CBC today and transfuse as necessary prior to surgery.  Will plan on surgery tomorrow evening 7/2 vs Friday 7/3.  Will give dose of lovenox today.

## 2014-05-09 NOTE — Clinical Social Work Note (Signed)
Noted plans for OR on tomorrow or Friday per MD note- CSW has secured SNF bed at [facility] for when he is medically ready- will need to give SNF some notice on dc date as we know to confirm bed availability.  Eduard Clos, MSW, [place]

## 2014-05-09 NOTE — Progress Notes (Signed)
Physical Therapy Treatment Patient Details Name: Clayton [name] MRN: [id number] DOB: 01/19/1926 Today's Date: 05/09/2014    History of Present Illness Patient fell last night while walking to bathroom nto using his cane/walker with resultant lesser trochanter avulsion fracture. PMHx of HTN, HPL, CAD s/p CABG, s/p AVR, legally blind     PT Comments    Patient able to assist this session with mobility and stand however still limited with mobility due to inability to maintain TWB. Possible surgery per MD note. Continue to recommend SNF for ongoing Physical Therapy.     Follow Up Recommendations  SNF     Equipment Recommendations  None recommended by PT    Recommendations for Other Services       Precautions / Restrictions Precautions Precautions: Fall Restrictions Weight Bearing Restrictions: Yes RLE Weight Bearing: Touchdown weight bearing Other Position/Activity Restrictions: pt is legally blind.      Mobility  Bed Mobility         Supine to sit: Mod assist     General bed mobility comments: Patient able to assist more with use of rail this session. Mod A for LEs out of bed  Transfers Overall transfer level: Needs assistance Equipment used: 2 person hand held assist   Sit to Stand: Mod assist;+2 physical assistance Stand pivot transfers: Mod assist;+2 physical assistance       General transfer comment: cues for safe technique and encouragement.  pt with difficulty maintaining TDWBing on R LE.    Ambulation/Gait                 Stairs            Wheelchair Mobility    Modified Rankin (Stroke Patients Only)       Balance                                    Cognition Arousal/Alertness: Awake/alert Behavior During Therapy: WFL for tasks assessed/performed Overall Cognitive Status: Within Functional Limits for tasks assessed                      Exercises      General Comments        Pertinent Vitals/Pain no  apparent distress     Home Living                      Prior Function            PT Goals (current goals can now be found in the care plan section) Progress towards PT goals: Progressing toward goals    Frequency  Min 3X/week    PT Plan Current plan remains appropriate    Co-evaluation             End of Session Equipment Utilized During Treatment: Gait belt Activity Tolerance: Patient tolerated treatment well Patient left: in chair;with call bell/phone within reach     Time: 1040-1056 PT Time Calculation (min): 16 min  Charges:  $Therapeutic Activity: 8-22 mins                    G Codes:      Jacqualyn Posey 05/09/2014, 11:59 AM 05/09/2014 Jacqualyn Posey PTA [phone] pager [phone] office

## 2014-05-09 NOTE — Progress Notes (Addendum)
PROGRESS NOTE  Clayton [name] ZHY:[id number] DOB: 01-Apr-1926 DOA: 05/06/2014 PCP: [name], MD  Brief history  80 y.o. male with PMH of HTN, HLD, CAD s/p CABG, s/p AVR, legally blind presented with mechanical fall and found to have trochanteric fracture R; Patient fell last night while walking to bathroom nto using his cane/walker. No LOC. As part of the work up for hip, CT of the right hip revealed new finding of crescentic sclerosis concerning for malignancy, therefore raising the concern of a pathologic fracture. MRI of the hip was obtained which did not reveal extension of fracture to greater trochanter. Skeletal survey was negative, but NM bone scan was positive for mets. Orthopedics has been consulted.   Assessment/Plan: Less trochanteric avulsion fracture of the right hip  -Concerned about possibility of pathologic fracture with new crescentic sclerosis noted on CT of the hip  Family has decided on surgery.  Not able to do today due to anemia-trending downward since admission   Crescentic sclerosis/Abnormal Bone Scan/Metstatic Prostate Cancer -Associated with the avulsed fragment  -Skeletal survey negative for metastatic or lytic lesions  -MRI right hip--no extension of fracture to greater trochanter -Nuclear medicine bone scan-positive uptake in the anterior inferior iliiac spine, Right scapular, T10--concerning for met0static disease -PSA--70.2 (in the office had been stable for years at 60) -Urology agrees likely prostate with mets.  Needs tissue diagnosis from surgery -pt already has outpt MedOnc appt with Dr. Juliann Mule on 05/21/14@130pm   Dementia without behavioral disturbance: Stable so far.  No sundowning.  Continue Aricept  Glaucoma: Continue drops.  Stable.  Status post aortic valve replacement  -Hold warfarin tonight -If family agrees to surgery, will need heparin bridge -If the patient needs surgical intervention, he would need to be bridged with heparin drip    Acute on chronic renal failure (CKD stage III)  -Baseline creatinine 1.2-1.4  -Clinically appears hypovolemic  -Discontinue lisinopril  -judicious IVF   Normocytic anemia  -Patient has previous history of B12 deficiency and had previously received B12 injections  -Await iron studies--iron saturation 8%-->ferrous sulfate  -RBC folate--816 -serum B12--872 Noted drop since admission.  May be in part from hemo dilution.  Transfuse if repeat CBC lower.   Coronary artery disease with hx CABG  -Continue aspirin and metoprolol tartrate  -Adjust metoprolol to 25 mg twice a day   Family Communication: discussed with family at bedside--total time 25 min Disposition Plan: Likely SNF    Procedures/Studies: Surgery Pending  Subjective: Patient tired, denies any pain.  No SOB.  Objective: Filed Vitals:   05/08/14 1433 05/08/14 2054 05/09/14 0539 05/09/14 1412  BP: 122/48 119/58 137/53 110/49  Pulse: 77 94 80 73  Temp: 98.1 F (36.7 C) 98 F (36.7 C) 98.1 F (36.7 C) 97.6 F (36.4 C)  TempSrc: Oral   Oral  Resp: 20 20 20 18   SpO2: 97% 96% 95% 98%    Intake/Output Summary (Last 24 hours) at 05/09/14 1616 Last data filed at 05/09/14 1300  Gross per 24 hour  Intake    320 ml  Output    400 ml  Net    -80 ml   Weight change:  Exam:   General:  Pt resting comfortably, fatigued, NAD  Cardiovascular: RRR, S1/S2,  Respiratory: CTA bilaterally, with no wheezing, no crackles, no rhonchi  Abdomen: Soft/+BS, non tender, non distended,  Extremities: 1+LE edema, no clubbing or cyanosis, further musculoskeletal exam deffered secondary to fracture   Data  Reviewed: Basic Metabolic Panel:  Recent Labs Lab 05/06/14 1216 05/07/14 0448 05/08/14 0940  NA 141 142 143  K 4.2 4.4 4.1  CL 104 105 106  CO2 25 27 25   GLUCOSE 151* 124* 111*  BUN 24* 32* 35*  CREATININE 1.50* 1.90* 1.70*  CALCIUM 8.1* 8.1* 7.8*   Liver Function Tests:  Recent Labs Lab 05/07/14 0448  05/08/14 0940  AST 15 14  ALT 10 9  ALKPHOS 145* 136*  BILITOT 0.6 0.8  PROT 5.8* 5.6*  ALBUMIN 2.7* 2.5*   CBC:  Recent Labs Lab 05/06/14 1216 05/07/14 0448 05/08/14 0940 05/09/14 1515  WBC 12.7* 11.7* 12.5* 11.8*  HGB 9.1* 8.5* 7.8* 7.0*  HCT 28.5* 26.9* 24.1* 21.8*  MCV 92.5 94.7 93.8 90.8  PLT 205 175 157 189     Scheduled Meds: . aspirin EC  81 mg Oral Daily  . cyanocobalamin  1,000 mcg Intramuscular Q30 days  . donepezil  5 mg Oral QHS  . dorzolamide-timolol  1 drop Both Eyes BID  . latanoprost  1 drop Both Eyes QHS  . metoprolol  25 mg Oral BID  . multivitamin-lutein  1 capsule Oral BID  . pantoprazole  40 mg Oral Daily  . tamsulosin  0.4 mg Oral QHS   Continuous Infusions: . sodium chloride 75 mL/hr at 05/07/14 1222     Annita Brod, MD [facility] Pager [phone]  If 7PM-7AM, please contact night-coverage [email or web address] Password [email or web address] 05/09/2014, 4:16 PM   LOS: 3 days

## 2014-05-10 ENCOUNTER — Encounter (HOSPITAL_COMMUNITY)
Admission: EM | Disposition: A | Discharge status: Skilled Nursing Facility | Payer: Self-pay | Source: Home / Self Care | Attending: Internal Medicine

## 2014-05-10 ENCOUNTER — Encounter (HOSPITAL_COMMUNITY): Payer: Medicare HMO | Admitting: Anesthesiology

## 2014-05-10 ENCOUNTER — Inpatient Hospital Stay (HOSPITAL_COMMUNITY): Payer: Medicare HMO

## 2014-05-10 ENCOUNTER — Encounter (HOSPITAL_COMMUNITY): Payer: Self-pay | Admitting: General Practice

## 2014-05-10 ENCOUNTER — Inpatient Hospital Stay (HOSPITAL_COMMUNITY): Payer: Medicare HMO | Admitting: Anesthesiology

## 2014-05-10 HISTORY — PX: INTRAMEDULLARY (IM) NAIL INTERTROCHANTERIC: SHX5875

## 2014-05-10 LAB — BASIC METABOLIC PANEL
Anion gap: 11 (ref 5–15)
BUN: 24 mg/dL — AB (ref 6–23)
CHLORIDE: 104 meq/L (ref 96–112)
CO2: 25 meq/L (ref 19–32)
Calcium: 7.6 mg/dL — ABNORMAL LOW (ref 8.4–10.5)
Creatinine, Ser: 1.24 mg/dL (ref 0.50–1.35)
GFR calc Af Amer: 59 mL/min — ABNORMAL LOW (ref >90)
GFR calc non Af Amer: 51 mL/min — ABNORMAL LOW (ref >90)
Glucose, Bld: 128 mg/dL — ABNORMAL HIGH (ref 70–99)
Potassium: 3.8 mEq/L (ref 3.7–5.3)
Sodium: 140 mEq/L (ref 137–147)

## 2014-05-10 LAB — CBC
HEMATOCRIT: 26.3 % — AB (ref 39.0–52.0)
HEMOGLOBIN: 8.5 g/dL — AB (ref 13.0–17.0)
MCH: 29.3 pg (ref 26.0–34.0)
MCHC: 32.3 g/dL (ref 30.0–36.0)
MCV: 90.7 fL (ref 78.0–100.0)
Platelets: 159 10*3/uL (ref 150–400)
RBC: 2.9 MIL/uL — AB (ref 4.22–5.81)
RDW: 14.3 % (ref 11.5–15.5)
WBC: 10 10*3/uL (ref 4.0–10.5)

## 2014-05-10 LAB — GLUCOSE, CAPILLARY: GLUCOSE-CAPILLARY: 124 mg/dL — AB (ref 70–99)

## 2014-05-10 LAB — PROTIME-INR
INR: 1.59 — ABNORMAL HIGH (ref 0.00–1.49)
Prothrombin Time: 19 seconds — ABNORMAL HIGH (ref 11.6–15.2)

## 2014-05-10 SURGERY — FIXATION, FRACTURE, INTERTROCHANTERIC, WITH INTRAMEDULLARY ROD
Anesthesia: General | Site: Hip | Laterality: Right

## 2014-05-10 MED ORDER — WARFARIN SODIUM 7.5 MG PO TABS
7.5000 mg | ORAL_TABLET | Freq: Once | ORAL | Status: AC
  Administered 2014-05-10: 7.5 mg via ORAL
  Filled 2014-05-10 (×2): qty 1

## 2014-05-10 MED ORDER — DOCUSATE SODIUM 100 MG PO CAPS
100.0000 mg | ORAL_CAPSULE | Freq: Two times a day (BID) | ORAL | Status: DC
  Administered 2014-05-10 – 2014-05-14 (×8): 100 mg via ORAL
  Filled 2014-05-10 (×9): qty 1

## 2014-05-10 MED ORDER — ALUM & MAG HYDROXIDE-SIMETH 200-200-20 MG/5ML PO SUSP: 30.0000 mL | ORAL | Status: DC | PRN

## 2014-05-10 MED ORDER — FENTANYL CITRATE 0.05 MG/ML IJ SOLN
INTRAMUSCULAR | Status: AC
  Filled 2014-05-10: qty 5

## 2014-05-10 MED ORDER — MORPHINE SULFATE 2 MG/ML IJ SOLN: 0.5000 mg | INTRAMUSCULAR | Status: DC | PRN

## 2014-05-10 MED ORDER — HYDROCODONE-ACETAMINOPHEN 5-325 MG PO TABS
1.0000 | ORAL_TABLET | Freq: Four times a day (QID) | ORAL | Status: DC | PRN
  Administered 2014-05-10 – 2014-05-14 (×6): 2 via ORAL
  Filled 2014-05-10 (×5): qty 2
  Filled 2014-05-10: qty 1
  Filled 2014-05-10: qty 2

## 2014-05-10 MED ORDER — ONDANSETRON HCL 4 MG/2ML IJ SOLN: 4.0000 mg | Freq: Four times a day (QID) | INTRAMUSCULAR | Status: DC | PRN

## 2014-05-10 MED ORDER — SODIUM CHLORIDE 0.9 % IV SOLN: INTRAVENOUS | Status: DC

## 2014-05-10 MED ORDER — PROPOFOL 10 MG/ML IV BOLUS
INTRAVENOUS | Status: DC | PRN
  Administered 2014-05-10: 10 mg via INTRAVENOUS

## 2014-05-10 MED ORDER — ONDANSETRON HCL 4 MG PO TABS: 4.0000 mg | ORAL_TABLET | Freq: Four times a day (QID) | ORAL | Status: DC | PRN

## 2014-05-10 MED ORDER — METHOCARBAMOL 500 MG PO TABS
500.0000 mg | ORAL_TABLET | Freq: Four times a day (QID) | ORAL | Status: DC | PRN
  Administered 2014-05-13: 500 mg via ORAL
  Filled 2014-05-10: qty 1

## 2014-05-10 MED ORDER — WARFARIN - PHARMACIST DOSING INPATIENT
Freq: Every day | Status: DC
  Administered 2014-05-12: 1
  Administered 2014-05-13: 18:00:00

## 2014-05-10 MED ORDER — PHENOL 1.4 % MT LIQD
1.0000 | OROMUCOSAL | Status: DC | PRN
  Administered 2014-05-11: 1 via OROMUCOSAL
  Filled 2014-05-10: qty 177

## 2014-05-10 MED ORDER — ENOXAPARIN SODIUM 40 MG/0.4ML ~~LOC~~ SOLN
40.0000 mg | Freq: Once | SUBCUTANEOUS | Status: AC
  Administered 2014-05-11: 40 mg via SUBCUTANEOUS
  Filled 2014-05-10: qty 0.4

## 2014-05-10 MED ORDER — FENTANYL CITRATE 0.05 MG/ML IJ SOLN
INTRAMUSCULAR | Status: DC | PRN
  Administered 2014-05-10 (×2): 50 ug via INTRAVENOUS

## 2014-05-10 MED ORDER — CEFAZOLIN SODIUM-DEXTROSE 2-3 GM-% IV SOLR
INTRAVENOUS | Status: DC | PRN
  Administered 2014-05-10: 2 g via INTRAVENOUS

## 2014-05-10 MED ORDER — CEFAZOLIN SODIUM-DEXTROSE 2-3 GM-% IV SOLR
2.0000 g | Freq: Four times a day (QID) | INTRAVENOUS | Status: AC
  Administered 2014-05-10 – 2014-05-11 (×2): 2 g via INTRAVENOUS
  Filled 2014-05-10 (×2): qty 50

## 2014-05-10 MED ORDER — MENTHOL 3 MG MT LOZG: 1.0000 | LOZENGE | OROMUCOSAL | Status: DC | PRN

## 2014-05-10 MED ORDER — LACTATED RINGERS IV SOLN
INTRAVENOUS | Status: DC | PRN
  Administered 2014-05-10: 18:00:00 via INTRAVENOUS

## 2014-05-10 MED ORDER — ONDANSETRON HCL 4 MG/2ML IJ SOLN: 4.0000 mg | Freq: Once | INTRAMUSCULAR | Status: DC | PRN

## 2014-05-10 MED ORDER — FENTANYL CITRATE 0.05 MG/ML IJ SOLN
INTRAMUSCULAR | Status: AC
  Filled 2014-05-10: qty 2

## 2014-05-10 MED ORDER — PHENYLEPHRINE HCL 10 MG/ML IJ SOLN
INTRAMUSCULAR | Status: DC | PRN
  Administered 2014-05-10 (×2): 80 ug via INTRAVENOUS

## 2014-05-10 MED ORDER — METOCLOPRAMIDE HCL 10 MG PO TABS: 5.0000 mg | ORAL_TABLET | Freq: Three times a day (TID) | ORAL | Status: DC | PRN

## 2014-05-10 MED ORDER — METHOCARBAMOL 1000 MG/10ML IJ SOLN
500.0000 mg | Freq: Four times a day (QID) | INTRAMUSCULAR | Status: DC | PRN
  Filled 2014-05-10: qty 5

## 2014-05-10 MED ORDER — 0.9 % SODIUM CHLORIDE (POUR BTL) OPTIME
TOPICAL | Status: DC | PRN
  Administered 2014-05-10: 1000 mL

## 2014-05-10 MED ORDER — ACETAMINOPHEN 325 MG PO TABS: 650.0000 mg | ORAL_TABLET | Freq: Four times a day (QID) | ORAL | Status: DC | PRN

## 2014-05-10 MED ORDER — ACETAMINOPHEN 650 MG RE SUPP: 650.0000 mg | Freq: Four times a day (QID) | RECTAL | Status: DC | PRN

## 2014-05-10 MED ORDER — CEFAZOLIN SODIUM-DEXTROSE 2-3 GM-% IV SOLR
2.0000 g | Freq: Three times a day (TID) | INTRAVENOUS | Status: DC
  Filled 2014-05-10 (×2): qty 50

## 2014-05-10 MED ORDER — SUCCINYLCHOLINE CHLORIDE 20 MG/ML IJ SOLN
INTRAMUSCULAR | Status: DC | PRN
  Administered 2014-05-10: 100 mg via INTRAVENOUS

## 2014-05-10 MED ORDER — METOCLOPRAMIDE HCL 5 MG/ML IJ SOLN: 5.0000 mg | Freq: Three times a day (TID) | INTRAMUSCULAR | Status: DC | PRN

## 2014-05-10 MED ORDER — LIDOCAINE HCL (CARDIAC) 20 MG/ML IV SOLN
INTRAVENOUS | Status: DC | PRN
  Administered 2014-05-10: 50 mg via INTRAVENOUS

## 2014-05-10 MED ORDER — FENTANYL CITRATE 0.05 MG/ML IJ SOLN
25.0000 ug | INTRAMUSCULAR | Status: DC | PRN
  Administered 2014-05-10: 25 ug via INTRAVENOUS

## 2014-05-10 SURGICAL SUPPLY — 55 items
BANDAGE GAUZE ELAST BULKY 4 IN (GAUZE/BANDAGES/DRESSINGS) ×3 IMPLANT
BLADE SURG 15 STRL LF DISP TIS (BLADE) ×1 IMPLANT
BLADE SURG 15 STRL SS (BLADE) ×3
BNDG COHESIVE 4X5 TAN NS LF (GAUZE/BANDAGES/DRESSINGS) ×3 IMPLANT
CANISTER SUCTION 2500CC (MISCELLANEOUS) ×2 IMPLANT
COVER PERINEAL POST (MISCELLANEOUS) ×3 IMPLANT
COVER SURGICAL LIGHT HANDLE (MISCELLANEOUS) ×3 IMPLANT
DRAPE PROXIMA HALF (DRAPES) IMPLANT
DRAPE STERI IOBAN 125X83 (DRAPES) ×3 IMPLANT
DRSG AQUACEL AG ADV 3.5X10 (GAUZE/BANDAGES/DRESSINGS) ×2 IMPLANT
DRSG MEPILEX BORDER 4X4 (GAUZE/BANDAGES/DRESSINGS) ×2 IMPLANT
DRSG MEPILEX BORDER 4X8 (GAUZE/BANDAGES/DRESSINGS) ×3 IMPLANT
DRSG PAD ABDOMINAL 8X10 ST (GAUZE/BANDAGES/DRESSINGS) ×6 IMPLANT
DURAPREP 26ML APPLICATOR (WOUND CARE) ×3 IMPLANT
ELECT REM PT RETURN 9FT ADLT (ELECTROSURGICAL) ×3
ELECTRODE REM PT RTRN 9FT ADLT (ELECTROSURGICAL) ×1 IMPLANT
FACESHIELD WRAPAROUND (MASK) IMPLANT
FACESHIELD WRAPAROUND OR TEAM (MASK) ×1 IMPLANT
GAUZE XEROFORM 1X8 LF (GAUZE/BANDAGES/DRESSINGS) ×2 IMPLANT
GAUZE XEROFORM 5X9 LF (GAUZE/BANDAGES/DRESSINGS) ×1 IMPLANT
GLOVE BIO SURGEON STRL SZ8 (GLOVE) ×3 IMPLANT
GLOVE BIOGEL PI IND STRL 6.5 (GLOVE) IMPLANT
GLOVE BIOGEL PI IND STRL 8 (GLOVE) ×1 IMPLANT
GLOVE BIOGEL PI INDICATOR 6.5 (GLOVE) ×4
GLOVE BIOGEL PI INDICATOR 8 (GLOVE) ×2
GLOVE ORTHO TXT STRL SZ7.5 (GLOVE) ×3 IMPLANT
GLOVE ORTHOPEDIC STR SZ6.5 (GLOVE) ×4 IMPLANT
GOWN STRL REUS W/ TWL LRG LVL3 (GOWN DISPOSABLE) ×1 IMPLANT
GOWN STRL REUS W/ TWL XL LVL3 (GOWN DISPOSABLE) ×2 IMPLANT
GOWN STRL REUS W/TWL LRG LVL3 (GOWN DISPOSABLE) ×3
GOWN STRL REUS W/TWL XL LVL3 (GOWN DISPOSABLE) ×6
GUIDE PIN 3.2 LONG (PIN) ×2 IMPLANT
GUIDE PIN 3.2MM (MISCELLANEOUS) ×3
GUIDE PIN ORTH 343X3.2XBRAD (MISCELLANEOUS) IMPLANT
HIP SCREW SET (Screw) ×2 IMPLANT
KIT BASIN OR (CUSTOM PROCEDURE TRAY) ×3 IMPLANT
KIT ROOM TURNOVER OR (KITS) ×3 IMPLANT
LINER BOOT UNIVERSAL DISP (MISCELLANEOUS) ×1 IMPLANT
MANIFOLD NEPTUNE II (INSTRUMENTS) ×1 IMPLANT
NAIL IMHS 10X36 R (Nail) ×2 IMPLANT
NS IRRIG 1000ML POUR BTL (IV SOLUTION) ×3 IMPLANT
PACK GENERAL/GYN (CUSTOM PROCEDURE TRAY) ×3 IMPLANT
PAD ARMBOARD 7.5X6 YLW CONV (MISCELLANEOUS) ×6 IMPLANT
PAD CAST 4YDX4 CTTN HI CHSV (CAST SUPPLIES) ×2 IMPLANT
PADDING CAST COTTON 4X4 STRL (CAST SUPPLIES) ×6
SCREW LAG 90MM (Screw) ×3 IMPLANT
SCREW LAGSTD 90X21X12.7X9 (Screw) IMPLANT
STAPLER VISISTAT 35W (STAPLE) ×3 IMPLANT
SUT VIC AB 0 CT1 27 (SUTURE) ×6
SUT VIC AB 0 CT1 27XBRD ANBCTR (SUTURE) ×2 IMPLANT
SUT VIC AB 2-0 CT1 27 (SUTURE) ×6
SUT VIC AB 2-0 CT1 TAPERPNT 27 (SUTURE) ×2 IMPLANT
TOWEL OR 17X24 6PK STRL BLUE (TOWEL DISPOSABLE) ×3 IMPLANT
TOWEL OR 17X26 10 PK STRL BLUE (TOWEL DISPOSABLE) ×3 IMPLANT
WATER STERILE IRR 1000ML POUR (IV SOLUTION) ×1 IMPLANT

## 2014-05-10 NOTE — Anesthesia Preprocedure Evaluation (Addendum)
Anesthesia Evaluation  Patient identified by MRN, date of birth, ID band Patient awake    Reviewed: Allergy & Precautions, H&P , NPO status , Patient's Chart, lab work & pertinent test results  Airway Mallampati: I TM Distance: >3 FB Neck ROM: Full    Dental  (+) Teeth Intact, Dental Advisory Given   Pulmonary former smoker,  breath sounds clear to auscultation        Cardiovascular hypertension, Pt. on medications + CAD Rhythm:Regular Rate:Normal     Neuro/Psych    GI/Hepatic GERD-  Medicated and Controlled,  Endo/Other    Renal/GU      Musculoskeletal   Abdominal   Peds  Hematology   Anesthesia Other Findings   Reproductive/Obstetrics                          Anesthesia Physical Anesthesia Plan  ASA: IV  Anesthesia Plan: General   Post-op Pain Management:    Induction: Intravenous  Airway Management Planned: Oral ETT  Additional Equipment:   Intra-op Plan:   Post-operative Plan: Extubation in OR  Informed Consent: I have reviewed the patients History and Physical, chart, labs and discussed the procedure including the risks, benefits and alternatives for the proposed anesthesia with the patient or authorized representative who has indicated his/her understanding and acceptance.   Dental advisory given  Plan Discussed with: CRNA, Anesthesiologist and Surgeon  Anesthesia Plan Comments:         Anesthesia Quick Evaluation

## 2014-05-10 NOTE — Anesthesia Postprocedure Evaluation (Signed)
  Anesthesia Post-op Note  Patient: Clayton [name]  Procedure(s) Performed: Procedure(s): Open Reduction Internal Fixation Right Hip (Right)  Patient Location: PACU  Anesthesia Type:General  Level of Consciousness: awake and sedated  Airway and Oxygen Therapy: Patient Spontanous Breathing  Post-op Pain: mild  Post-op Assessment: Post-op Vital signs reviewed and Patient's Cardiovascular Status Stable  Post-op Vital Signs: stable  Last Vitals:  Filed Vitals:   05/10/14 2030  BP:   Pulse: 91  Temp: 36.4 C  Resp: 17    Complications: No apparent anesthesia complications

## 2014-05-10 NOTE — Transfer of Care (Signed)
Immediate Anesthesia Transfer of Care Note  Patient: Clayton [name]  Procedure(s) Performed: Procedure(s): Open Reduction Internal Fixation Right Hip (Right)  Patient Location: PACU  Anesthesia Type:General  Level of Consciousness: awake and alert   Airway & Oxygen Therapy: Patient Spontanous Breathing and Patient connected to nasal cannula oxygen  Post-op Assessment: Report given to PACU RN and Post -op Vital signs reviewed and stable  Post vital signs: Reviewed and stable  Complications: No apparent anesthesia complications

## 2014-05-10 NOTE — Progress Notes (Signed)
PROGRESS NOTE  Clayton [name] FWY:[id number] DOB: 05-31-1926 DOA: 05/06/2014 PCP: [name], MD  Brief history  80 y.o. male with PMH of HTN, HLD, CAD s/p CABG, s/p AVR, legally blind presented with mechanical fall and found to have trochanteric fracture R; Patient fell last night while walking to bathroom nto using his cane/walker. No LOC. As part of the work up for hip, CT of the right hip revealed new finding of crescentic sclerosis concerning for malignancy, therefore raising the concern of a pathologic fracture. MRI of the hip was obtained which did not reveal extension of fracture to greater trochanter. Skeletal survey was negative, but NM bone scan was positive for mets. Orthopedics has been consulted.   Assessment/Plan: Less trochanteric avulsion fracture of the right hip  -Concerned about possibility of pathologic fracture with new crescentic sclerosis noted on CT of the hip.  For OR this evening  Crescentic sclerosis/Abnormal Bone Scan/Metstatic Prostate Cancer -Associated with the avulsed fragment  -Skeletal survey negative for metastatic or lytic lesions  -MRI right hip--no extension of fracture to greater trochanter -Nuclear medicine bone scan-positive uptake in the anterior inferior iliiac spine, Right scapular, T10--concerning for met0static disease -PSA--70.2 (in the office had been stable for years at 84) -Urology agrees likely prostate with mets.  Needs tissue diagnosis from surgery -pt already has outpt MedOnc appt with Dr. Juliann Mule on 05/21/14@130pm   Dementia without behavioral disturbance: Stable so far.  No sundowning.  Continue Aricept  Glaucoma: Continue drops.  Stable.  Status post aortic valve replacement  -If the patient needs surgical intervention, he would need to be bridged with heparin drip, start after surgery  Acute on chronic renal failure (CKD stage III)  -Baseline creatinine 1.2-1.4  -Clinically appears hypovolemic  -Discontinue lisinopril  IV  fluids and blood, has improved creatinine. 1.24 on 7/20  Normocytic anemia  -Patient has previous history of B12 deficiency and had previously received B12 injections  Iron studies unrevealing, likely from chronic disease Noted drop since admission.  Patient received blood on 7/1. Hemoglobin on 7/28.5  Coronary artery disease with hx CABG  -Continue aspirin and metoprolol tartrate  -Adjust metoprolol to 25 mg twice a day   Family Communication: Spoke with wife at the bedside  Disposition Plan: Likely SNF    Procedures/Studies: Surgery Pending  Subjective: Patient tired, denies any pain.  No SOB.  Objective: Filed Vitals:   05/10/14 0504 05/10/14 0610 05/10/14 0815 05/10/14 1330  BP: 104/37 145/52  138/70  Pulse: 82 80  76  Temp: 98.8 F (37.1 C) 98.8 F (37.1 C)  98 F (36.7 C)  TempSrc: Oral Oral  Oral  Resp: 18 16  16   Height:   5\' 8"  (1.727 m)   Weight:   83.915 kg (185 lb)   SpO2: 96% 97%  98%    Intake/Output Summary (Last 24 hours) at 05/10/14 1724 Last data filed at 05/10/14 1332  Gross per 24 hour  Intake  192.5 ml  Output    750 ml  Net -557.5 ml   Weight change:  Exam:   General: Asleep, no acute distress  Cardiovascular: RRR, Y7/X4, 2/6 systolic ejection murmur  Respiratory: Clear to auscultation bilaterally  Abdomen: Soft/+BS, non tender, non distended, Extremities: 1+LE edema, no clubbing or cyanosis, Data Reviewed: Basic Metabolic Panel:  Recent Labs Lab 05/06/14 1216 05/07/14 0448 05/08/14 0940 05/10/14 0945  NA 141 142 143 140  K 4.2 4.4 4.1 3.8  CL 104  105 106 104  CO2 25 27 25 25   GLUCOSE 151* 124* 111* 128*  BUN 24* 32* 35* 24*  CREATININE 1.50* 1.90* 1.70* 1.24  CALCIUM 8.1* 8.1* 7.8* 7.6*   Liver Function Tests:  Recent Labs Lab 05/07/14 0448 05/08/14 0940  AST 15 14  ALT 10 9  ALKPHOS 145* 136*  BILITOT 0.6 0.8  PROT 5.8* 5.6*  ALBUMIN 2.7* 2.5*   CBC:  Recent Labs Lab 05/06/14 1216 05/07/14 0448  05/08/14 0940 05/09/14 1515 05/10/14 0945  WBC 12.7* 11.7* 12.5* 11.8* 10.0  HGB 9.1* 8.5* 7.8* 7.0* 8.5*  HCT 28.5* 26.9* 24.1* 21.8* 26.3*  MCV 92.5 94.7 93.8 90.8 90.7  PLT 205 175 157 189 159     Scheduled Meds: . aspirin EC  81 mg Oral Daily  . cyanocobalamin  1,000 mcg Intramuscular Q30 days  . donepezil  5 mg Oral QHS  . dorzolamide-timolol  1 drop Both Eyes BID  . latanoprost  1 drop Both Eyes QHS  . metoprolol  25 mg Oral BID  . multivitamin-lutein  1 capsule Oral BID  . pantoprazole  40 mg Oral Daily  . tamsulosin  0.4 mg Oral QHS   Continuous Infusions: . sodium chloride 75 mL/hr at 05/07/14 1222     Annita Brod, MD [facility] Pager [phone]  If 7PM-7AM, please contact night-coverage [email or web address] Password [email or web address] 05/10/2014, 5:24 PM   LOS: 4 days

## 2014-05-10 NOTE — Progress Notes (Signed)
ANTICOAGULATION CONSULT NOTE - Follow Up Consult  Pharmacy Consult for Heparin and Coumadin Indication: mechanical AVR  Allergies  Allergen Reactions  . Tetanus Toxoids Hives    Patient Measurements: Height: 5\' 8"  (172.7 cm) Weight: 185 lb (83.915 kg) IBW/kg (Calculated) : 68.4 Heparin Dosing Weight: 84 kg  Vital Signs: Temp: 97.6 F (36.4 C) (07/02 2047) Temp src: Oral (07/02 2047) BP: 179/81 mmHg (07/02 2047) Pulse Rate: 95 (07/02 2047)  Labs:  Recent Labs  05/08/14 0940 05/09/14 0610 05/09/14 1515 05/10/14 0945  HGB 7.8*  --  7.0* 8.5*  HCT 24.1*  --  21.8* 26.3*  PLT 157  --  189 159  LABPROT 35.1* 27.4*  --  19.0*  INR 3.50* 2.55*  --  1.59*  CREATININE 1.70*  --   --  1.24    Estimated Creatinine Clearance: 46 ml/min (by C-G formula based on Cr of 1.24).  Assessment:  s/p ORIF right hip for impending pathologic fracture.  Coumadin reversed with Vitamin K 10 mg PO on 6/30 (INR 3.5) + 5 mg PO on 7/1 (INR 2.55)  INR down to 1.59 today, to resume Coumadin tonight post-op.  Additional orders for Pharmacy to begin IV heparin post-op.  Spoke with Dr. Lorin Mercy (on call) about timing of beginning IV heparin.  Just out of surgery ~8pm.   Home Coumadin regimen: 6 mg daily except 7 mg on Sundays and Thursdays.  Goal of Therapy:  Heparin level 0.3-0.7 units/ml INR 2.5-3.5 Monitor platelets by anticoagulation protocol: Yes   Plan:   Lovenox 40 mg x 1 tonight ~ 44mn per d/w Dr. Lorin Mercy.  Dr. Ninfa Linden to indicate when to begin IV heparin on 7/3.  Coumadin 7.5 mg x 1 tonight.  Discussed with patient.  Daily PT/INR.  Daily CBC and heparin levels when on IV heparin.  Arty Baumgartner, [place] Pager: [phone] 05/10/2014,9:55 PM

## 2014-05-10 NOTE — Progress Notes (Signed)
Patient ID: Clayton [name], male   DOB: 01/31/1926, 80 y.o.   MRN: [id number] His labs including H/H and INR all look better from a surgical standpoint.  Again, I have talked with him and his wife.  We will proceed to the OR this evening to address his right hip impending pathologic fracture.

## 2014-05-10 NOTE — Progress Notes (Signed)
Orthopedic Tech Progress Note Patient Details:  Clayton [name] 1926/04/13 [id number]  Ortho Devices Ortho Device/Splint Location: put ohf on bed Ortho Device/Splint Interventions: Ordered;Application   [name] 05/10/2014, 8:26 PM

## 2014-05-10 NOTE — Anesthesia Procedure Notes (Signed)
Procedure Name: Intubation Date/Time: 05/10/2014 6:39 PM Performed by: Eligha Bridegroom Pre-anesthesia Checklist: Patient identified, Timeout performed, Emergency Drugs available, Suction available and Patient being monitored Patient Re-evaluated:Patient Re-evaluated prior to inductionOxygen Delivery Method: Circle system utilized Preoxygenation: Pre-oxygenation with 100% oxygen Intubation Type: IV induction Ventilation: Mask ventilation without difficulty Laryngoscope Size: Mac and 4 Grade View: Grade I Tube type: Oral Tube size: 7.5 mm Number of attempts: 1 Airway Equipment and Method: Stylet Placement Confirmation: ETT inserted through vocal cords under direct vision,  breath sounds checked- equal and bilateral and positive ETCO2 Secured at: 22 cm Tube secured with: Tape Dental Injury: Teeth and Oropharynx as per pre-operative assessment

## 2014-05-10 NOTE — Progress Notes (Signed)
OT Cancellation Note  Patient Details Name: Clayton [name] MRN: [id number] DOB: [birth year]   Cancelled Treatment:    Reason Eval/Treat Not Completed: Other (comment). Pt to have surgery today or tomorrow. We will wait until after the surgery to see patient.  Almon Register [phone] 05/10/2014, 8:16 AM

## 2014-05-10 NOTE — Brief Op Note (Signed)
05/06/2014 - 05/10/2014  7:49 PM  PATIENT:  Clayton [name]  80 y.o. male  PRE-OPERATIVE DIAGNOSIS:  Impending pathologic fracture right hip  POST-OPERATIVE DIAGNOSIS:  Impending pathologic fracture right hip  PROCEDURE:  Procedure(s): Open Reduction Internal Fixation Right Hip (Right)  SURGEON:  Surgeon(s) and Role:    * Mcarthur Rossetti, MD - Primary  ASSISTANTS: Shary Decamp, PAS2   ANESTHESIA:   general  EBL:  Total I/O In: 350 [Blood:350] Out: -   BLOOD ADMINISTERED:500 CC PRBC  DRAINS: none   LOCAL MEDICATIONS USED:  NONE  SPECIMEN:  Source of Specimen:  right proximal femur reamings  DISPOSITION OF SPECIMEN:  PATHOLOGY  COUNTS:  YES  TOURNIQUET:  * No tourniquets in log *  DICTATION: .Other Dictation: Dictation Number [id number]  PLAN OF CARE: Admit to inpatient   PATIENT DISPOSITION:  PACU - hemodynamically stable.   Delay start of Pharmacological VTE agent (>24hrs) due to surgical blood loss or risk of bleeding: no

## 2014-05-11 DIAGNOSIS — C7951 Secondary malignant neoplasm of bone: Secondary | ICD-10-CM | POA: Diagnosis present

## 2014-05-11 LAB — BASIC METABOLIC PANEL
ANION GAP: 10 (ref 5–15)
BUN: 20 mg/dL (ref 6–23)
CALCIUM: 7.6 mg/dL — AB (ref 8.4–10.5)
CO2: 26 meq/L (ref 19–32)
Chloride: 104 mEq/L (ref 96–112)
Creatinine, Ser: 1.15 mg/dL (ref 0.50–1.35)
GFR calc Af Amer: 65 mL/min — ABNORMAL LOW (ref >90)
GFR calc non Af Amer: 56 mL/min — ABNORMAL LOW (ref >90)
GLUCOSE: 127 mg/dL — AB (ref 70–99)
POTASSIUM: 4.8 meq/L (ref 3.7–5.3)
Sodium: 140 mEq/L (ref 137–147)

## 2014-05-11 LAB — CBC
HCT: 28.4 % — ABNORMAL LOW (ref 39.0–52.0)
HEMOGLOBIN: 9.3 g/dL — AB (ref 13.0–17.0)
MCH: 29.5 pg (ref 26.0–34.0)
MCHC: 32.7 g/dL (ref 30.0–36.0)
MCV: 90.2 fL (ref 78.0–100.0)
Platelets: 155 10*3/uL (ref 150–400)
RBC: 3.15 MIL/uL — AB (ref 4.22–5.81)
RDW: 15.1 % (ref 11.5–15.5)
WBC: 12.4 10*3/uL — ABNORMAL HIGH (ref 4.0–10.5)

## 2014-05-11 LAB — HEPARIN LEVEL (UNFRACTIONATED): Heparin Unfractionated: 0.2 IU/mL — ABNORMAL LOW (ref 0.30–0.70)

## 2014-05-11 MED ORDER — WARFARIN SODIUM 7.5 MG PO TABS
7.5000 mg | ORAL_TABLET | Freq: Once | ORAL | Status: AC
  Administered 2014-05-11: 7.5 mg via ORAL
  Filled 2014-05-11: qty 1

## 2014-05-11 MED ORDER — HEPARIN (PORCINE) IN NACL 100-0.45 UNIT/ML-% IJ SOLN
1200.0000 [IU]/h | INTRAMUSCULAR | Status: DC
  Administered 2014-05-11: 1000 [IU]/h via INTRAVENOUS
  Filled 2014-05-11 (×2): qty 250

## 2014-05-11 NOTE — Progress Notes (Signed)
Physical Therapy Re-Evaluation Patient Details Name: Clayton [name] MRN: [id number] DOB: 1926-06-08 Today's Date: 05/11/2014   History of Present Illness  Pt admitted 05/06/14 after a fall resulting in a right lesser trochanter avulsion type fracture. He underwent surgery 05/10/14 and is now s/p right ORIF of proximal femur with IM screw.  Hx of CAD, HTN, legally blind, and s/p CABG.   Clinical Impression  Patient is seen following the above procedure and presents with functional limitations due to the deficits listed below (see PT Problem List). Pt is progressing slowly towards PT goals and these goals have been appropriately updated. Patient will benefit from skilled PT to increase their independence and safety with mobility to allow discharge to the venue listed below.       Follow Up Recommendations SNF    Equipment Recommendations  Other (comment) (platform for walker)    Recommendations for Other Services       Precautions / Restrictions Precautions Precautions: Fall Restrictions Weight Bearing Restrictions: Yes RLE Weight Bearing: Weight bearing as tolerated      Mobility  Bed Mobility Overal bed mobility: Needs Assistance;+2 for physical assistance Bed Mobility: Supine to Sit     Supine to sit: Min assist;HOB elevated;+2 for safety/equipment     General bed mobility comments: Min assist for RLE support off of bed and HOB elevated. Some assist with trunk to assume seated position. VCs for technique.  Transfers Overall transfer level: Needs assistance Equipment used: Rolling walker (2 wheeled) Transfers: Sit to/from [facility] Sit to Stand: Min assist;+2 physical assistance Stand pivot transfers: Min assist       General transfer comment: Min assist +2 for sit<>stand from lowest bed setting for boost into upright position. VCs for hand placement. Min assist for pivot transfer with education on DME use and assist to position walker. Max verbal cues for  sequencing. Great difficulty stepping with left due to pain when WB on RLE.  Tactile cues for upright posture as pt leans heavily on Lt elbow during transfer.  Ambulation/Gait                Stairs            Wheelchair Mobility    Modified Rankin (Stroke Patients Only)       Balance Overall balance assessment: Needs assistance Sitting-balance support: Single extremity supported;Feet supported Sitting balance-Leahy Scale: Poor     Standing balance support: Bilateral upper extremity supported Standing balance-Leahy Scale: Poor                               Pertinent Vitals/Pain Pt reports pain as low  Nurse aware Patient repositioned in chair for comfort.     Home Living Family/patient expects to be discharged to:: Skilled nursing facility Living Arrangements: Spouse/significant other Available Help at Discharge: [place] Type of Home: House Home Access: Stairs to enter   [employer] of Steps: 3 Home Layout: One level Home Equipment: [place] - single point;Walker - 2 wheels;Walker - 4 wheels      Prior Function Level of Independence: Needs assistance   Gait / Transfers Assistance Needed: pt occasionally used a cane for mobility, but wife states he didn't always use it.    ADL's / Homemaking Assistance Needed: Wife performs most homemaking tasks.  Pt was able to perform ADLs         Hand Dominance   Dominant Hand: Right  Extremity/Trunk Assessment   Upper Extremity Assessment: Defer to OT evaluation           Lower Extremity Assessment: RLE deficits/detail RLE Deficits / Details: decreased strength and ROM - as expected post op        Communication   Communication: HOH  Cognition Arousal/Alertness: Awake/alert Behavior During Therapy: WFL for tasks assessed/performed Overall Cognitive Status: Within Functional Limits for tasks assessed                      General Comments General  comments (skin integrity, edema, etc.): Adjusted walker with platform for LUE as pt was leaning on left elbow during transfer. This should assist with upright posture to increase WB through RLE.    Exercises        Assessment/Plan    PT Assessment Patient needs continued PT services  PT Diagnosis Difficulty walking;Acute pain   PT Problem List Decreased strength;Decreased activity tolerance;Decreased balance;Decreased mobility;Decreased knowledge of use of DME;Pain;Decreased range of motion  PT Treatment Interventions DME instruction;Gait training;Functional mobility training;Therapeutic activities;Therapeutic exercise;Balance training;Neuromuscular re-education;Patient/family education;Modalities   PT Goals (Current goals can be found in the Care Plan section) Acute Rehab PT Goals Patient Stated Goal: None stated.   PT Goal Formulation: With patient Time For Goal Achievement: 05/18/14 Potential to Achieve Goals: Good    Frequency Min 3X/week   Barriers to discharge Decreased caregiver support Pt will not have adequte care for d/c to home.    Co-evaluation               End of Session Equipment Utilized During Treatment: Gait belt Activity Tolerance: Patient tolerated treatment well Patient left: in chair;with call bell/phone within reach;with family/visitor present Nurse Communication: Mobility status         Time: 8127-5170 PT Time Calculation (min): 25 min   Charges:   PT Evaluation $PT Re-evaluation: 1 Procedure PT Treatments $Therapeutic Activity: 8-22 mins   PT G Codes:        Elayne Snare, [place]    Ellouise Newer 05/11/2014, 9:56 AM

## 2014-05-11 NOTE — Progress Notes (Signed)
Pharmacy called and informed RN to change patients heparin rate to 12 ml/hr.

## 2014-05-11 NOTE — Care Management Note (Signed)
CARE MANAGEMENT NOTE 05/11/2014  Patient:  Clayton [name], Clayton [name]   Account Number:  [id number]  Date Initiated:  05/11/2014  Documentation initiated by:  Ricki Miller  Subjective/Objective Assessment:   80 yr old male s/p right hip fracture with IM Nailing     Action/Plan:   PAtient is for shortterm rehab at [facility]. Will go to [place]. Social worker is aware.   Anticipated DC Date:  05/12/2014   Anticipated DC Plan:  SKILLED NURSING FACILITY  In-house referral  Clinical Social Worker      DC Planning Services  CM consult      [facility] [place] Choice  NA   Choice offered to / List presented to:     DME arranged  NA      DME agency  NA        Status of service:  Completed, signed off Medicare Important Message given?  YES (If response is "NO", the following Medicare IM given date fields will be blank) Date Medicare IM given:  05/06/2014 Medicare IM given by:   Date Additional Medicare IM given:   Additional Medicare IM given by:    Discharge Disposition:  [place]  Per UR Regulation:  Reviewed for med. necessity/level of care/duration of stay

## 2014-05-11 NOTE — Op Note (Signed)
NAMEBRONCO, Clayton [name]                    ACCOUNT NO.:  [id number]  MEDICAL RECORD NO.:  [id number]  LOCATION:  5N12C                        FACILITY:  [facility]  PHYSICIAN:  Lind Guest. Ninfa Linden, M.D.DATE OF BIRTH:  [birth year]  DATE OF PROCEDURE:  05/10/2014 DATE OF DISCHARGE:                              OPERATIVE REPORT   PREOPERATIVE DIAGNOSIS:  Right hip pathologic lesser trochanteric fracture.  POSTOPERATIVE DIAGNOSIS:  Right hip pathologic lesser trochanteric fracture.  PROCEDURE:  Open reduction and internal fixation of left proximal femur fracture using intramedullary hip screw.  IMPLANTS:  Smith [employer] Nephew 10 mm x 36 cm intramedullary nail with a 90 mm lag screw.  FINDINGS:  Permanent pathology sent for identification and PSA staining.  SURGEON:  Lind Guest. Ninfa Linden, M.D.  ASSISTANT:  Shary Decamp, PA student II.  ANTIBIOTICS:  2 g IV Ancef.  BLOOD LOSS:  100 mL.  COMPLICATIONS:  None.  INDICATIONS:  Clayton [name] is an 80 year old gentleman who last weekend on Sunday sustained a mechanical fall.  He was seen at the [facility] and found to have a lesser trochanteric avulsion fracture.  Of more importance, he seemed to have some type of a lesion in the bone at the level of lesser trochanter.  This definitely created a stress riser which is concerning for letting him put any weight on his leg at all. He has had a 3-phase bone scan, a CT scan, and a MRI.  It appears this may be a metastatic-type of lesion.  He has been seen by Urology as well.  At this point, one of our recommendations is keeping him nonweightbearing for indeterminate amount of time.  He was having any difficulty with mobility and significant pain.  Due to the stress riser at the calcar area over the lesser trochanter has been pulled off, we recommended open reduction and internal fixation.  The hope further, we can also obtain a specimen for a pathologic identification.  PROCEDURE IN DETAIL:   After informed consent was obtained, appropriate right hip was marked.  He was brought into the operating room and general anesthesia was obtained while he was on the stretcher.  He was next placed supine on the fracture table with the perineal post in place.  The left non-operative hip in a abduction stirrup with appropriate padding in the popliteal area and the right operative leg in inline skeletal traction, but no traction to be applied.  We then assessed this fracture under fluoroscopy, so we could obtain our nail length with the nail and the sterile box, we chose a 10 mm x 36 cm length rod.  This was then opened up sterilely on the back table.  The right hip was then prepped and draped with DuraPrep and a sterile shower curtain drape.  Time-out was called to identify correct patient, correct right hip.  I then made an incision just proximal to the greater trochanter and carried this down to the tip of the greater trochanter. Under direct fluoroscopic guidance, I then placed a temporary guide pin from an antegrade fashion from greater trochanter down cutting it towards the lesser trochanter.  We then used initiating reamer to  open the femoral canal all the way down to this guide pin and passed off the reamings for pathologic identification.  We then placed the real 10 mm x 36 cm femoral nail in the antegrade fashion down the canal and had a nice tight fit.  Using the outrigger guide, we made a separate incision in the lateral femur and thigh.  We then placed a temporary guide pin within the lateral cortex traversing the femoral neck into the femoral head in appropriate position.  We took a measurement off of this and chose a 9 mm lag screw.  We drilled to the depth of 90 mm and then placed a 90 mm lag screw without difficulty.  We then removed all instrumentation and irrigated the 2 small wounds with normal saline solution.  We closed the deep tissue with 0 Vicryl followed by  2-0 Vicryl in subcutaneous tissue, interrupted staples on the skin. Xeroform and well-padded sterile dressing was applied.  He was taken off the fracture table, awakened, extubated, and taken to recovery room in stable condition.  All final counts were correct, and there were no complications noted.  Postoperatively, he will return to the floor bed and will hopefully be able to initiate therapy with full weightbearing tomorrow.     Lind Guest. Ninfa Linden, M.D.     CYB/MEDQ  D:  05/10/2014  T:  05/11/2014  Job:  [id number]

## 2014-05-11 NOTE — Clinical Social Work Note (Signed)
SNF bed confirmed for patient at [facility]- awaiting MD for anticipated dc date for further planning and insurance auth.  Eduard Clos, MSW, [place]

## 2014-05-11 NOTE — Progress Notes (Signed)
ANTICOAGULATION CONSULT NOTE - Follow Up Consult  Pharmacy Consult for Coumadin Indication: mechanical AVR  Allergies  Allergen Reactions  . Tetanus Toxoids Hives    Patient Measurements: Height: 5\' 8"  (172.7 cm) Weight: 185 lb (83.915 kg) IBW/kg (Calculated) : 68.4 Heparin Dosing Weight: 84 kg  Vital Signs: Temp: 98.2 F (36.8 C) (07/03 2106) Temp src: Oral (07/03 2106) BP: 123/54 mmHg (07/03 2106) Pulse Rate: 104 (07/03 2106)  Labs:  Recent Labs  05/09/14 0610  05/09/14 1515 05/10/14 0945 05/11/14 0550 05/11/14 2058  HGB  --   < > 7.0* 8.5* 9.3*  --   HCT  --   --  21.8* 26.3* 28.4*  --   PLT  --   --  189 159 155  --   LABPROT 27.4*  --   --  19.0*  --   --   INR 2.55*  --   --  1.59*  --   --   HEPARINUNFRC  --   --   --   --   --  0.20*  CREATININE  --   --   --  1.24 1.15  --   < > = values in this interval not displayed.  Estimated Creatinine Clearance: 49.6 ml/min (by C-G formula based on Cr of 1.15).  Assessment: s/p THA on chronic Coumadin for AVR Per discussion with Dr. Maryland Pink, the patient will be re-started on heparin drip due to h/o of AVR. Heparin level 0.2 < goal.   Goal of Therapy:  INR 2.5-3.5  Monitor platelets by anticoagulation protocol: Yes   Plan:  Increase IV heparin to 1200 units/hr Check heparin level in AM   Tiarna Koppen S. Alford Highland, PharmD, [place] Clinical Staff Pharmacist Pager [phone]

## 2014-05-11 NOTE — Progress Notes (Signed)
Subjective: 1 Day Post-Op Procedure(s) (LRB): Open Reduction Internal Fixation Right Hip (Right) Seems to have less right hip pain.  Awake.  follws commands, but weak.  Objective: Vital signs in last 24 hours: Temp:  [97.4 F (36.3 C)-98.1 F (36.7 C)] 97.4 F (36.3 C) (07/03 0422) Pulse Rate:  [76-100] 82 (07/03 0422) Resp:  [16-21] 20 (07/03 0422) BP: (138-179)/(61-81) 148/61 mmHg (07/03 0422) SpO2:  [96 %-100 %] 99 % (07/03 0422)  Intake/Output from previous day: 07/02 0701 - 07/03 0700 In: 2095 [P.O.:120; I.V.:1625; Blood:350] Out: 925 [Urine:925] Intake/Output this shift:     Recent Labs  05/09/14 1515 05/10/14 0945 05/11/14 0550  HGB 7.0* 8.5* 9.3*    Recent Labs  05/10/14 0945 05/11/14 0550  WBC 10.0 12.4*  RBC 2.90* 3.15*  HCT 26.3* 28.4*  PLT 159 155    Recent Labs  05/10/14 0945 05/11/14 0550  NA 140 140  K 3.8 4.8  CL 104 104  CO2 25 26  BUN 24* 20  CREATININE 1.24 1.15  GLUCOSE 128* 127*  CALCIUM 7.6* 7.6*    Recent Labs  05/09/14 0610 05/10/14 0945  INR 2.55* 1.59*    Sensation intact distally Intact pulses distally Dorsiflexion/Plantar flexion intact Incision: dressing C/D/I  Assessment/Plan: 1 Day Post-Op Procedure(s) (LRB): Open Reduction Internal Fixation Right Hip (Right) Up with therapy, WBAT right hip Will needs SNF placement. Coumadin resumed.  Clayton [name] 05/11/2014, 10:24 AM

## 2014-05-11 NOTE — Progress Notes (Addendum)
ANTICOAGULATION CONSULT NOTE - Follow Up Consult  Pharmacy Consult for Coumadin Indication: mechanical AVR  Allergies  Allergen Reactions  . Tetanus Toxoids Hives    Patient Measurements: Height: 5\' 8"  (172.7 cm) Weight: 185 lb (83.915 kg) IBW/kg (Calculated) : 68.4 Heparin Dosing Weight: 84 kg  Vital Signs: Temp: 97.4 F (36.3 C) (07/03 0422) Temp src: Oral (07/03 0422) BP: 148/61 mmHg (07/03 0422) Pulse Rate: 82 (07/03 0422)  Labs:  Recent Labs  05/09/14 0610  05/09/14 1515 05/10/14 0945 05/11/14 0550  HGB  --   < > 7.0* 8.5* 9.3*  HCT  --   --  21.8* 26.3* 28.4*  PLT  --   --  189 159 155  LABPROT 27.4*  --   --  19.0*  --   INR 2.55*  --   --  1.59*  --   CREATININE  --   --   --  1.24 1.15  < > = values in this interval not displayed.  Estimated Creatinine Clearance: 49.6 ml/min (by C-G formula based on Cr of 1.15).  Assessment:  s/p ORIF right hip for impending pathologic fracture. Patient received Coumadin 7.5 mg yesterday. INR today is sub-therapeutic at 1.59. Per ortho, patient does not need to be on heparin. Hgb continues to trend up after transfusion. Plt remain stable   Home Coumadin regimen: 6 mg daily except 7 mg on Sundays and Thursdays.  Goal of Therapy:  INR 2.5-3.5  Monitor platelets by anticoagulation protocol: Yes   Plan:  -Repeat Coumadin 7.5 mg x 1 dose tonight  -Daily PT/INR. Monitor for s/s of bleeding   -If INR does not trend up tomorrow, may need to increase Coumadin dose    Albertina Parr, PharmD.  Clinical Pharmacist Pager [phone]  Addendum: Per discussion with Dr. Maryland Pink, the patient will be re-started on heparin drip due to h/o of AVR.   Plan: -Start heparin infusion at 1000 units/hr. No bolus -F/u 6 hour heparin level -Monitor daily HL, CBC and s/s of bleeding  Albertina Parr, PharmD.  Clinical Pharmacist Pager [phone]

## 2014-05-11 NOTE — Progress Notes (Signed)
PROGRESS NOTE  Clayton [name] MOQ:[id number] DOB: Aug 01, 1926 DOA: 05/06/2014 PCP: [name], MD  Brief history  80 y.o. male with PMH of HTN, HLD, CAD s/p CABG, s/p AVR, legally blind presented with mechanical fall and found to have trochanteric fracture R; Patient fell last night while walking to bathroom nto using his cane/walker. No LOC. As part of the work up for hip, CT of the right hip revealed new finding of crescentic sclerosis concerning for malignancy, therefore raising the concern of a pathologic fracture. MRI of the hip was obtained which did not reveal extension of fracture to greater trochanter. Skeletal survey was negative, but NM bone scan was positive for mets. Orthopedics has been consulted.   Assessment/Plan: Less trochanteric avulsion fracture of the right hip  -Concerned about possibility of pathologic fracture with new crescentic sclerosis noted on CT of the hip.  S/p R ORIF.  Stable.  For SNF, likely Monday  Crescentic sclerosis/Abnormal Bone Scan/Metstatic Prostate Cancer -Associated with the avulsed fragment  -Skeletal survey negative for metastatic or lytic lesions  -MRI right hip--no extension of fracture to greater trochanter -Nuclear medicine bone scan-positive uptake in the anterior inferior iliiac spine, Right scapular, T10--concerning for met0static disease -PSA--70.2 (in the office had been stable for years at 37) -Urology agrees likely prostate with mets.  Needs tissue diagnosis from surgery -pt already has outpt MedOnc appt with Dr. Juliann Mule on 05/21/14@130pm   Dementia without behavioral disturbance: Stable so far.  No sundowning.  Continue Aricept  Glaucoma: Continue drops.  Stable.  Status post aortic valve replacement  -Restarted coumadin.  Confirming with Ortho safety in resuming heparin  Acute on chronic renal failure (CKD stage III)  -Baseline creatinine 1.2-1.4  -Clinically appears hypovolemic  -Discontinue lisinopril  IV fluids and blood,  has improved, at baseline since 7/1  Normocytic anemia  -Patient has previous history of B12 deficiency and had previously received B12 injections  Iron studies unrevealing, likely from chronic disease Noted drop since admission.  Patient received blood on 7/1. Hgb up to 9.3 on 7/3.  Expect some drop in next few days from acute blood loss from surgery  Coronary artery disease with hx CABG  -Continue aspirin and metoprolol tartrate  -Adjust metoprolol to 25 mg twice a day   Family Communication: Spoke with wife at the bedside  Disposition Plan: Likely SNF    Procedures/Studies: Surgery Pending  Subjective: Pt doing ok.  No SOB or cough.  Not much appetite.  Wife noted choking episode earlier and episode of vomiting.    Objective: Filed Vitals:   05/10/14 2344 05/11/14 0059 05/11/14 0422 05/11/14 1408  BP: 146/64 140/63 148/61 134/59  Pulse: 100 100 82 87  Temp: 98.1 F (36.7 C) 97.9 F (36.6 C) 97.4 F (36.3 C) 97.4 F (36.3 C)  TempSrc: Oral Oral Oral Oral  Resp: 18 20 20 16   Height:      Weight:      SpO2: 97% 100% 99% 97%    Intake/Output Summary (Last 24 hours) at 05/11/14 1410 Last data filed at 05/11/14 1409  Gross per 24 hour  Intake   1975 ml  Output    675 ml  Net   1300 ml   Weight change:  Exam:   General: Fatigued, NAD, alert & oriented x 2  Cardiovascular: RRR, P5/W6, 2/6 systolic ejection murmur  Respiratory: Clear to auscultation bilaterally  Abdomen: Soft/+BS, non tender, non distended, Extremities: 1+LE edema, no clubbing  or cyanosis, Data Reviewed: Basic Metabolic Panel:  Recent Labs Lab 05/06/14 1216 05/07/14 0448 05/08/14 0940 05/10/14 0945 05/11/14 0550  NA 141 142 143 140 140  K 4.2 4.4 4.1 3.8 4.8  CL 104 105 106 104 104  CO2 25 27 25 25 26   GLUCOSE 151* 124* 111* 128* 127*  BUN 24* 32* 35* 24* 20  CREATININE 1.50* 1.90* 1.70* 1.24 1.15  CALCIUM 8.1* 8.1* 7.8* 7.6* 7.6*   Liver Function Tests:  Recent Labs Lab  05/07/14 0448 05/08/14 0940  AST 15 14  ALT 10 9  ALKPHOS 145* 136*  BILITOT 0.6 0.8  PROT 5.8* 5.6*  ALBUMIN 2.7* 2.5*   CBC:  Recent Labs Lab 05/07/14 0448 05/08/14 0940 05/09/14 1515 05/10/14 0945 05/11/14 0550  WBC 11.7* 12.5* 11.8* 10.0 12.4*  HGB 8.5* 7.8* 7.0* 8.5* 9.3*  HCT 26.9* 24.1* 21.8* 26.3* 28.4*  MCV 94.7 93.8 90.8 90.7 90.2  PLT 175 157 189 159 155     Scheduled Meds: . aspirin EC  81 mg Oral Daily  . cyanocobalamin  1,000 mcg Intramuscular Q30 days  . docusate sodium  100 mg Oral BID  . donepezil  5 mg Oral QHS  . dorzolamide-timolol  1 drop Both Eyes BID  . latanoprost  1 drop Both Eyes QHS  . metoprolol  25 mg Oral BID  . multivitamin-lutein  1 capsule Oral BID  . pantoprazole  40 mg Oral Daily  . tamsulosin  0.4 mg Oral QHS  . warfarin  7.5 mg Oral ONCE-1800  . Warfarin - Pharmacist Dosing Inpatient   Does not apply q1800   Continuous Infusions: . sodium chloride 75 mL/hr at 05/07/14 1222  . sodium chloride    . heparin       Annita Brod, MD [facility] Pager [phone]  If 7PM-7AM, please contact night-coverage [email or web address] Password [email or web address] 05/11/2014, 2:10 PM   LOS: 5 days

## 2014-05-12 DIAGNOSIS — I5032 Chronic diastolic (congestive) heart failure: Secondary | ICD-10-CM | POA: Insufficient documentation

## 2014-05-12 LAB — BASIC METABOLIC PANEL
ANION GAP: 14 (ref 5–15)
BUN: 18 mg/dL (ref 6–23)
CHLORIDE: 106 meq/L (ref 96–112)
CO2: 19 meq/L (ref 19–32)
Calcium: 6.4 mg/dL — CL (ref 8.4–10.5)
Creatinine, Ser: 0.9 mg/dL (ref 0.50–1.35)
GFR calc Af Amer: 87 mL/min — ABNORMAL LOW (ref >90)
GFR calc non Af Amer: 75 mL/min — ABNORMAL LOW (ref >90)
Glucose, Bld: 103 mg/dL — ABNORMAL HIGH (ref 70–99)
Potassium: 3.4 mEq/L — ABNORMAL LOW (ref 3.7–5.3)
Sodium: 139 mEq/L (ref 137–147)

## 2014-05-12 LAB — CBC
HCT: 24.4 % — ABNORMAL LOW (ref 39.0–52.0)
HEMOGLOBIN: 8.1 g/dL — AB (ref 13.0–17.0)
MCH: 30.1 pg (ref 26.0–34.0)
MCHC: 33.2 g/dL (ref 30.0–36.0)
MCV: 90.7 fL (ref 78.0–100.0)
PLATELETS: 138 10*3/uL — AB (ref 150–400)
RBC: 2.69 MIL/uL — AB (ref 4.22–5.81)
RDW: 15.2 % (ref 11.5–15.5)
WBC: 9.2 10*3/uL (ref 4.0–10.5)

## 2014-05-12 LAB — PROTIME-INR
INR: 2.69 — ABNORMAL HIGH (ref 0.00–1.49)
Prothrombin Time: 28.6 seconds — ABNORMAL HIGH (ref 11.6–15.2)

## 2014-05-12 LAB — PRO B NATRIURETIC PEPTIDE: PRO B NATRI PEPTIDE: 2081 pg/mL — AB (ref 0–450)

## 2014-05-12 LAB — HEPARIN LEVEL (UNFRACTIONATED)

## 2014-05-12 MED ORDER — POLYETHYLENE GLYCOL 3350 17 G PO PACK
17.0000 g | PACK | Freq: Every day | ORAL | Status: DC
  Administered 2014-05-12 – 2014-05-14 (×3): 17 g via ORAL
  Filled 2014-05-12 (×3): qty 1

## 2014-05-12 MED ORDER — WARFARIN SODIUM 6 MG PO TABS
6.0000 mg | ORAL_TABLET | Freq: Once | ORAL | Status: AC
  Administered 2014-05-12: 6 mg via ORAL
  Filled 2014-05-12: qty 1

## 2014-05-12 MED ORDER — FUROSEMIDE 10 MG/ML IJ SOLN
20.0000 mg | Freq: Two times a day (BID) | INTRAMUSCULAR | Status: DC
  Administered 2014-05-12 – 2014-05-14 (×4): 20 mg via INTRAVENOUS
  Filled 2014-05-12 (×7): qty 2

## 2014-05-12 MED ORDER — SODIUM CHLORIDE 0.9 % IV SOLN
1.0000 g | Freq: Once | INTRAVENOUS | Status: AC
  Administered 2014-05-12: 1 g via INTRAVENOUS
  Filled 2014-05-12: qty 10

## 2014-05-12 NOTE — Progress Notes (Signed)
CRITICAL VALUE ALERT  Critical value received:  6.4 calcium  Date of notification:  05/12/2014  Time of notification:  0637  Critical value read back:Yes.     Nurse who received alert:  Durene Fruits  MD notified (1st page):  [facility] text page  Time of first page:  [phone]  MD notified (2nd page):  Time of second page:  Responding MD:  Dr. Rogue Bussing  Time MD responded:  [phone]

## 2014-05-12 NOTE — Progress Notes (Signed)
ANTICOAGULATION CONSULT NOTE - Follow Up Consult  Pharmacy Consult for heparin Indication: AVR   Labs:  Recent Labs  05/10/14 0945 05/11/14 0550 05/11/14 2058 05/12/14 0437 05/12/14 0500  HGB 8.5* 9.3*  --  8.1*  --   HCT 26.3* 28.4*  --  24.4*  --   PLT 159 155  --  138*  --   LABPROT 19.0*  --   --  28.6*  --   INR 1.59*  --   --  2.69*  --   HEPARINUNFRC  --   --  0.20*  --  >2.20*  CREATININE 1.24 1.15  --   --   --     Assessment/Plan:  80yo male now supratherapeutic on heparin after rate increase; drastic level change is surprising but RN reports that lab was drawn from foot so should be accurate.  INR now therapeutic after one INR below goal.  Will hold heparin for now and f/u w/ am rounds whether pt should continue heparin.  Wynona Neat, PharmD, BCPS  05/12/2014,6:28 AM

## 2014-05-12 NOTE — Progress Notes (Signed)
Subjective: 2 Days Post-Op Procedure(s) (LRB): Open Reduction Internal Fixation Right Hip (Right) Patient reports pain as moderate.  Some improvement with surgery.  Still with acute on chronic anemia, both from surgery and from chronic disease as well as being on blood thinners.  Objective: Vital signs in last 24 hours: Temp:  [97.4 F (36.3 C)-98.2 F (36.8 C)] 98 F (36.7 C) (07/04 0522) Pulse Rate:  [87-116] 116 (07/04 0522) Resp:  [16-20] 20 (07/04 0522) BP: (123-166)/(54-76) 166/76 mmHg (07/04 0522) SpO2:  [97 %-100 %] 98 % (07/04 0522)  Intake/Output from previous day: 07/03 0701 - 07/04 0700 In: -  Out: 250 [Urine:250] Intake/Output this shift:     Recent Labs  05/09/14 1515 05/10/14 0945 05/11/14 0550 05/12/14 0437  HGB 7.0* 8.5* 9.3* 8.1*    Recent Labs  05/11/14 0550 05/12/14 0437  WBC 12.4* 9.2  RBC 3.15* 2.69*  HCT 28.4* 24.4*  PLT 155 138*    Recent Labs  05/11/14 0550 05/12/14 0437  NA 140 139  K 4.8 3.4*  CL 104 106  CO2 26 19  BUN 20 18  CREATININE 1.15 0.90  GLUCOSE 127* 103*  CALCIUM 7.6* 6.4*    Recent Labs  05/10/14 0945 05/12/14 0437  INR 1.59* 2.69*    Sensation intact distally Intact pulses distally Dorsiflexion/Plantar flexion intact Incision: moderate drainage  Assessment/Plan: 2 Days Post-Op Procedure(s) (LRB): Open Reduction Internal Fixation Right Hip (Right) Up with therapy Discharge to SNF when medically stable. Anemia workup per primary team.  Mcarthur Rossetti 05/12/2014, 8:19 AM

## 2014-05-12 NOTE — Progress Notes (Signed)
ANTICOAGULATION CONSULT NOTE - Follow Up Consult  Pharmacy Consult for Heparin >> Coumadin Indication: mechanical AVR  Allergies  Allergen Reactions  . Tetanus Toxoids Hives    Patient Measurements: Height: 5\' 8"  (172.7 cm) Weight: 185 lb (83.915 kg) IBW/kg (Calculated) : 68.4  Vital Signs: Temp: 98 F (36.7 C) (07/04 0522) Temp src: Oral (07/04 0522) BP: 166/76 mmHg (07/04 0522) Pulse Rate: 116 (07/04 0522)  Labs:  Recent Labs  05/10/14 0945 05/11/14 0550 05/11/14 2058 05/12/14 0437 05/12/14 0500  HGB 8.5* 9.3*  --  8.1*  --   HCT 26.3* 28.4*  --  24.4*  --   PLT 159 155  --  138*  --   LABPROT 19.0*  --   --  28.6*  --   INR 1.59*  --   --  2.69*  --   HEPARINUNFRC  --   --  0.20*  --  >2.20*  CREATININE 1.24 1.15  --  0.90  --     Estimated Creatinine Clearance: 63.3 ml/min (by C-G formula based on Cr of 0.9).   Medications:  Scheduled:  . aspirin EC  81 mg Oral Daily  . cyanocobalamin  1,000 mcg Intramuscular Q30 days  . docusate sodium  100 mg Oral BID  . donepezil  5 mg Oral QHS  . dorzolamide-timolol  1 drop Both Eyes BID  . furosemide  20 mg Intravenous Q12H  . latanoprost  1 drop Both Eyes QHS  . metoprolol  25 mg Oral BID  . multivitamin-lutein  1 capsule Oral BID  . pantoprazole  40 mg Oral Daily  . polyethylene glycol  17 g Oral Daily  . tamsulosin  0.4 mg Oral QHS  . Warfarin - Pharmacist Dosing Inpatient   Does not apply q1800    Assessment: 80 yo M bridged with heparin > Coumadin post-op for hx mechanical AVR.  Heparin has been off since this AM with reported therapeutic INR and loss of IV access.  Will d/c heparin and associated labs.  Continue Coumadin.  INR at goal after 2 x 7.5mg  doses.  Home Coumadin dose = 6mg  daily except 7mg  Sun/Thurs  Goal of Therapy:  INR 2-3 Monitor platelets by anticoagulation protocol: Yes   Plan:  Coumadin 6mg  PO x 1 tonight. Continue daily INR. D/C heparin infusion and associated labs.  [employer], Pharm.D., BCPS Clinical Pharmacist Pager [phone] 05/12/2014 2:36 PM

## 2014-05-12 NOTE — Progress Notes (Signed)
Patient loss IV site. IV team notified by night RN. IV team said they will come and get a new site for the patient.

## 2014-05-12 NOTE — Evaluation (Signed)
Occupational Therapy Re-Evaluation (post surgery) Patient Details Name: Clayton [name] MRN: [id number] DOB: Mar 28, 1926 Today's Date: 05/12/2014    History of Present Illness Pt admitted 05/06/14 after a fall resulting in a right lesser trochanter avulsion type fracture. He underwent surgery 05/10/14 and is now s/p right ORIF of proximal femur with IM screw.  Hx of CAD, HTN, legally blind, and s/p CABG.    Clinical Impression   This 80 yo male admitted and underwent above presents to acute OT now post surgery with decreased balance, decreased mobility (better than it was prior to surgery), decreased vision (pre-morbid), decreased use of LUE (pre-morbid)--all affecting his ability to care for himself and for his wife to care for him. He will benefit from acute OT with follow up at SNF.   Follow Up Recommendations  SNF    Equipment Recommendations   (TBD at next venue)       Precautions / Restrictions Precautions Precautions: Fall Restrictions Weight Bearing Restrictions: No RLE Weight Bearing: Weight bearing as tolerated Other Position/Activity Restrictions: pt is legally blind.        Mobility Bed Mobility Overal bed mobility: Needs Assistance Bed Mobility: Sit to Supine       Sit to supine: Max assist   General bed mobility comments: for Bil LEs and part of trunk  Transfers Overall transfer level: Needs assistance Equipment used: Left platform walker Transfers: Sit to/from Stand;Stand Pivot Transfers Sit to Stand: Min assist Stand pivot transfers: Mod assist       General transfer comment: VCs for safe hand placement and sequence of moving RW and legs    Balance Overall balance assessment: Needs assistance Sitting-balance support: Feet supported;Single extremity supported Sitting balance-Leahy Scale: Poor     Standing balance support: Bilateral upper extremity supported Standing balance-Leahy Scale: Poor                              ADL Overall ADL's :  Needs assistance/impaired Eating/Feeding: Minimal assistance (cueing for where things are due to being legally blind)   Grooming: Sitting;Minimal assistance   Upper Body Bathing: Minimal assitance;Sitting   Lower Body Bathing: Total assistance;Sit to/from stand   Upper Body Dressing : Moderate assistance;Sitting   Lower Body Dressing: Total assistance;Sit to/from stand   Toilet Transfer: Moderate assistance;Stand-pivot (recliner>bed going to pt's right and using PFRW)   Toileting- Clothing Manipulation and Hygiene: Total assistance;Sit to/from stand                         Pertinent Vitals/Pain 2/10 pain in RLE; repositioned and made RN aware.     Hand Dominance Right   Extremity/Trunk Assessment Upper Extremity Assessment LUE Deficits / Details: Broke his left arm above his elbow when he was 80 years old and has had multiple surgeries on it since that time (pt has a "short" arm from elbow distally with decreased use of hand as well. Decreased elbow extension as well LUE Coordination: decreased fine motor;decreased gross motor           Communication Communication Communication: HOH   Cognition Arousal/Alertness: Awake/alert Behavior During Therapy: WFL for tasks assessed/performed Overall Cognitive Status: Within Functional Limits for tasks assessed                                Home Living Family/patient expects to be discharged to:: Skilled  nursing facility                                             OT Diagnosis: Generalized weakness;Blindness and low vision;Acute pain   OT Problem List: Decreased strength;Decreased range of motion;Decreased activity tolerance;Impaired balance (sitting and/or standing);Pain;Impaired vision/perception;Decreased coordination;Impaired UE functional use;Decreased knowledge of use of DME or AE   OT Treatment/Interventions: Self-care/ADL training;Patient/family education;Balance  training;Therapeutic activities;DME and/or AE instruction    OT Goals(Current goals can be found in the care plan section) Acute Rehab OT Goals OT Goal Formulation: With patient/family Time For Goal Achievement: 05/19/14 Potential to Achieve Goals: Good  OT Frequency: Min 2X/week              End of Session Equipment Utilized During Treatment: Gait belt (right PFRW) Nurse Communication: Patient requests pain meds  Activity Tolerance: Patient limited by fatigue Patient left: in bed;with call bell/phone within reach;with family/visitor present   Time: 8416-6063 OT Time Calculation (min): 33 min Charges:  OT General Charges $OT Visit: 1 Procedure OT Evaluation $OT Re-eval: 1 Procedure OT Treatments $Self Care/Home Management : 8-22 mins  Almon Register [phone] 05/12/2014, 3:30 PM

## 2014-05-12 NOTE — Progress Notes (Addendum)
PROGRESS NOTE  Clayton [name] FIE:[id number] DOB: 1926/03/04 DOA: 05/06/2014 PCP: [name], MD  Brief history  80 y.o. male with PMH of HTN, HLD, CAD s/p CABG, s/p AVR, legally blind presented with mechanical fall and found to have trochanteric fracture R; Patient fell last night while walking to bathroom nto using his cane/walker. No LOC. As part of the work up for hip, CT of the right hip revealed new finding of crescentic sclerosis concerning for malignancy, therefore raising the concern of a pathologic fracture. MRI of the hip was obtained which did not reveal extension of fracture to greater trochanter. Skeletal survey was negative, but NM bone scan was positive for mets. Orthopedics has been consulted.   Assessment/Plan: Less trochanteric avulsion fracture of the right hip  -Concerned about possibility of pathologic fracture with new crescentic sclerosis noted on CT of the hip.  S/p R ORIF.  Stable.  For SNF, likely Monday  Crescentic sclerosis/Abnormal Bone Scan/Metstatic Prostate Cancer -Associated with the avulsed fragment  -Skeletal survey negative for metastatic or lytic lesions  -MRI right hip--no extension of fracture to greater trochanter -Nuclear medicine bone scan-positive uptake in the anterior inferior iliiac spine, Right scapular, T10--concerning for met0static disease -PSA--70.2 (in the office had been stable for years at 49) -Urology agrees likely prostate with mets.  Needs tissue diagnosis from surgery -pt already has outpt MedOnc appt with Dr. Juliann Mule on 05/21/14@130pm   Dementia without behavioral disturbance: Stable so far.  No sundowning.  Continue Aricept  Glaucoma: Continue drops.  Stable.  Status post aortic valve replacement  -Restarted coumadin.  Already therapeutic after one day.  Acute on chronic renal failure (CKD stage III)  -Baseline creatinine 1.2-1.4  -Clinically appears hypovolemic  -Discontinue lisinopril  IV fluids and blood, has improved,  at baseline since 7/1 Back to baseline. Stopped IV fluids  Normocytic anemia  -Patient has previous history of B12 deficiency and had previously received B12 injections  Iron studies unrevealing, likely from chronic disease Noted drop since admission.  Patient received blood on 7/1. Hgb up to 9.3 on 7/3.  Expect some drop in next few days from acute blood loss from surgery  Coronary artery disease with hx CABG  -Continue aspirin and metoprolol tartrate  -Adjust metoprolol to 25 mg twice a day   Diastolic heart failure-acute on chronic Reviewed older echocardiogram. History of diastolic heart failure. Given quickly therapeutic INR, suspect he may be mildly volume overloaded. Have ordered BNP and stopped IV fluids. Addendum: BNP up to 2000. Have started gentle IV Lasix  Family Communication: Spoke with wife at the bedside  Disposition Plan: Skilled nursing facility likely Monday    Procedures/Studies: Surgery Pending  Subjective: Feeling okay. Mild soreness. Has not moved bowels in several days. Wife reports moderate appetite    Objective: Filed Vitals:   05/11/14 2106 05/12/14 0522 05/12/14 0800 05/12/14 1435  BP: 123/54 166/76  137/53  Pulse: 104 116  97  Temp: 98.2 F (36.8 C) 98 F (36.7 C)  98.9 F (37.2 C)  TempSrc: Oral Oral  Oral  Resp: 20 20 18 18   Height:      Weight:      SpO2: 100% 98%  96%    Intake/Output Summary (Last 24 hours) at 05/12/14 1618 Last data filed at 05/12/14 1324  Gross per 24 hour  Intake    360 ml  Output      0 ml  Net    360  ml   Weight change:  Exam:   General: Fatigued, NAD, alert & oriented x 2  Cardiovascular: RRR, I4/P3, 2/6 systolic ejection murmur  Respiratory: Clear to auscultation bilaterally  Abdomen: Soft/+BS, non tender, non distended, Extremities: 1+LE edema, no clubbing or cyanosis, Data Reviewed: Basic Metabolic Panel:  Recent Labs Lab 05/07/14 0448 05/08/14 0940 05/10/14 0945 05/11/14 0550  05/12/14 0437  NA 142 143 140 140 139  K 4.4 4.1 3.8 4.8 3.4*  CL 105 106 104 104 106  CO2 27 25 25 26 19   GLUCOSE 124* 111* 128* 127* 103*  BUN 32* 35* 24* 20 18  CREATININE 1.90* 1.70* 1.24 1.15 0.90  CALCIUM 8.1* 7.8* 7.6* 7.6* 6.4*   Liver Function Tests:  Recent Labs Lab 05/07/14 0448 05/08/14 0940  AST 15 14  ALT 10 9  ALKPHOS 145* 136*  BILITOT 0.6 0.8  PROT 5.8* 5.6*  ALBUMIN 2.7* 2.5*   CBC:  Recent Labs Lab 05/08/14 0940 05/09/14 1515 05/10/14 0945 05/11/14 0550 05/12/14 0437  WBC 12.5* 11.8* 10.0 12.4* 9.2  HGB 7.8* 7.0* 8.5* 9.3* 8.1*  HCT 24.1* 21.8* 26.3* 28.4* 24.4*  MCV 93.8 90.8 90.7 90.2 90.7  PLT 157 189 159 155 138*     Scheduled Meds: . aspirin EC  81 mg Oral Daily  . cyanocobalamin  1,000 mcg Intramuscular Q30 days  . docusate sodium  100 mg Oral BID  . donepezil  5 mg Oral QHS  . dorzolamide-timolol  1 drop Both Eyes BID  . furosemide  20 mg Intravenous Q12H  . latanoprost  1 drop Both Eyes QHS  . metoprolol  25 mg Oral BID  . multivitamin-lutein  1 capsule Oral BID  . pantoprazole  40 mg Oral Daily  . polyethylene glycol  17 g Oral Daily  . tamsulosin  0.4 mg Oral QHS  . warfarin  6 mg Oral ONCE-1800  . Warfarin - Pharmacist Dosing Inpatient   Does not apply q1800   Continuous Infusions:     Annita Brod, MD [facility] Pager [phone]  If 7PM-7AM, please contact night-coverage [email or web address] Password [email or web address] 05/12/2014, 4:18 PM   LOS: 6 days

## 2014-05-13 DIAGNOSIS — C7951 Secondary malignant neoplasm of bone: Secondary | ICD-10-CM

## 2014-05-13 DIAGNOSIS — I5033 Acute on chronic diastolic (congestive) heart failure: Secondary | ICD-10-CM | POA: Clinically undetermined

## 2014-05-13 DIAGNOSIS — D62 Acute posthemorrhagic anemia: Secondary | ICD-10-CM

## 2014-05-13 DIAGNOSIS — C7952 Secondary malignant neoplasm of bone marrow: Secondary | ICD-10-CM

## 2014-05-13 LAB — CBC
HCT: 22.4 % — ABNORMAL LOW (ref 39.0–52.0)
Hemoglobin: 7.3 g/dL — ABNORMAL LOW (ref 13.0–17.0)
MCH: 29.6 pg (ref 26.0–34.0)
MCHC: 32.6 g/dL (ref 30.0–36.0)
MCV: 90.7 fL (ref 78.0–100.0)
Platelets: 136 10*3/uL — ABNORMAL LOW (ref 150–400)
RBC: 2.47 MIL/uL — ABNORMAL LOW (ref 4.22–5.81)
RDW: 15 % (ref 11.5–15.5)
WBC: 9.7 10*3/uL (ref 4.0–10.5)

## 2014-05-13 LAB — BASIC METABOLIC PANEL
ANION GAP: 11 (ref 5–15)
BUN: 21 mg/dL (ref 6–23)
CHLORIDE: 100 meq/L (ref 96–112)
CO2: 26 mEq/L (ref 19–32)
CREATININE: 1.33 mg/dL (ref 0.50–1.35)
Calcium: 7.6 mg/dL — ABNORMAL LOW (ref 8.4–10.5)
GFR, EST AFRICAN AMERICAN: 55 mL/min — AB (ref >90)
GFR, EST NON AFRICAN AMERICAN: 47 mL/min — AB (ref >90)
Glucose, Bld: 146 mg/dL — ABNORMAL HIGH (ref 70–99)
Potassium: 3.7 mEq/L (ref 3.7–5.3)
Sodium: 137 mEq/L (ref 137–147)

## 2014-05-13 LAB — TYPE AND SCREEN
ABO/RH(D): A POS
Antibody Screen: NEGATIVE
UNIT DIVISION: 0
UNIT DIVISION: 0
Unit division: 0
Unit division: 0

## 2014-05-13 LAB — PROTIME-INR
INR: 2.5 — AB (ref 0.00–1.49)
PROTHROMBIN TIME: 27 s — AB (ref 11.6–15.2)

## 2014-05-13 LAB — PREPARE RBC (CROSSMATCH)

## 2014-05-13 LAB — HEMOGLOBIN AND HEMATOCRIT, BLOOD
HEMATOCRIT: 24.7 % — AB (ref 39.0–52.0)
Hemoglobin: 8.1 g/dL — ABNORMAL LOW (ref 13.0–17.0)

## 2014-05-13 MED ORDER — WARFARIN SODIUM 7.5 MG PO TABS
7.5000 mg | ORAL_TABLET | Freq: Once | ORAL | Status: AC
  Administered 2014-05-13: 7.5 mg via ORAL
  Filled 2014-05-13: qty 1

## 2014-05-13 MED ORDER — HYDROCODONE-ACETAMINOPHEN 5-325 MG PO TABS: 1.0000 | ORAL_TABLET | ORAL | Status: DC | PRN

## 2014-05-13 MED ORDER — FUROSEMIDE 10 MG/ML IJ SOLN
20.0000 mg | Freq: Once | INTRAMUSCULAR | Status: AC
  Administered 2014-05-13: 20 mg via INTRAVENOUS

## 2014-05-13 NOTE — Progress Notes (Signed)
ANTICOAGULATION CONSULT NOTE - Follow Up Consult  Pharmacy Consult for Coumadin Indication: mechanical AVR  Allergies  Allergen Reactions  . Tetanus Toxoids Hives    Patient Measurements: Height: 5\' 8"  (172.7 cm) Weight: 202 lb (91.627 kg) IBW/kg (Calculated) : 68.4  Vital Signs: Temp: 97.9 F (36.6 C) (07/05 0500) Temp src: Oral (07/05 0500) BP: 122/54 mmHg (07/05 0500) Pulse Rate: 88 (07/05 0500)  Labs:  Recent Labs  05/10/14 0945 05/11/14 0550 05/11/14 2058 05/12/14 0437 05/12/14 0500 05/13/14 0350  HGB 8.5* 9.3*  --  8.1*  --  7.3*  HCT 26.3* 28.4*  --  24.4*  --  22.4*  PLT 159 155  --  138*  --  136*  LABPROT 19.0*  --   --  28.6*  --  27.0*  INR 1.59*  --   --  2.69*  --  2.50*  HEPARINUNFRC  --   --  0.20*  --  >2.20*  --   CREATININE 1.24 1.15  --  0.90  --  1.33    Estimated Creatinine Clearance: 44.6 ml/min (by C-G formula based on Cr of 1.33).   Medications:  Scheduled:  . aspirin EC  81 mg Oral Daily  . cyanocobalamin  1,000 mcg Intramuscular Q30 days  . docusate sodium  100 mg Oral BID  . donepezil  5 mg Oral QHS  . dorzolamide-timolol  1 drop Both Eyes BID  . furosemide  20 mg Intravenous Q12H  . latanoprost  1 drop Both Eyes QHS  . metoprolol  25 mg Oral BID  . multivitamin-lutein  1 capsule Oral BID  . pantoprazole  40 mg Oral Daily  . polyethylene glycol  17 g Oral Daily  . tamsulosin  0.4 mg Oral QHS  . Warfarin - Pharmacist Dosing Inpatient   Does not apply q1800    Assessment: 80 yo M bridged with heparin > Coumadin post-op for hx mechanical AVR.  Heparin stopped 7/4 with therapeutic INR.  INR remains therapeutic today but at lower end of goal.  Hgb is trending down, likely post-op anemia.  No bleeding noted.  Home Coumadin dose = 6mg  daily except 7mg  Sun/Thurs  Goal of Therapy:  INR 2-3 Monitor platelets by anticoagulation protocol: Yes   Plan:  Coumadin 7.5 mg PO x 1 tonight. Continue daily INR.  [employer],  Pharm.D., BCPS Clinical Pharmacist Pager [phone] 05/13/2014 8:24 AM

## 2014-05-13 NOTE — Progress Notes (Signed)
Patient ID: Clayton [name], male   DOB: 12-Mar-1926, 80 y.o.   MRN: [id number] Very slow progress with therapy.  Will need SNF placement.  Moderate bloody drainage from right hip incision.  Back on his Coumadin.  Can put full weight as tolerated on his right hip.

## 2014-05-13 NOTE — Progress Notes (Signed)
PROGRESS NOTE  Clayton [name] MWN:[id number] DOB: [birth year] DOA: 05/06/2014 PCP: [name], MD  Brief history  80 y.o. male with PMH of HTN, HLD, CAD s/p CABG, s/p AVR, legally blind presented with mechanical fall and found to have trochanteric fracture R; Patient fell last night while walking to bathroom nto using his cane/walker. No LOC. As part of the work up for hip, CT of the right hip revealed new finding of crescentic sclerosis concerning for malignancy, therefore raising the concern of a pathologic fracture. MRI of the hip was obtained which did not reveal extension of fracture to greater trochanter. Skeletal survey was negative, but NM bone scan was positive for mets. Orthopedics has been consulted.   Assessment/Plan: Less trochanteric avulsion fracture of the right hip  -Concerned about possibility of pathologic fracture with new crescentic sclerosis noted on CT of the hip.  S/p R ORIF.  Stable.  For SNF, likely Monday. Noted some mild bloody drainage from surgical site. We'll monitor   Crescentic sclerosis/Abnormal Bone Scan/Metstatic Prostate Cancer -Associated with the avulsed fragment  -Nuclear medicine bone scan-positive uptake in the anterior inferior iliiac spine, Right scapular, T10--concerning for met0static disease -PSA--70.2 (in the office had been stable for years at 46) -Urology agrees likely prostate with mets.   tissue obtained from surgery, hopefully pathology back tomorrow 7/6 -pt already has outpt MedOnc appt with Dr. Juliann Mule on 05/21/14@130pm   Dementia without behavioral disturbance: Stable so far.  No sundowning.  Continue Aricept  Glaucoma: Continue drops.  Stable.  Status post aortic valve replacement  -Restarted coumadin.  Already therapeutic after one day, in the setting of volume overload  Acute on chronic renal failure (CKD stage III)  -Baseline creatinine 1.2-1.4  Creatinine normalized  Normocytic anemia In the setting of acute blood loss  anemia -Patient has previous history of B12 deficiency and had previously received B12 injections  Patient initially received blood on admission. Since then hemoglobin trending down and 7.3 today, likely acute blood loss anemia after surgery 2 days ago. Transfusing 1 unit packed red blood cells  Coronary artery disease with hx CABG  -Continue aspirin and metoprolol tartrate  -Adjust metoprolol to 25 mg twice a day   Diastolic heart failure-acute on chronic Reviewed older echocardiogram. History of diastolic heart failure. Given quickly therapeutic INR, suspect he may be mildly volume overloaded. H BNP elevated at IV Lasix started on 7/4. Getting blood today so additional Lasix to be given  Family Communication: Spoke with wife at the bedside  Disposition Plan: Skilled nursing facility likely Monday    Procedures/Studies:  status post ORIF done 7/2  Subjective: Feeling okay.  did not sleep well last night, otherwise feeling okay   Objective: Filed Vitals:   05/13/14 0507 05/13/14 0700 05/13/14 0800 05/13/14 1200  BP:      Pulse:      Temp:      TempSrc:      Resp:   18 20  Height:      Weight: 91.627 kg (202 lb)     SpO2:  98% 98% 96%    Intake/Output Summary (Last 24 hours) at 05/13/14 1330 Last data filed at 05/13/14 0713  Gross per 24 hour  Intake    720 ml  Output   1100 ml  Net   -380 ml   Weight change:  Exam:   General: Fatigued,  no acute distress, alert and oriented   Cardiovascular: RRR, Q0/H4, 2/6 systolic  ejection murmur  Respiratory: Clear to auscultation bilaterally  Abdomen: Soft/+BS, non tender, non distended,  musculoskeletal:  1+LE edema, no clubbing or cyanosis,  Data Reviewed: Basic Metabolic Panel:  Recent Labs Lab 05/08/14 0940 05/10/14 0945 05/11/14 0550 05/12/14 0437 05/13/14 0350  NA 143 140 140 139 137  K 4.1 3.8 4.8 3.4* 3.7  CL 106 104 104 106 100  CO2 25 25 26 19 26   GLUCOSE 111* 128* 127* 103* 146*  BUN 35* 24* 20 18  21   CREATININE 1.70* 1.24 1.15 0.90 1.33  CALCIUM 7.8* 7.6* 7.6* 6.4* 7.6*   Liver Function Tests:  Recent Labs Lab 05/07/14 0448 05/08/14 0940  AST 15 14  ALT 10 9  ALKPHOS 145* 136*  BILITOT 0.6 0.8  PROT 5.8* 5.6*  ALBUMIN 2.7* 2.5*   CBC:  Recent Labs Lab 05/09/14 1515 05/10/14 0945 05/11/14 0550 05/12/14 0437 05/13/14 0350  WBC 11.8* 10.0 12.4* 9.2 9.7  HGB 7.0* 8.5* 9.3* 8.1* 7.3*  HCT 21.8* 26.3* 28.4* 24.4* 22.4*  MCV 90.8 90.7 90.2 90.7 90.7  PLT 189 159 155 138* 136*     Scheduled Meds: . aspirin EC  81 mg Oral Daily  . cyanocobalamin  1,000 mcg Intramuscular Q30 days  . docusate sodium  100 mg Oral BID  . donepezil  5 mg Oral QHS  . dorzolamide-timolol  1 drop Both Eyes BID  . furosemide  20 mg Intravenous Q12H  . latanoprost  1 drop Both Eyes QHS  . metoprolol  25 mg Oral BID  . multivitamin-lutein  1 capsule Oral BID  . pantoprazole  40 mg Oral Daily  . polyethylene glycol  17 g Oral Daily  . tamsulosin  0.4 mg Oral QHS  . warfarin  7.5 mg Oral ONCE-1800  . Warfarin - Pharmacist Dosing Inpatient   Does not apply q1800   Continuous Infusions:     Annita Brod, MD [facility] Pager [phone]  If 7PM-7AM, please contact night-coverage [email or web address] Password [email or web address] 05/13/2014, 1:30 PM   LOS: 7 days

## 2014-05-14 ENCOUNTER — Encounter (HOSPITAL_COMMUNITY): Payer: Self-pay | Admitting: Orthopaedic Surgery

## 2014-05-14 LAB — UIFE/LIGHT CHAINS/TP QN, 24-HR UR
Albumin, U: DETECTED
Alpha 1, Urine: DETECTED — AB
Alpha 2, Urine: DETECTED — AB
BETA UR: DETECTED — AB
FREE KAPPA LT CHAINS, UR: 23.3 mg/dL — AB (ref 0.14–2.42)
FREE LAMBDA LT CHAINS, UR: 1.58 mg/dL — AB (ref 0.02–0.67)
Free Kappa/Lambda Ratio: 14.75 ratio — ABNORMAL HIGH (ref 2.04–10.37)
GAMMA UR: DETECTED — AB
Total Protein, Urine: 25.8 mg/dL

## 2014-05-14 LAB — BASIC METABOLIC PANEL
ANION GAP: 10 (ref 5–15)
BUN: 23 mg/dL (ref 6–23)
CALCIUM: 7.8 mg/dL — AB (ref 8.4–10.5)
CO2: 28 mEq/L (ref 19–32)
CREATININE: 1.4 mg/dL — AB (ref 0.50–1.35)
Chloride: 100 mEq/L (ref 96–112)
GFR calc Af Amer: 51 mL/min — ABNORMAL LOW (ref >90)
GFR calc non Af Amer: 44 mL/min — ABNORMAL LOW (ref >90)
Glucose, Bld: 96 mg/dL (ref 70–99)
Potassium: 3.9 mEq/L (ref 3.7–5.3)
Sodium: 138 mEq/L (ref 137–147)

## 2014-05-14 LAB — CBC
HCT: 26.2 % — ABNORMAL LOW (ref 39.0–52.0)
HEMOGLOBIN: 8.5 g/dL — AB (ref 13.0–17.0)
MCH: 29.9 pg (ref 26.0–34.0)
MCHC: 32.4 g/dL (ref 30.0–36.0)
MCV: 92.3 fL (ref 78.0–100.0)
Platelets: 159 10*3/uL (ref 150–400)
RBC: 2.84 MIL/uL — ABNORMAL LOW (ref 4.22–5.81)
RDW: 15.3 % (ref 11.5–15.5)
WBC: 8.3 10*3/uL (ref 4.0–10.5)

## 2014-05-14 LAB — TYPE AND SCREEN
ABO/RH(D): A POS
ANTIBODY SCREEN: NEGATIVE
Unit division: 0

## 2014-05-14 LAB — PROTIME-INR
INR: 3.29 — AB (ref 0.00–1.49)
PROTHROMBIN TIME: 33.5 s — AB (ref 11.6–15.2)

## 2014-05-14 LAB — PRO B NATRIURETIC PEPTIDE: Pro B Natriuretic peptide (BNP): 1564 pg/mL — ABNORMAL HIGH (ref 0–450)

## 2014-05-14 MED ORDER — ALUM & MAG HYDROXIDE-SIMETH 200-200-20 MG/5ML PO SUSP: 30.0000 mL | ORAL | Status: DC | PRN

## 2014-05-14 MED ORDER — WARFARIN SODIUM 2.5 MG PO TABS
2.5000 mg | ORAL_TABLET | Freq: Once | ORAL | Status: DC
  Filled 2014-05-14: qty 1

## 2014-05-14 MED ORDER — POLYETHYLENE GLYCOL 3350 17 G PO PACK: 17.0000 g | PACK | Freq: Every day | ORAL | Status: DC | PRN

## 2014-05-14 MED ORDER — DSS 100 MG PO CAPS: 100.0000 mg | ORAL_CAPSULE | Freq: Two times a day (BID) | ORAL | Status: DC

## 2014-05-14 MED ORDER — SODIUM CHLORIDE 0.9 % IV SOLN
INTRAVENOUS | Status: DC
  Administered 2014-05-14: 10:00:00 via INTRAVENOUS

## 2014-05-14 MED ORDER — HYDROCODONE-ACETAMINOPHEN 5-325 MG PO TABS: 1.0000 | ORAL_TABLET | ORAL | Status: DC | PRN

## 2014-05-14 NOTE — Progress Notes (Signed)
ANTICOAGULATION CONSULT NOTE - Follow Up Consult  Pharmacy Consult for Coumadin Indication: mechanical AVR  Allergies  Allergen Reactions  . Tetanus Toxoids Hives    Patient Measurements: Height: 5\' 8"  (172.7 cm) Weight: 200 lb 9.6 oz (90.992 kg) IBW/kg (Calculated) : 68.4  Vital Signs: Temp: 97.8 F (36.6 C) (07/06 0642) Temp src: Axillary (07/06 0642) BP: 154/76 mmHg (07/06 0642) Pulse Rate: 89 (07/06 0642)  Labs:  Recent Labs  05/11/14 2058  05/12/14 0437 05/12/14 0500 05/13/14 0350 05/13/14 2230 05/14/14 0434  HGB  --   < > 8.1*  --  7.3* 8.1* 8.5*  HCT  --   < > 24.4*  --  22.4* 24.7* 26.2*  PLT  --   --  138*  --  136*  --  159  LABPROT  --   --  28.6*  --  27.0*  --  33.5*  INR  --   --  2.69*  --  2.50*  --  3.29*  HEPARINUNFRC 0.20*  --   --  >2.20*  --   --   --   CREATININE  --   --  0.90  --  1.33  --  1.40*  < > = values in this interval not displayed.  Estimated Creatinine Clearance: 42.2 ml/min (by C-G formula based on Cr of 1.4).   Medications:  Scheduled:  . aspirin EC  81 mg Oral Daily  . cyanocobalamin  1,000 mcg Intramuscular Q30 days  . docusate sodium  100 mg Oral BID  . donepezil  5 mg Oral QHS  . dorzolamide-timolol  1 drop Both Eyes BID  . latanoprost  1 drop Both Eyes QHS  . metoprolol  25 mg Oral BID  . multivitamin-lutein  1 capsule Oral BID  . pantoprazole  40 mg Oral Daily  . polyethylene glycol  17 g Oral Daily  . tamsulosin  0.4 mg Oral QHS  . Warfarin - Pharmacist Dosing Inpatient   Does not apply q1800    Assessment: 80 yo M bridged with heparin > Coumadin post-op for hx mechanical AVR.  Heparin stopped 7/4 with therapeutic INR.  INR has increased quite a bit from yesterday. Hgb is stable. Some mild bleeding noted from surgical site  Home Coumadin dose = 6mg  daily except 7mg  Sun/Thurs  Goal of Therapy:  INR 2.5-3.5 Monitor platelets by anticoagulation protocol: Yes   Plan:  Coumadin 2.5 mg PO x 1  tonight. Continue daily INR.  Excell Seltzer, Pharm.D Clinical Pharmacist Pager [phone] 05/14/2014 11:32 AM

## 2014-05-14 NOTE — Progress Notes (Signed)
Clinical Social Worker facilitated patient discharge including contacting patient's wife and facility to confirm patient discharge plans.  Clinical information faxed to facility and patient agreeable with plan.  CSW arranged ambulance transport via [facility] to [facility] and [facility].  RN to call report prior to discharge.  Clinical Social Worker will sign off for now as social work intervention is no longer needed. Please consult [place] again if new need arises.  Jeanette Caprice, MSW, [place]

## 2014-05-14 NOTE — Discharge Instructions (Signed)
° °  Full weight as tolerated right hip. New dry dressing daily right hip incision. Can get incision wet daily in the shower.

## 2014-05-14 NOTE — Discharge Summary (Signed)
Physician Discharge Summary  Clayton [name] XTK:[id number] DOB: 04/09/1926 DOA: 05/06/2014  PCP: [name], MD  Admit date: 05/06/2014 Discharge date: 05/14/2014  Time spent: 25 minutes  Recommendations for Outpatient Follow-up:  1. Patient going to [facility] and rehabilitation skilled nursing facility 2. New medications: Colace 100 twice a day, when necessary MiraLAX, when necessary Vicodin, when necessary Maalox 3. Patient is a followup appointment with Dr. Juliann Mule, oncology on 7/13 4. Orthopedic instructions: Patient allowed to bear full weight on right hip. 5. Patient will follow up with orthopedics in 2 weeks 6. Patient will follow up with urology-Dr. Gaynelle Arabian in 2 weeks  Discharge Diagnoses:  Principal Problem:   Acute on chronic diastolic heart failure Active Problems:   AORTIC VALVE REPLACEMENT, HX OF   Closed fracture of lesser trochanter of femur   Fall at home   Femoral fracture   Acute on chronic renal failure   Dementia without behavioral disturbance   Acute blood loss anemia   Metastatic cancer to bone   Discharge Condition: improved, being discharged to skilled nursing facility  Diet recommendation: heart healthy  Filed Weights   05/12/14 2100 05/13/14 0507 05/14/14 0252  Weight: 92.08 kg (203 lb) 91.627 kg (202 lb) 90.992 kg (200 lb 9.6 oz)    History of present illness:  80 year old white male with past medical history of aortic valve replacement on chronic anticoagulation as well as hypertension presented on 6/28 after mechanical fall and found to have a right trochanteric fracture. In the emergency room x-rays and CT noted crescentic sclerosis around location of a false fragment suggesting underlying osseous metastatic lesion. Patient was admitted to the hospitalist service for reversal of anticoagulation and workup of questionable metastases in addition to going to the operating room.  Hospital Course:  Principal Problem:   Acute on chronic diastolic  heart failure: Following resumption of Coumadin, INR quickly became therapeutic within one day. Reviewed old echocardiogram the patient was found have grade 1 diastolic dysfunction. I suspected he was likely volume overloaded in the setting of previous blood in given plus IV fluids and surgery. BNP was mildly elevated at 2000. Patient started on gentle IV Lasix on 7/4 which was discontinued on day of discharge.  Active Problems:   AORTIC VALVE REPLACEMENT, HX OF: Patient was started on heparin. 2 bridge until surgery. Following surgery, Coumadin was restarted and by one day postop, already was therapeutic.his INR on day of discharge is 3.29    Closed fracture of lesser trochanter of femur: seen by orthopedic surgery. He underwent ORIF on 7/2. No complications. He tolerated surgery well. He'll undergo weights and will followup with orthopedics in 2 weeks.  He'll need short-term skilled nursing prior to returning home    Fall at home    Acute on chronic renal failure: Baseline creatinine around 1.4. Initially secondary to hypovolemia requiring hydration.creatinine on day of discharge is 1.4.    Dementia without behavioral disturbance: Patient had no issues during this hospitalization. No sundowning. He was continued on Aricept.    Acute blood loss anemia/anemia of chronic disease:patient is a history previously of B12 deficiency on injections. On admission, hemoglobin was noted to be 7.0 requiring 1 unit packed red blood cells. No signs of any GI bleed. Suspected likely this was from chronic disease/B12 deficiency. Following surgery, hemoglobin started trending down, this was felt to be secondary to acute blood loss anemia. His transient 1 additional unit packed red blood cells on 7/5. Hemoglobin on day of discharge is 8.4.  Crescentic sclerosis/metastatic prostate cancerMetastatic cancer to bone: As part of the work up for hip, CT of the right hip revealed new finding of crescentic sclerosis concerning  for malignancy, therefore raising the concern of a pathologic fracture. MRI of the hip was obtained which did not reveal extension of fracture to greater trochanter. Skeletal survey was negative, but NM bone scan was positive for mets in the anterior inferior iliac spine, right scapula and T10  Areas. A PSA was checked and found to be at 70.2. Patient and followed by urology and his PSA had been stable for many years staying at 8. Urology was consulted they felt that likely this was indeed prostate cancer with bone metastases.  Tissue taken during surgery, pathology pending. Patient has a couple no with medical oncology on 7/13.  Procedures:  Status post ORIF done 7/2  Consultations:  [facility]-orthopedic surgery  Discharge Exam: Filed Vitals:   05/14/14 1152  BP:   Pulse:   Temp:   Resp: 18    General: Alert and oriented x3, fatigue Cardiovascular: regular rate and rhythm, A2-Q3, soft 2/6 systolic ejection murmur Respiratory: clear to auscultation bilaterally, moderate inspiratory effort Extremities: Trace pitting edema  Discharge Instructions You were cared for by a hospitalist during your hospital stay. If you have any questions about your discharge medications or the care you received while you were in the hospital after you are discharged, you can call the unit and asked to speak with the hospitalist on call if the hospitalist that took care of you is not available. Once you are discharged, your primary care physician will handle any further medical issues. Please note that NO REFILLS for any discharge medications will be authorized once you are discharged, as it is imperative that you return to your primary care physician (or establish a relationship with a primary care physician if you do not have one) for your aftercare needs so that they can reassess your need for medications and monitor your lab values.  Discharge Instructions   Diet - low sodium heart healthy    Complete by:  As  directed      Full weight bearing    Complete by:  As directed   Laterality:  right  Extremity:  Lower     Increase activity slowly    Complete by:  As directed             Medication List         alum & mag hydroxide-simeth [phone] MG/5ML suspension  Commonly known as:  MAALOX/MYLANTA  Take 30 mLs by mouth every 4 (four) hours as needed for indigestion.     aspirin EC 81 MG tablet  Take 81 mg by mouth daily.     camphor-menthol lotion  Commonly known as:  SARNA  Apply topically 2 (two) times daily as needed for itching. To itchy area on right arm     cyanocobalamin 1000 MCG/ML injection  Commonly known as:  (VITAMIN B-12)  1000 mcg IM daily for 5 days, then weekly for 3 weeks, then monthly     donepezil 5 MG tablet  Commonly known as:  ARICEPT  Take 5 mg by mouth at bedtime.     dorzolamide-timolol 22.3-6.8 MG/ML ophthalmic solution  Commonly known as:  COSOPT  Place 1 drop into both eyes 2 (two) times daily.     DSS 100 MG Caps  Take 100 mg by mouth 2 (two) times daily.     HYDROcodone-acetaminophen 5-325 MG per tablet  Commonly known as:  NORCO/VICODIN  Take 1-2 tablets by mouth every 4 (four) hours as needed for moderate pain.     latanoprost 0.005 % ophthalmic solution  Commonly known as:  XALATAN  Place 1 drop into both eyes at bedtime.     lisinopril 10 MG tablet  Commonly known as:  PRINIVIL,ZESTRIL  Take 10 mg by mouth daily.     meclizine 25 MG tablet  Commonly known as:  ANTIVERT  Take 25 mg by mouth daily as needed for dizziness or nausea.     metoprolol 100 MG tablet  Commonly known as:  LOPRESSOR  Take 100 mg by mouth daily.     metoprolol 50 MG tablet  Commonly known as:  LOPRESSOR  Take 50 mg by mouth at bedtime.     multivitamin-lutein Caps capsule  Take 1 capsule by mouth 2 (two) times daily.     omeprazole 20 MG capsule  Commonly known as:  PRILOSEC  Take 20 mg by mouth daily.     polyethylene glycol packet  Commonly known  as:  MIRALAX / GLYCOLAX  Take 17 g by mouth daily as needed.     tamsulosin 0.4 MG Caps capsule  Commonly known as:  FLOMAX  Take 0.4 mg by mouth at bedtime.     warfarin 5 MG tablet  Commonly known as:  COUMADIN  Take 6-7 mg by mouth See admin instructions. Takes along with the 65m tablet to equal 772mon Sunday and Thursday.  67m54mll other days of the week.     warfarin 1 MG tablet  Commonly known as:  COUMADIN  Take 6-7 mg by mouth See admin instructions. Takes along with the 5mg65mblet to equal 7mg 49mSunday and Thursday.  67mg a867mother days of the week.       Allergies  Allergen Reactions  . Tetanus Toxoids Hives       Follow-up Information   Follow up with CHISM, DAVID, MD. (May 21, 2014--130PM)    Specialty:  Internal Medicine   Contact information:   [address] [place] [address] [phone] Follow up with TANNENCarolan Clines In 2 weeks.   Specialty:  Urology   Contact information:   [address] [phone] Follow up with BLACKMMcarthur RossettiSchedule an appointment as soon as possible for a visit in 2 weeks.   Specialty:  Orthopedic Surgery   Contact information:   [id number] [phone]  The results of significant diagnostics from this hospitalization (including imaging, microbiology, ancillary and laboratory) are listed below for reference.    Significant Diagnostic Studies: Dg Chest 1 View  05/06/2014    IMPRESSION: No active disease.  Status post CABG.   Electronically Signed   By: Liviu Lahoma Crocker  On: 05/06/2014 13:35   Dg Pelvis 1-2 Views  05/06/2014     IMPRESSION: Avulsed fracture of the lesser trochanter.  Possible intertrochanteric femur fracture.  CT is recommended.   Electronically Signed   By: Art  HMaryclare Skillin  On: 05/06/2014 13:37   Dg Femur Right  05/11/2014   .  IMPRESSION: Right femur internal fixation.   Electronically Signed   By:  JonathJorje Guild  On: 05/11/2014 04:04   Dg Femur Right  05/06/2014    IMPRESSION: Avulsed fracture of  the right lesser trochanter. There may be an intertrochanteric fracture. Attention on pelvic radiograph is recommended.   Electronically Signed   By: Maryclare Ranta M.D.   On: 05/06/2014 13:35   Ct Head Wo Contrast  05/06/2014     IMPRESSION: No acute intracranial pathology.   Electronically Signed   By: Maryclare Seydel M.D.   On: 05/06/2014 13:04   Nm Bone Scan Whole Body  05/07/2014   .  IMPRESSION: 1. Uptake at the site of the fracture. 2. Uptake within the anterior inferior iliac spine on the right is concerning for metastatic disease. This corresponds with a focal sclerotic lesion. 3. The uptake within the right scapula may represent metastatic disease. 4. Focal uptake at T10 on the right was not present on the prior MRI. This may represent new disease.   Electronically Signed   By: Lawrence Santiago M.D.   On: 05/07/2014 16:52   Ct Hip Right Wo Contrast  05/06/2014     IMPRESSION: Displaced avulsion fracture of the RIGHT lesser trochanter. Crescentic sclerosis around the location of the avulsed fragment suggests an underlying osseous lesion. This crescentic sclerosis is new compared to the prior CT and is compatible with an underlying osseous metastatic lesion. Followup whole-body bone scan recommended to assess for other foci of metastatic disease. In this older man, PSA should also be obtained.   Electronically Signed   By: Dereck Ligas M.D.   On: 05/06/2014 15:39   Mr Hip Right Wo Contrast  05/08/2014  IMPRESSION: 1. Right lesser trochanteric avulsion fracture with an underlying osseous lesion in the right proximal femur concerning for malignancy. 2. Multiple hypointense lesions involving the right acetabulum, left acetabulum, right proximal femur and left proximal femur most concerning for metastatic disease.   Electronically Signed   By: Kathreen Devoid   On: 05/08/2014 09:05   Dg Bone Survey  Met  05/07/2014   IMPRESSION: Negative metastatic bone survey for lytic or sclerotic metastatic disease.  Avulsion fracture at the lesser trochanter of the right hip.  Degenerative joint disease.   Electronically Signed   By: Kalman Jewels M.D.   On: 05/07/2014 07:33   Dg C-arm 1-60 Min  05/11/2014  IMPRESSION: Right femur internal fixation.   Electronically Signed   By: Jorje Guild M.D.   On: 05/11/2014 04:04    Microbiology: Recent Results (from the past 240 hour(s))  SURGICAL PCR SCREEN     Status: None   Collection Time    05/09/14  3:08 PM      Result Value Ref Range Status   MRSA, PCR NEGATIVE  NEGATIVE Final   Staphylococcus aureus NEGATIVE  NEGATIVE Final   Comment:            The Xpert SA Assay (FDA     approved for NASAL specimens     in patients over 89 years of age),     is one component of     a comprehensive surveillance     program.  Test performance has     been validated by [employer] for patients greater     than or equal to 40 year old.     It is not intended     to diagnose infection nor to     guide or monitor treatment.     Labs: Basic Metabolic Panel:  Recent Labs Lab 05/10/14 0945 05/11/14 0550 05/12/14 0437 05/13/14 0350 05/14/14 0434  NA 140 140 139 137  138  K 3.8 4.8 3.4* 3.7 3.9  CL 104 104 106 100 100  CO2 25 26 19 26 28   GLUCOSE 128* 127* 103* 146* 96  BUN 24* 20 18 21 23   CREATININE 1.24 1.15 0.90 1.33 1.40*  CALCIUM 7.6* 7.6* 6.4* 7.6* 7.8*   Liver Function Tests:  Recent Labs Lab 05/08/14 0940  AST 14  ALT 9  ALKPHOS 136*  BILITOT 0.8  PROT 5.6*  ALBUMIN 2.5*   No results found for this basename: LIPASE, AMYLASE,  in the last 168 hours No results found for this basename: AMMONIA,  in the last 168 hours CBC:  Recent Labs Lab 05/10/14 0945 05/11/14 0550 05/12/14 0437 05/13/14 0350 05/13/14 2230 05/14/14 0434  WBC 10.0 12.4* 9.2 9.7  --  8.3  HGB 8.5* 9.3* 8.1* 7.3* 8.1* 8.5*  HCT 26.3* 28.4* 24.4*  22.4* 24.7* 26.2*  MCV 90.7 90.2 90.7 90.7  --  92.3  PLT 159 155 138* 136*  --  159   Cardiac Enzymes: No results found for this basename: CKTOTAL, CKMB, CKMBINDEX, TROPONINI,  in the last 168 hours BNP: BNP (last 3 results)  Recent Labs  05/12/14 1817 05/14/14 0434  PROBNP 2081.0* 1564.0*   CBG:  Recent Labs Lab 05/10/14 1201  GLUCAP 124*       Signed:  KRISHNAN,SENDIL K  [facility] 05/14/2014, 1:11 PM

## 2014-05-14 NOTE — Progress Notes (Addendum)
Update: CSW spoke to wife about bed offer from [facility]. Patient's wife was not happy that [facility] took their bed offer back. CSW explained that wife can work with [facility] to see if when a bed opens up, patient can then be transferred to [facility]. CSW encouraged wife to work with the social workers at [facility] and [facility]. Wife verbalized understanding and was agreeable to patient going to [facility] and states she will work with the social workers to see if [facility] can take patient when a bed opens up. CSW notified MD. Patient's wife agreeable to EMS transfer today. CSW notified [facility] and [facility] received a phone call from [facility] stating that they cannot take patient any more because they do not have any more SNF beds. CSW called patient's wife to inform and patient's wife asked social worker to try other facilities in [facility]. CSW continues to search for a SNF bed.  Clayton [name], MSW, [place]

## 2014-05-14 NOTE — Progress Notes (Signed)
Physical Therapy Treatment Patient Details Name: Clayton [name] MRN: [id number] DOB: 24-Aug-1926 Today's Date: 05/14/2014    History of Present Illness Pt admitted 05/06/14 after a fall resulting in a right lesser trochanter avulsion type fracture. He underwent surgery 05/10/14 and is now s/p right ORIF of proximal femur with IM screw.  Hx of CAD, HTN, legally blind, and s/p CABG.     PT Comments    Pt agreeable to mobility, but fatigues quickly.  Able to amb today.  Will continue to follow.    Follow Up Recommendations  SNF     Equipment Recommendations   (TBD)    Recommendations for Other Services       Precautions / Restrictions Precautions Precautions: Fall Restrictions Weight Bearing Restrictions: Yes RLE Weight Bearing: Weight bearing as tolerated Other Position/Activity Restrictions: pt is legally blind.      Mobility  Bed Mobility Overal bed mobility: Needs Assistance Bed Mobility: Supine to Sit     Supine to sit: Mod assist;HOB elevated     General bed mobility comments: A with Bil LEs and to bring trunk up to sitting.    Transfers Overall transfer level: Needs assistance Equipment used: Left platform walker Transfers: Sit to/from Stand Sit to Stand: Mod assist         General transfer comment: cues for UE use.  Increased A with coming to stand.    Ambulation/Gait Ambulation/Gait assistance: Min assist Ambulation Distance (Feet): 4 Feet Assistive device: Rolling walker (2 wheeled) Gait Pattern/deviations: Step-to pattern;Decreased step length - left;Decreased stance time - right;Trunk flexed     General Gait Details: cues for gait sequencing and positioning within RW.  Encouragement for amb distance.     Stairs            Wheelchair Mobility    Modified Rankin (Stroke Patients Only)       Balance Overall balance assessment: Needs assistance Sitting-balance support: No upper extremity supported Sitting balance-Leahy Scale: Fair      Standing balance support: Bilateral upper extremity supported Standing balance-Leahy Scale: Poor                      Cognition Arousal/Alertness: Awake/alert Behavior During Therapy: WFL for tasks assessed/performed Overall Cognitive Status: Within Functional Limits for tasks assessed                      Exercises      General Comments        Pertinent Vitals/Pain "Only a little"  Premedicated.      Home Living                      Prior Function            PT Goals (current goals can now be found in the care plan section) Acute Rehab PT Goals Time For Goal Achievement: 05/18/14 Potential to Achieve Goals: Good Progress towards PT goals: Progressing toward goals    Frequency  Min 3X/week    PT Plan Current plan remains appropriate    Co-evaluation             End of Session Equipment Utilized During Treatment: Gait belt Activity Tolerance: Patient limited by fatigue Patient left: in chair;with call bell/phone within reach;with family/visitor present     Time: 1607-3710 PT Time Calculation (min): 28 min  Charges:  $Gait Training: 8-22 mins $Therapeutic Activity: 8-22 mins  G CodesCatarina Hartshorn, [place] 05/14/2014, 2:38 PM

## 2014-05-21 ENCOUNTER — Ambulatory Visit: Payer: Commercial Managed Care - HMO

## 2014-05-21 ENCOUNTER — Inpatient Hospital Stay (HOSPITAL_COMMUNITY)
Admission: AD | Admit: 2014-05-21 | Discharge: 2014-05-25 | DRG: 920 | Disposition: A | Discharge status: Skilled Nursing Facility | Payer: Medicare HMO | Source: Other Acute Inpatient Hospital | Attending: Internal Medicine | Admitting: Internal Medicine

## 2014-05-21 ENCOUNTER — Other Ambulatory Visit: Payer: Commercial Managed Care - HMO

## 2014-05-21 DIAGNOSIS — T45515A Adverse effect of anticoagulants, initial encounter: Secondary | ICD-10-CM | POA: Diagnosis present

## 2014-05-21 DIAGNOSIS — IMO0002 Reserved for concepts with insufficient information to code with codable children: Secondary | ICD-10-CM | POA: Diagnosis present

## 2014-05-21 DIAGNOSIS — Z66 Do not resuscitate: Secondary | ICD-10-CM | POA: Diagnosis present

## 2014-05-21 DIAGNOSIS — C7951 Secondary malignant neoplasm of bone: Secondary | ICD-10-CM | POA: Diagnosis present

## 2014-05-21 DIAGNOSIS — I359 Nonrheumatic aortic valve disorder, unspecified: Secondary | ICD-10-CM

## 2014-05-21 DIAGNOSIS — Z7901 Long term (current) use of anticoagulants: Secondary | ICD-10-CM

## 2014-05-21 DIAGNOSIS — Z96641 Presence of right artificial hip joint: Secondary | ICD-10-CM

## 2014-05-21 DIAGNOSIS — I2510 Atherosclerotic heart disease of native coronary artery without angina pectoris: Secondary | ICD-10-CM | POA: Diagnosis present

## 2014-05-21 DIAGNOSIS — I509 Heart failure, unspecified: Secondary | ICD-10-CM | POA: Diagnosis present

## 2014-05-21 DIAGNOSIS — F0391 Unspecified dementia with behavioral disturbance: Secondary | ICD-10-CM

## 2014-05-21 DIAGNOSIS — Z954 Presence of other heart-valve replacement: Secondary | ICD-10-CM

## 2014-05-21 DIAGNOSIS — F0390 Unspecified dementia without behavioral disturbance: Secondary | ICD-10-CM | POA: Diagnosis present

## 2014-05-21 DIAGNOSIS — I5030 Unspecified diastolic (congestive) heart failure: Secondary | ICD-10-CM | POA: Diagnosis present

## 2014-05-21 DIAGNOSIS — Z9109 Other allergy status, other than to drugs and biological substances: Secondary | ICD-10-CM | POA: Diagnosis not present

## 2014-05-21 DIAGNOSIS — S065XAA Traumatic subdural hemorrhage with loss of consciousness status unknown, initial encounter: Secondary | ICD-10-CM

## 2014-05-21 DIAGNOSIS — Z471 Aftercare following joint replacement surgery: Secondary | ICD-10-CM

## 2014-05-21 DIAGNOSIS — I739 Peripheral vascular disease, unspecified: Secondary | ICD-10-CM | POA: Diagnosis present

## 2014-05-21 DIAGNOSIS — I129 Hypertensive chronic kidney disease with stage 1 through stage 4 chronic kidney disease, or unspecified chronic kidney disease: Secondary | ICD-10-CM | POA: Diagnosis present

## 2014-05-21 DIAGNOSIS — I714 Abdominal aortic aneurysm, without rupture, unspecified: Secondary | ICD-10-CM | POA: Diagnosis present

## 2014-05-21 DIAGNOSIS — I10 Essential (primary) hypertension: Secondary | ICD-10-CM

## 2014-05-21 DIAGNOSIS — S065X9A Traumatic subdural hemorrhage with loss of consciousness of unspecified duration, initial encounter: Secondary | ICD-10-CM

## 2014-05-21 DIAGNOSIS — C7952 Secondary malignant neoplasm of bone marrow: Secondary | ICD-10-CM | POA: Diagnosis present

## 2014-05-21 DIAGNOSIS — K219 Gastro-esophageal reflux disease without esophagitis: Secondary | ICD-10-CM | POA: Diagnosis present

## 2014-05-21 DIAGNOSIS — E785 Hyperlipidemia, unspecified: Secondary | ICD-10-CM | POA: Diagnosis present

## 2014-05-21 DIAGNOSIS — D649 Anemia, unspecified: Secondary | ICD-10-CM | POA: Diagnosis present

## 2014-05-21 DIAGNOSIS — Z951 Presence of aortocoronary bypass graft: Secondary | ICD-10-CM | POA: Diagnosis not present

## 2014-05-21 DIAGNOSIS — D689 Coagulation defect, unspecified: Secondary | ICD-10-CM

## 2014-05-21 DIAGNOSIS — N189 Chronic kidney disease, unspecified: Secondary | ICD-10-CM | POA: Diagnosis present

## 2014-05-21 DIAGNOSIS — I5033 Acute on chronic diastolic (congestive) heart failure: Secondary | ICD-10-CM

## 2014-05-21 DIAGNOSIS — D62 Acute posthemorrhagic anemia: Secondary | ICD-10-CM | POA: Diagnosis present

## 2014-05-21 DIAGNOSIS — I5032 Chronic diastolic (congestive) heart failure: Secondary | ICD-10-CM | POA: Diagnosis present

## 2014-05-21 DIAGNOSIS — C61 Malignant neoplasm of prostate: Secondary | ICD-10-CM | POA: Diagnosis present

## 2014-05-21 DIAGNOSIS — H547 Unspecified visual loss: Secondary | ICD-10-CM

## 2014-05-21 DIAGNOSIS — Z87891 Personal history of nicotine dependence: Secondary | ICD-10-CM | POA: Diagnosis not present

## 2014-05-21 DIAGNOSIS — F03918 Unspecified dementia, unspecified severity, with other behavioral disturbance: Secondary | ICD-10-CM

## 2014-05-21 DIAGNOSIS — I25709 Atherosclerosis of coronary artery bypass graft(s), unspecified, with unspecified angina pectoris: Secondary | ICD-10-CM

## 2014-05-21 DIAGNOSIS — T888XXS Other specified complications of surgical and medical care, not elsewhere classified, sequela: Secondary | ICD-10-CM

## 2014-05-21 MED ORDER — SODIUM CHLORIDE 0.9 % IV SOLN
INTRAVENOUS | Status: DC
  Administered 2014-05-22: 02:00:00 via INTRAVENOUS

## 2014-05-21 MED ORDER — TAMSULOSIN HCL 0.4 MG PO CAPS
0.4000 mg | ORAL_CAPSULE | Freq: Every day | ORAL | Status: DC
  Administered 2014-05-22 – 2014-05-24 (×3): 0.4 mg via ORAL
  Filled 2014-05-21 (×5): qty 1

## 2014-05-21 MED ORDER — SODIUM CHLORIDE 0.9 % IJ SOLN
3.0000 mL | Freq: Two times a day (BID) | INTRAMUSCULAR | Status: DC
  Administered 2014-05-22 – 2014-05-24 (×4): 3 mL via INTRAVENOUS

## 2014-05-21 MED ORDER — DONEPEZIL HCL 5 MG PO TABS
5.0000 mg | ORAL_TABLET | Freq: Every day | ORAL | Status: DC
  Administered 2014-05-22 – 2014-05-24 (×3): 5 mg via ORAL
  Filled 2014-05-21 (×5): qty 1

## 2014-05-21 NOTE — H&P (Addendum)
Hospitalist Admission History and Physical  Patient name: Clayton [name] Medical record number: [id number] Date of birth: 1925/12/06 Age: 80 y.o. Gender: male  Primary Care Provider: [name], MD  Chief Complaint: anemia, supratherapeutic INR  History of Present Illness:This is a 80 y.o. year old male with noted hx/o diastolic CHF, dementia, aortic valve disorder s/p mechanical aortic valve replacement- on coumadin, dementia, recent admission for closed fracture of R hip s/p ORIF 05/10/2014, acute blood loss anemia  presenting with anemia, supratherapeutic INR. Patient noted to have been admitted June 28 -July 6 for closed fracture of the lesser trochanter of the femur on the right side. Was surgically repaired on July 2 with no complications. Was also noted to have a mild acute on chronic diastolic heart failure exacerbation in the setting of IV fluids and blood administration. Noted prior imaging on presentation was concerning for metastatic lesions of the affected bone-? Pathologic fracture. Recent PSA of 70.2 patient was discharged to [facility] rehabilitation. Per report, patient has been progressively weak and pale appearing. No reported falls. Patient had hemoglobin around 5. Was directed to go to [place] there, hemoglobin 5.8, INR 10.4, white blood cell count 11.3, creatinine 1.4. Hemoccult was trace positive. Urinalysis with trace blood. Pro BNP at 4800. She given 2 units of blood at [facility]. Was also given one unit of FFP as well as 2 rounds of vitamin K. Initial vital signs at [facility], temperature 98.2, heart rate 69, respirations 18, blood pressure 96/51, satting at 96% on room air. A CT abd and pelvis/hip @ [facility] shows post operative changes in R hip with hematomas in R gluteal musculature and proximal, medial R thigh- R LE CT shows hematoma in R adductor magnus and hematoma along R greater trochanter extending along post aspect of femoral neck.   CXR-minimal R basilar atelectasis.   Assessment and Plan: Clayton [name] is a 80 y.o. year old male presenting with anemia, supratherapeutic INR.   Anemia: ABLA in setting of supratherapeutic INR. S/p vitamin K x2, FFP x1 and 2 units pRBCs. Stat CBC and INR. Hold anticoagulation, antiplatelet. Seemingly multiple sites of bleeding. GI, GU. Post surgical site is major concern. Noted serous drainage from site with dressing change by nursing which is reassuring (vs. Serosanguenous). Step down. Try to avoid volume overload with IVFs and pRBCs. Check weight. hosp d/c 7/6 weight 91 kg. Continue to follow. Repeat hemoccult. Consider GI consult.    R hip: multiple hematomas on imaging. Consult ortho. No active bleeding superficially from surgical sites. F/u ortho recs.   Aortic valve disease s/p mechanical valve/CHF: Hold anticoagulation given above. Cycle CEs. Check weight on admission. Pro BNP. Telemetry. Hold antihypertensive as BPs LLN on presentation in setting of above. Follow closely. Cards consult in am or sooner as indicated.   Metastatic prostate Ca: Has not yet followed up with H-O. Consider h-o c/s in house pending resolution of above.   Dementia: cont aricept. Mild confusion on presentation. Check head CT.   Leukocytosis: Repeat WBC on admission. Pan culture. Afebrile on initial presentation.    FEN/GI: NPO PMN.  Prophylaxis: SCDs Disposition: pending further evaluation  Code Status:Full Code    Patient Active Problem List   Diagnosis Date Noted  . Anemia 05/21/2014  . Acute on chronic diastolic heart failure 23/55/7322  . Chronic diastolic heart failure 02/54/2706  . Metastatic cancer to bone 05/11/2014  . Dementia without behavioral disturbance 05/09/2014  . Acute blood loss anemia 05/09/2014  . Acute  on chronic renal failure 05/07/2014  . Closed fracture of lesser trochanter of femur 05/06/2014  . Fall at home 05/06/2014  . Femoral fracture 05/06/2014  . B12 deficiency  10/19/2013  . Impaired vision 10/19/2013  . Chronic anticoagulation 10/19/2013  . Fall 10/18/2013  . Weakness 10/18/2013  . Preoperative clearance 08/23/2013  . Right inguinal hernia 08/15/2013  . Aortic valve disorders 12/15/2010  . CAD, ARTERY BYPASS GRAFT 11/29/2009  . HYPERLIPIDEMIA 07/01/2009  . HYPERTENSION, UNSPECIFIED 07/01/2009  . AORTIC VALVE REPLACEMENT, HX OF 07/01/2009   Past Medical History: Past Medical History  Diagnosis Date  . CAD (coronary artery disease)     s/p CABG 1999  . HLD (hyperlipidemia)   . HTN (hypertension)   . GERD (gastroesophageal reflux disease)   . H/O hiatal hernia   . Arthritis   . Peripheral vascular disease     aneursym  . Aortic aneurysm     at least 3 cmm infrarenal AAA by lumbar CT 05/22/13 [facility]); 41 mm proximal ascending aorta on echo 05/05/13 [facility])    Past Surgical History: Past Surgical History  Procedure Laterality Date  . Aortic valve replacement  1998  . Coronary artery bypass graft  1998  . Cholecystectomy    . Tonsillectomy    . US extremity*l* Left     arm  . Inguinal hernia repair  09/25/2013    with mesh  . Inguinal hernia repair Right 09/25/2013    Procedure: HERNIA REPAIR INGUINAL ADULT;  Surgeon: Imogene Burn. Georgette Dover, MD;  Location: [place];  Service: General;  Laterality: Right;  . Insertion of mesh Right 09/25/2013    Procedure: INSERTION OF MESH;  Surgeon: Imogene Burn. Georgette Dover, MD;  Location: [place];  Service: General;  Laterality: Right;  . Hand surgery Left     MULTIPLE   . Intramedullary (im) nail intertrochanteric Right 05/10/2014    Procedure: Open Reduction Internal Fixation Right Hip;  Surgeon: Mcarthur Rossetti, MD;  Location: [place];  Service: Orthopedics;  Laterality: Right;    Social History: History   Social History  . Marital Status: Married    Spouse Name: N/A    Number of Children: 3  . Years of Education: N/A   Occupational History  . retired    Social History Main  Topics  . Smoking status: Former Research scientist (life sciences)  . Smokeless tobacco: Never Used     Comment: quit smoking 45 years ago "  . Alcohol Use: No  . Drug Use: No  . Sexual Activity: Not on file   Other Topics Concern  . Not on file   Social History Narrative  . No narrative on file    Family History: Family History  Problem Relation Age of Onset  . Coronary artery disease      Allergies: Allergies  Allergen Reactions  . Tetanus Toxoids Hives    Current Facility-Administered Medications  Medication Dose Route Frequency Provider Last Rate Last Dose  . 0.9 %  sodium chloride infusion   Intravenous Continuous Shanda Howells, MD      . donepezil (ARICEPT) tablet 5 mg  5 mg Oral QHS Shanda Howells, MD      . sodium chloride 0.9 % injection 3 mL  3 mL Intravenous Q12H Shanda Howells, MD      . tamsulosin (FLOMAX) capsule 0.4 mg  0.4 mg Oral QHS Shanda Howells, MD       Review Of Systems: 12 point ROS negative except as noted above in HPI.  Physical Exam: Filed Vitals:   05/21/14 2230  BP: 110/47  Pulse: 73  Temp: 97.8 F (36.6 C)  Resp: 20    General: cooperative, fatigued and pale  HEENT: PERRLA and extra ocular movement intact Heart: S1, S2 normal, no murmur, rub or gallop, regular rate and rhythm Lungs: clear to auscultation, no wheezes or rales and unlabored breathing Abdomen: + bowel sounds, mild generalized abd TTP,  Extremities: + RLE swelling, RLE surgical sites CDI Skin:pale  Neurology: normal without focal findings, mental status, speech normal, alert and oriented x3 and PERLA  Labs and Imaging: Lab Results  Component Value Date/Time   NA 138 05/14/2014  4:34 AM   K 3.9 05/14/2014  4:34 AM   CL 100 05/14/2014  4:34 AM   CO2 28 05/14/2014  4:34 AM   BUN 23 05/14/2014  4:34 AM   CREATININE 1.40* 05/14/2014  4:34 AM   GLUCOSE 96 05/14/2014  4:34 AM   Lab Results  Component Value Date   WBC 8.3 05/14/2014   HGB 8.5* 05/14/2014   HCT 26.2* 05/14/2014   MCV 92.3 05/14/2014   PLT 159  05/14/2014    No results found.         Shanda Howells MD  Pager: [phone]

## 2014-05-21 NOTE — Progress Notes (Signed)
Report received fm Harriette Bouillon. Pt arrived via [place] hospital. Pt was sent to [facility] ED fm [facility] for recent hip surg. Pt had progressivly become more sleepy and obtunde at rehab.  Pt INR elevated at 10.4 and Pt of 117.  Pt received 5 units Vit K at [facility] rehab and 5 additional units at ED. Also received 1 unit of FFP and 1 unit of PRBCs. PRBCs started by [facility] during transport. PRBCs almost complete at the time of this note.  At [place] noted to have trace blood on hemacult card and blood in urine. CT of R hip also showed Hematoma in right hip.

## 2014-05-22 ENCOUNTER — Inpatient Hospital Stay (HOSPITAL_COMMUNITY): Payer: Medicare HMO

## 2014-05-22 DIAGNOSIS — I5032 Chronic diastolic (congestive) heart failure: Secondary | ICD-10-CM

## 2014-05-22 DIAGNOSIS — I359 Nonrheumatic aortic valve disorder, unspecified: Secondary | ICD-10-CM

## 2014-05-22 DIAGNOSIS — T889XXS Complication of surgical and medical care, unspecified, sequela: Secondary | ICD-10-CM

## 2014-05-22 DIAGNOSIS — I10 Essential (primary) hypertension: Secondary | ICD-10-CM

## 2014-05-22 LAB — CBC WITH DIFFERENTIAL/PLATELET
BASOS ABS: 0 10*3/uL (ref 0.0–0.1)
BASOS ABS: 0 10*3/uL (ref 0.0–0.1)
BASOS PCT: 0 % (ref 0–1)
BASOS PCT: 0 % (ref 0–1)
Basophils Absolute: 0 10*3/uL (ref 0.0–0.1)
Basophils Absolute: 0 10*3/uL (ref 0.0–0.1)
Basophils Relative: 0 % (ref 0–1)
Basophils Relative: 0 % (ref 0–1)
EOS ABS: 0.6 10*3/uL (ref 0.0–0.7)
EOS PCT: 5 % (ref 0–5)
EOS PCT: 5 % (ref 0–5)
Eosinophils Absolute: 0.7 10*3/uL (ref 0.0–0.7)
Eosinophils Absolute: 0.6 10*3/uL (ref 0.0–0.7)
Eosinophils Absolute: 0.2 10*3/uL (ref 0.0–0.7)
Eosinophils Relative: 6 % — ABNORMAL HIGH (ref 0–5)
Eosinophils Relative: 2 % (ref 0–5)
HCT: 23.1 % — ABNORMAL LOW (ref 39.0–52.0)
HCT: 24.3 % — ABNORMAL LOW (ref 39.0–52.0)
HCT: 25.4 % — ABNORMAL LOW (ref 39.0–52.0)
HEMATOCRIT: 23 % — AB (ref 39.0–52.0)
HEMOGLOBIN: 7.3 g/dL — AB (ref 13.0–17.0)
Hemoglobin: 7.4 g/dL — ABNORMAL LOW (ref 13.0–17.0)
Hemoglobin: 7.6 g/dL — ABNORMAL LOW (ref 13.0–17.0)
Hemoglobin: 8 g/dL — ABNORMAL LOW (ref 13.0–17.0)
LYMPHS ABS: 1.5 10*3/uL (ref 0.7–4.0)
LYMPHS ABS: 1.5 10*3/uL (ref 0.7–4.0)
LYMPHS PCT: 13 % (ref 12–46)
LYMPHS PCT: 11 % — AB (ref 12–46)
LYMPHS PCT: 12 % (ref 12–46)
Lymphocytes Relative: 13 % (ref 12–46)
Lymphs Abs: 1.1 10*3/uL (ref 0.7–4.0)
Lymphs Abs: 1.2 10*3/uL (ref 0.7–4.0)
MCH: 28.9 pg (ref 26.0–34.0)
MCH: 29.1 pg (ref 26.0–34.0)
MCH: 29.2 pg (ref 26.0–34.0)
MCH: 29.2 pg (ref 26.0–34.0)
MCHC: 31.3 g/dL (ref 30.0–36.0)
MCHC: 31.5 g/dL (ref 30.0–36.0)
MCHC: 31.7 g/dL (ref 30.0–36.0)
MCHC: 32 g/dL (ref 30.0–36.0)
MCV: 90.9 fL (ref 78.0–100.0)
MCV: 92 fL (ref 78.0–100.0)
MCV: 92.4 fL (ref 78.0–100.0)
MCV: 92.7 fL (ref 78.0–100.0)
MONO ABS: 1.1 10*3/uL — AB (ref 0.1–1.0)
MONOS PCT: 9 % (ref 3–12)
Monocytes Absolute: 0.9 10*3/uL (ref 0.1–1.0)
Monocytes Absolute: 1.2 10*3/uL — ABNORMAL HIGH (ref 0.1–1.0)
Monocytes Absolute: 1.4 10*3/uL — ABNORMAL HIGH (ref 0.1–1.0)
Monocytes Relative: 11 % (ref 3–12)
Monocytes Relative: 11 % (ref 3–12)
Monocytes Relative: 11 % (ref 3–12)
NEUTROS ABS: 8.4 10*3/uL — AB (ref 1.7–7.7)
NEUTROS ABS: 7.9 10*3/uL — AB (ref 1.7–7.7)
NEUTROS PCT: 71 % (ref 43–77)
NEUTROS PCT: 77 % (ref 43–77)
Neutro Abs: 7.2 10*3/uL (ref 1.7–7.7)
Neutro Abs: 8 10*3/uL — ABNORMAL HIGH (ref 1.7–7.7)
Neutrophils Relative %: 71 % (ref 43–77)
Neutrophils Relative %: 72 % (ref 43–77)
Platelets: 354 10*3/uL (ref 150–400)
Platelets: 387 10*3/uL (ref 150–400)
Platelets: 402 10*3/uL — ABNORMAL HIGH (ref 150–400)
Platelets: 404 10*3/uL — ABNORMAL HIGH (ref 150–400)
RBC: 2.5 MIL/uL — AB (ref 4.22–5.81)
RBC: 2.54 MIL/uL — ABNORMAL LOW (ref 4.22–5.81)
RBC: 2.63 MIL/uL — AB (ref 4.22–5.81)
RBC: 2.74 MIL/uL — AB (ref 4.22–5.81)
RDW: 16 % — ABNORMAL HIGH (ref 11.5–15.5)
RDW: 15.7 % — ABNORMAL HIGH (ref 11.5–15.5)
RDW: 15.9 % — AB (ref 11.5–15.5)
RDW: 16.2 % — AB (ref 11.5–15.5)
WBC: 10 10*3/uL (ref 4.0–10.5)
WBC: 10.3 10*3/uL (ref 4.0–10.5)
WBC: 11.3 10*3/uL — ABNORMAL HIGH (ref 4.0–10.5)
WBC: 12 10*3/uL — ABNORMAL HIGH (ref 4.0–10.5)

## 2014-05-22 LAB — PROTIME-INR
INR: 2.12 — ABNORMAL HIGH (ref 0.00–1.49)
PROTHROMBIN TIME: 23.7 s — AB (ref 11.6–15.2)

## 2014-05-22 LAB — BASIC METABOLIC PANEL
ANION GAP: 17 — AB (ref 5–15)
BUN: 32 mg/dL — ABNORMAL HIGH (ref 6–23)
CHLORIDE: 99 meq/L (ref 96–112)
CO2: 23 meq/L (ref 19–32)
CREATININE: 1.24 mg/dL (ref 0.50–1.35)
Calcium: 8.3 mg/dL — ABNORMAL LOW (ref 8.4–10.5)
GFR calc non Af Amer: 51 mL/min — ABNORMAL LOW (ref >90)
GFR, EST AFRICAN AMERICAN: 59 mL/min — AB (ref >90)
Glucose, Bld: 92 mg/dL (ref 70–99)
POTASSIUM: 5 meq/L (ref 3.7–5.3)
Sodium: 139 mEq/L (ref 137–147)

## 2014-05-22 LAB — TROPONIN I
Troponin I: <0.3 ng/mL (ref <0.30)
Troponin I: <0.3 ng/mL (ref <0.30)
Troponin I: <0.3 ng/mL (ref <0.30)

## 2014-05-22 LAB — MRSA PCR SCREENING: MRSA by PCR: NEGATIVE

## 2014-05-22 LAB — URINE MICROSCOPIC-ADD ON

## 2014-05-22 LAB — URINALYSIS, ROUTINE W REFLEX MICROSCOPIC
BILIRUBIN URINE: NEGATIVE
Glucose, UA: NEGATIVE mg/dL
KETONES UR: NEGATIVE mg/dL
NITRITE: NEGATIVE
PROTEIN: NEGATIVE mg/dL
Specific Gravity, Urine: 1.022 (ref 1.005–1.030)
UROBILINOGEN UA: 1 mg/dL (ref 0.0–1.0)
pH: 5 (ref 5.0–8.0)

## 2014-05-22 LAB — PRO B NATRIURETIC PEPTIDE: Pro B Natriuretic peptide (BNP): 3017 pg/mL — ABNORMAL HIGH (ref 0–450)

## 2014-05-22 LAB — PREPARE RBC (CROSSMATCH)

## 2014-05-22 MED ORDER — FUROSEMIDE 10 MG/ML IJ SOLN
INTRAMUSCULAR | Status: AC
  Administered 2014-05-22: 20 mg via INTRAVENOUS
  Filled 2014-05-22: qty 4

## 2014-05-22 MED ORDER — LORAZEPAM 2 MG/ML IJ SOLN
INTRAMUSCULAR | Status: AC
  Administered 2014-05-22: 1 mg via INTRAVENOUS
  Filled 2014-05-22: qty 1

## 2014-05-22 MED ORDER — BIOTENE DRY MOUTH MT LIQD
15.0000 mL | Freq: Two times a day (BID) | OROMUCOSAL | Status: DC
  Administered 2014-05-24 (×2): 15 mL via OROMUCOSAL

## 2014-05-22 MED ORDER — LORAZEPAM 2 MG/ML IJ SOLN
1.0000 mg | Freq: Once | INTRAMUSCULAR | Status: AC
  Administered 2014-05-22: 1 mg via INTRAVENOUS

## 2014-05-22 MED ORDER — FUROSEMIDE 10 MG/ML IJ SOLN
20.0000 mg | Freq: Once | INTRAMUSCULAR | Status: AC
  Administered 2014-05-22: 20 mg via INTRAVENOUS

## 2014-05-22 MED ORDER — LORAZEPAM 2 MG/ML IJ SOLN
1.0000 mg | Freq: Once | INTRAMUSCULAR | Status: AC | PRN
  Filled 2014-05-22: qty 1

## 2014-05-22 MED ORDER — CHLORHEXIDINE GLUCONATE 0.12 % MT SOLN
15.0000 mL | Freq: Two times a day (BID) | OROMUCOSAL | Status: DC
  Administered 2014-05-22 – 2014-05-25 (×6): 15 mL via OROMUCOSAL
  Filled 2014-05-22 (×9): qty 15

## 2014-05-22 NOTE — Progress Notes (Signed)
Utilization review completed.  

## 2014-05-22 NOTE — Consult Note (Signed)
CONSULT NOTE  Date: 05/22/2014               Patient Name:  Clayton [name] MRN: [id number]  DOB: 1926/07/19 Age / Sex: 80 y.o., male        PCP: [name] Primary Cardiologist: Burt Knack            Referring Physician: Dillard Essex              Reason for Consult:  mechanical aortic valve, supratheraputic INR levels.            History of Present Illness: Patient is a 80 y.o. male with a PMHx of diastolic CHF, dementia, mechanical aortic valve , who was admitted to [facility] on 05/21/2014 for evaluation of anemia and supratheraputic INR .  Been has a history of a mechanical aortic valve. He's been on chronic Coumadin. His INR levels are typically well controlled.  He recently had hip surgery. Since that time his by mouth intake has been markedly reduced and he has not had an appetite. Despite this, he has been receiving his normal dose of Coumadin.  He was admitted yesterday with severe anemia and a supratherapeutic INR. We are asked to comment on his super therapeutic INR.    Medications: Outpatient medications: Prescriptions prior to admission  Medication Sig Dispense Refill  . aspirin EC 81 MG tablet Take 81 mg by mouth daily.      . cyanocobalamin (,VITAMIN B-12,) 1000 MCG/ML injection 1000 mcg IM daily for 5 days, then weekly for 3 weeks, then monthly  1 mL  0  . donepezil (ARICEPT) 5 MG tablet Take 5 mg by mouth at bedtime.       . dorzolamide-timolol (COSOPT) 22.3-6.8 MG/ML ophthalmic solution Place 1 drop into both eyes 2 (two) times daily.       Marland Kitchen latanoprost (XALATAN) 0.005 % ophthalmic solution Place 1 drop into both eyes at bedtime.       Marland Kitchen lisinopril (PRINIVIL,ZESTRIL) 10 MG tablet Take 10 mg by mouth daily.        . meclizine (ANTIVERT) 25 MG tablet Take 25 mg by mouth daily as needed for dizziness or nausea.       . metoprolol (LOPRESSOR) 100 MG tablet Take 100 mg by mouth daily.       . metoprolol (LOPRESSOR) 50 MG tablet Take 50 mg by mouth at bedtime.      .  multivitamin-lutein (OCUVITE-LUTEIN) CAPS Take 1 capsule by mouth 2 (two) times daily.        Marland Kitchen omeprazole (PRILOSEC) 20 MG capsule Take 20 mg by mouth daily.        . polyethylene glycol (MIRALAX / GLYCOLAX) packet Take 17 g by mouth daily as needed.  14 each  0  . Tamsulosin HCl (FLOMAX) 0.4 MG CAPS Take 0.4 mg by mouth at bedtime.       Marland Kitchen warfarin (COUMADIN) 1 MG tablet Take 6-7 mg by mouth See admin instructions. Takes along with the 5mg  tablet to equal 7mg  on Sunday and Thursday.  6mg  all other days of the week.      . warfarin (COUMADIN) 5 MG tablet Take 6-7 mg by mouth See admin instructions. Takes along with the 1mg  tablet to equal 7mg  on Sunday and Thursday.  6mg  all other days of the week.      Marland Kitchen HYDROcodone-acetaminophen (NORCO/VICODIN) 5-325 MG per tablet Take 1-2 tablets by mouth every 4 (four) hours as needed for moderate pain.  30 tablet  0    Current medications: Current Facility-Administered Medications  Medication Dose Route Frequency Provider Last Rate Last Dose  . 0.9 %  sodium chloride infusion   Intravenous Continuous Shanda Howells, MD 30 mL/hr at 05/22/14 0200    . antiseptic oral rinse (BIOTENE) solution 15 mL  15 mL Mouth Rinse q12n4p David Tat, MD      . chlorhexidine (PERIDEX) 0.12 % solution 15 mL  15 mL Mouth Rinse BID Orson Eva, MD   15 mL at 05/22/14 0800  . donepezil (ARICEPT) tablet 5 mg  5 mg Oral QHS Shanda Howells, MD      . LORazepam (ATIVAN) injection 1 mg  1 mg Intravenous Once PRN Shanda Howells, MD      . sodium chloride 0.9 % injection 3 mL  3 mL Intravenous Q12H Shanda Howells, MD   3 mL at 05/22/14 0201  . tamsulosin (FLOMAX) capsule 0.4 mg  0.4 mg Oral QHS Shanda Howells, MD         Allergies  Allergen Reactions  . Tetanus Toxoids Hives     Past Medical History  Diagnosis Date  . CAD (coronary artery disease)     s/p CABG 1999  . HLD (hyperlipidemia)   . HTN (hypertension)   . GERD (gastroesophageal reflux disease)   . H/O hiatal hernia   .  Arthritis   . Peripheral vascular disease     aneursym  . Aortic aneurysm     at least 3 cmm infrarenal AAA by lumbar CT 05/22/13 [facility]); 41 mm proximal ascending aorta on echo 05/05/13 [facility])    Past Surgical History  Procedure Laterality Date  . Aortic valve replacement  1998  . Coronary artery bypass graft  1998  . Cholecystectomy    . Tonsillectomy    . US extremity*l* Left     arm  . Inguinal hernia repair  09/25/2013    with mesh  . Inguinal hernia repair Right 09/25/2013    Procedure: HERNIA REPAIR INGUINAL ADULT;  Surgeon: Imogene Burn. Georgette Dover, MD;  Location: [place];  Service: General;  Laterality: Right;  . Insertion of mesh Right 09/25/2013    Procedure: INSERTION OF MESH;  Surgeon: Imogene Burn. Georgette Dover, MD;  Location: [place];  Service: General;  Laterality: Right;  . Hand surgery Left     MULTIPLE   . Intramedullary (im) nail intertrochanteric Right 05/10/2014    Procedure: Open Reduction Internal Fixation Right Hip;  Surgeon: Mcarthur Rossetti, MD;  Location: [place];  Service: Orthopedics;  Laterality: Right;    Family History  Problem Relation Age of Onset  . Coronary artery disease      Social History:  reports that he has quit smoking. He has never used smokeless tobacco. He reports that he does not drink alcohol or use illicit drugs.   Review of Systems: Constitutional:  admits to  appetite change    HEENT: denies photophobia, eye pain, redness, hearing loss, ear pain, congestion, sore throat, rhinorrhea, sneezing, neck pain, neck stiffness and tinnitus.  Respiratory: denies SOB, DOE, cough, chest tightness, and wheezing.  Cardiovascular: denies chest pain, palpitations and leg swelling.  Gastrointestinal: denies nausea, vomiting, abdominal pain, diarrhea, constipation, blood in stool.  Genitourinary: denies dysuria, urgency, frequency, hematuria, flank pain and difficulty urinating.  Musculoskeletal: denies  myalgias, back pain, joint swelling,  arthralgias and gait problem.   Skin: denies pallor, rash and wound.  Neurological: denies dizziness, seizures, syncope, weakness, light-headedness, numbness and headaches.  Hematological: denies adenopathy, easy bruising, personal or family bleeding history.  Psychiatric/ Behavioral: denies suicidal ideation, mood changes, confusion, nervousness, sleep disturbance and agitation.    Physical Exam: BP 121/35  Pulse 83  Temp(Src) 98.4 F (36.9 C) (Oral)  Resp 18  Ht 5\' 8"  (1.727 m)  Wt 205 lb 0.4 oz (93 kg)  BMI 31.18 kg/m2  SpO2 100%  Wt Readings from Last 3 Encounters:  05/22/14 205 lb 0.4 oz (93 kg)  05/14/14 200 lb 9.6 oz (90.992 kg)  05/14/14 200 lb 9.6 oz (90.992 kg)    General: Vital signs reviewed and noted.  He is demented. He   Head: Normocephalic, atraumatic, sclera anicteric,   Neck: Supple. Negative for carotid bruits. No JVD   Lungs:  Clear bilaterally, no  wheezes, rales, or rhonchi. Breathing is normal   Heart: RRR with S1 , he is a mechanical S2 the   Abdomen:  Soft, non-tender, non-distended with normoactive bowel sounds. No hepatomegaly. No rebound/guarding. No obvious abdominal masses   MSK: No edema  Extremities:  has surgical wounds associated with right hip. There some swelling.   Neurologic:  very sleepy. He responded to his wife's request the   Psych:     Lab results: Basic Metabolic Panel:  Recent Labs Lab 05/22/14 0437  NA 139  K 5.0  CL 99  CO2 23  GLUCOSE 92  BUN 32*  CREATININE 1.24  CALCIUM 8.3*    Liver Function Tests: No results found for this basename: AST, ALT, ALKPHOS, BILITOT, PROT, ALBUMIN,  in the last 168 hours No results found for this basename: LIPASE, AMYLASE,  in the last 168 hours No results found for this basename: AMMONIA,  in the last 168 hours  CBC:  Recent Labs Lab 05/22/14 0015 05/22/14 0437 05/22/14 1514  WBC 11.3* 12.0* PENDING  NEUTROABS 8.0* 8.4* PENDING  HGB 7.6* 8.0* 7.4*  HCT 24.3* 25.4* 23.1*    MCV 92.4 92.7 90.9  PLT 402* 404* 387    Cardiac Enzymes:  Recent Labs Lab 05/22/14 0015 05/22/14 0437 05/22/14 0937  TROPONINI <0.30 <0.30 <0.30    BNP: No components found with this basename: POCBNP,   CBG: No results found for this basename: GLUCAP,  in the last 168 hours  Coagulation Studies:  Recent Labs  05/22/14 0015  LABPROT 23.7*  INR 2.12*     Other results:  Tele:  NSR    Imaging: Ct Head Wo Contrast  05/22/2014   CLINICAL DATA:  Confusion  EXAM: CT HEAD WITHOUT CONTRAST  TECHNIQUE: Contiguous axial images were obtained from the base of the skull through the vertex without intravenous contrast.  COMPARISON:  Prior CT from 05/06/2014  FINDINGS: Atrophy with chronic microvascular ischemic changes are stable from prior.  There is no acute intracranial hemorrhage or infarct. No mass lesion or midline shift. Gray-white matter differentiation is well maintained. Ventricles are normal in size without evidence of hydrocephalus. CSF containing spaces are within normal limits. No extra-axial fluid collection.  The calvarium is intact.  Orbital soft tissues are within normal limits.  The paranasal sinuses and mastoid air cells are well pneumatized and free of fluid.  Scalp soft tissues are unremarkable.  IMPRESSION: 1. No acute intracranial process. 2. Stable atrophy with moderate chronic microvascular ischemic disease.   Electronically Signed   By: Jeannine Boga M.D.   On: 05/22/2014 03:31       Assessment & Plan: . 1. Supratherapeutic INR: The patient has had a very  poor appetite since his hip surgery. Despite this, he has been receiving his normal dose of Coumadin. This explains his INR of 10. Because of his supra-therapeutic INR levels, he's had bleeding into his surgical hip wound.  He is a mechanical aortic valve it would be okay for him to hold his Coumadin for several days until the bleeding in his hip subsides.  I would restart his Coumadin as soon  as Ortho  gives the okay. Pharmacy to manage his INR levels. He will not need bridging with heparin or Lovenox.  Once Ortho gives the OK or when his anemia is stabilized , please order "Coumadin per pharmacy consult" .  2. Mechanical aortic valve:  OK to hold coumadin for several days to allow his hip to heal. Restart coumadin when OK .    3.  Metastatic prostate cancer:  Plans per Urology.    4. Dementia.     5. Anemia:  Plans per int. Med.    Thayer Headings, Brooke Bonito., MD, [facility] 05/22/2014, 4:33 PM Office - [phone] Pager [phone]

## 2014-05-22 NOTE — Progress Notes (Signed)
Patient ID: Clayton [name], male   DOB: 1925-11-17, 80 y.o.   MRN: [id number] I have seen and examined Clayton [name] and reviewed his chart.  He is post fixation of a pathologic proximal femur fracture and was re-admitted due to being anemic and supra-therapeutic on Coumadin.  A CT scan did show hematomas around his right femur surgical sites, which is to be expected given his INR and since he was on heparin IV around surgery and back on Coumadin afterwards.  He has also had very poor nutrician.  I examined his right femur and his incisions are intact with no evidence of infection.  They are weeping serous fluid only, more consistent with third-spacing.  I do not see a need for a surgical intervention from an orthopedic standpoint.  Will need dressing changes and re-enforcing his dressings 2-3 times daily.  He can still be up with weight bearing one his right leg when able.

## 2014-05-22 NOTE — Progress Notes (Signed)
[facility] PROGRESS NOTE  Clayton [name] MWN:[id number] DOB: 01-21-1926 DOA: 05/21/2014 PCP: Clayton [name], MD  Assessment/Plan: Anemia: ABLA in setting of supratherapeutic INR. 10.4 at [name]  -S/p vitamin K x2, FFP x1 and 2 units pRBCs and INR down to 2.1 today 7/14.  -For now Holding anticoagulation, antiplatelet>> given his mechanical valve I have consulted cardiology to assist with further recommendations on anticoagulation -Post surgical site is major concern but Noted serous drainage from site with dressing change by nursing which is reassuring>> have consulted orthopedics-Dr. Ninfa Linden to see further recommendations follow.  -It is noted that Hemoccult was trace positive positive at [facility] in the setting of INR of 10.4>> will repeat Hemoccult given his INR is down to 2.1 today and if remains positive>> followup and consult GI for further recommendations R hip: multiple hematomas on imaging.  -Per CT scan done at [facility] -R hip with hematomas in R gluteal musculature and proximal, medial R thigh- R LE CT shows hematoma in R adductor magnus and hematoma along R greater trochanter extending along post aspect of femoral neck.  -As above orthopedics consulted for further recommendations and Dr. Ninfa Linden to see today Aortic valve disease s/p mechanical valve/CHF:  -Hold anticoagulation secondary to above>> cardiology consulted to guide further anticoagulation -CHF compensated, continue to monitor I&Os>> he was -750 overnight Cycle CEs.  Metastatic prostate Ca: Has not yet followed up with heme Onc.  -Follow Dementia: cont aricept. Mild confusion on presentation.  -head CT negative for acute findings.  Leukocytosis: Obtained UA, chest x-Medardo and followup on cultures. BP stable today. -Follow and further treat accordingly pending results   Code Status: DO NOT RESUSCITATE Family Communication: Wife by phone Disposition Plan: Monitor in step down today   Consultants:  Orthopedic/Dr.  Ninfa Linden to see  Cardiology  Procedures:  None  Antibiotics:  None  HPI/Subjective: Patient sleepy but easily aroused, disoriented. No gross bleeding reported.  Objective: Filed Vitals:   05/22/14 0811  BP:   Pulse:   Temp: 98.2 F (36.8 C)  Resp:     Intake/Output Summary (Last 24 hours) at 05/22/14 0846 Last data filed at 05/22/14 4034  Gross per 24 hour  Intake    550 ml  Output   1300 ml  Net   -750 ml   Filed Weights   05/22/14 0305  Weight: 93 kg (205 lb 0.4 oz)    Exam:  General: Sleepy but easily aroused, disoriented In NAD Cardiovascular: Regular, nl S1 s2 Respiratory: Decreased breath sounds at the bases, no crackles or wheezes Abdomen: soft +BS NT/ND, no masses palpable Extremities: No cyanosis and no edema. Dressing with serous drainage     Data Reviewed: Basic Metabolic Panel:  Recent Labs Lab 05/22/14 0437  NA 139  K 5.0  CL 99  CO2 23  GLUCOSE 92  BUN 32*  CREATININE 1.24  CALCIUM 8.3*   Liver Function Tests: No results found for this basename: AST, ALT, ALKPHOS, BILITOT, PROT, ALBUMIN,  in the last 168 hours No results found for this basename: LIPASE, AMYLASE,  in the last 168 hours No results found for this basename: AMMONIA,  in the last 168 hours CBC:  Recent Labs Lab 05/22/14 0015 05/22/14 0437  WBC 11.3* 12.0*  NEUTROABS 8.0* 8.4*  HGB 7.6* 8.0*  HCT 24.3* 25.4*  MCV 92.4 92.7  PLT 402* 404*   Cardiac Enzymes:  Recent Labs Lab 05/22/14 0015 05/22/14 0437  TROPONINI <0.30 <0.30   BNP (last 3 results)  Recent Labs  05/12/14 1817 05/14/14 0434 05/22/14 0015  PROBNP 2081.0* 1564.0* 3017.0*   CBG: No results found for this basename: GLUCAP,  in the last 168 hours  Recent Results (from the past 240 hour(s))  MRSA PCR SCREENING     Status: None   Collection Time    05/21/14 10:12 PM      Result Value Ref Range Status   MRSA by PCR NEGATIVE  NEGATIVE Final   Comment:            The GeneXpert MRSA  Assay (FDA     approved for NASAL specimens     only), is one component of a     comprehensive MRSA colonization     surveillance program. It is not     intended to diagnose MRSA     infection nor to guide or     monitor treatment for     MRSA infections.     Studies: Ct Head Wo Contrast  05/22/2014   CLINICAL DATA:  Confusion  EXAM: CT HEAD WITHOUT CONTRAST  TECHNIQUE: Contiguous axial images were obtained from the base of the skull through the vertex without intravenous contrast.  COMPARISON:  Prior CT from 05/06/2014  FINDINGS: Atrophy with chronic microvascular ischemic changes are stable from prior.  There is no acute intracranial hemorrhage or infarct. No mass lesion or midline shift. Gray-white matter differentiation is well maintained. Ventricles are normal in size without evidence of hydrocephalus. CSF containing spaces are within normal limits. No extra-axial fluid collection.  The calvarium is intact.  Orbital soft tissues are within normal limits.  The paranasal sinuses and mastoid air cells are well pneumatized and free of fluid.  Scalp soft tissues are unremarkable.  IMPRESSION: 1. No acute intracranial process. 2. Stable atrophy with moderate chronic microvascular ischemic disease.   Electronically Signed   By: Jeannine Boga M.D.   On: 05/22/2014 03:31    Scheduled Meds: . antiseptic oral rinse  15 mL Mouth Rinse q12n4p  . chlorhexidine  15 mL Mouth Rinse BID  . donepezil  5 mg Oral QHS  . sodium chloride  3 mL Intravenous Q12H  . tamsulosin  0.4 mg Oral QHS   Continuous Infusions: . sodium chloride 30 mL/hr at 05/22/14 0200    Active Problems:   Anemia    Time spent: [place] [facility] Pager [phone]. If 7PM-7AM, please contact night-coverage at [email or web address], password [facility] 05/22/2014, 8:46 AM  LOS: 1 day

## 2014-05-22 NOTE — Progress Notes (Deleted)
Utilization review completed.  

## 2014-05-23 ENCOUNTER — Encounter (HOSPITAL_COMMUNITY): Payer: Self-pay | Admitting: *Deleted

## 2014-05-23 LAB — CBC WITH DIFFERENTIAL/PLATELET
Basophils Absolute: 0 10*3/uL (ref 0.0–0.1)
Basophils Absolute: 0 10*3/uL (ref 0.0–0.1)
Basophils Relative: 0 % (ref 0–1)
Basophils Relative: 0 % (ref 0–1)
EOS ABS: 0.2 10*3/uL (ref 0.0–0.7)
EOS PCT: 2 % (ref 0–5)
Eosinophils Absolute: 0.2 10*3/uL (ref 0.0–0.7)
Eosinophils Relative: 2 % (ref 0–5)
HCT: 24.5 % — ABNORMAL LOW (ref 39.0–52.0)
HEMATOCRIT: 23.8 % — AB (ref 39.0–52.0)
HEMOGLOBIN: 7.5 g/dL — AB (ref 13.0–17.0)
Hemoglobin: 7.8 g/dL — ABNORMAL LOW (ref 13.0–17.0)
LYMPHS ABS: 1.2 10*3/uL (ref 0.7–4.0)
LYMPHS ABS: 1.1 10*3/uL (ref 0.7–4.0)
Lymphocytes Relative: 11 % — ABNORMAL LOW (ref 12–46)
Lymphocytes Relative: 11 % — ABNORMAL LOW (ref 12–46)
MCH: 29.6 pg (ref 26.0–34.0)
MCH: 30 pg (ref 26.0–34.0)
MCHC: 31.5 g/dL (ref 30.0–36.0)
MCHC: 31.8 g/dL (ref 30.0–36.0)
MCV: 94.1 fL (ref 78.0–100.0)
MCV: 94.2 fL (ref 78.0–100.0)
MONO ABS: 1.1 10*3/uL — AB (ref 0.1–1.0)
MONOS PCT: 11 % (ref 3–12)
Monocytes Absolute: 1.1 10*3/uL — ABNORMAL HIGH (ref 0.1–1.0)
Monocytes Relative: 11 % (ref 3–12)
NEUTROS PCT: 76 % (ref 43–77)
Neutro Abs: 8.2 10*3/uL — ABNORMAL HIGH (ref 1.7–7.7)
Neutro Abs: 8.3 10*3/uL — ABNORMAL HIGH (ref 1.7–7.7)
Neutrophils Relative %: 76 % (ref 43–77)
PLATELETS: 381 10*3/uL (ref 150–400)
Platelets: 373 10*3/uL (ref 150–400)
RBC: 2.53 MIL/uL — ABNORMAL LOW (ref 4.22–5.81)
RBC: 2.6 MIL/uL — AB (ref 4.22–5.81)
RDW: 15.7 % — ABNORMAL HIGH (ref 11.5–15.5)
RDW: 15.7 % — ABNORMAL HIGH (ref 11.5–15.5)
WBC: 10.8 10*3/uL — ABNORMAL HIGH (ref 4.0–10.5)
WBC: 10.8 10*3/uL — ABNORMAL HIGH (ref 4.0–10.5)

## 2014-05-23 LAB — BASIC METABOLIC PANEL
ANION GAP: 13 (ref 5–15)
BUN: 24 mg/dL — AB (ref 6–23)
CALCIUM: 8.1 mg/dL — AB (ref 8.4–10.5)
CO2: 26 mEq/L (ref 19–32)
CREATININE: 1.15 mg/dL (ref 0.50–1.35)
Chloride: 101 mEq/L (ref 96–112)
GFR, EST AFRICAN AMERICAN: 65 mL/min — AB (ref >90)
GFR, EST NON AFRICAN AMERICAN: 56 mL/min — AB (ref >90)
Glucose, Bld: 100 mg/dL — ABNORMAL HIGH (ref 70–99)
Potassium: 4.5 mEq/L (ref 3.7–5.3)
Sodium: 140 mEq/L (ref 137–147)

## 2014-05-23 LAB — PROTIME-INR
INR: 1.59 — ABNORMAL HIGH (ref 0.00–1.49)
Prothrombin Time: 19 seconds — ABNORMAL HIGH (ref 11.6–15.2)

## 2014-05-23 LAB — URINE CULTURE
COLONY COUNT: NO GROWTH
Culture: NO GROWTH

## 2014-05-23 LAB — PREPARE RBC (CROSSMATCH)

## 2014-05-23 MED ORDER — METOPROLOL TARTRATE 100 MG PO TABS
100.0000 mg | ORAL_TABLET | Freq: Every day | ORAL | Status: DC
  Filled 2014-05-23: qty 1

## 2014-05-23 MED ORDER — CYANOCOBALAMIN 1000 MCG/ML IJ SOLN: 1000.0000 ug | INTRAMUSCULAR | Status: DC

## 2014-05-23 MED ORDER — POLYETHYLENE GLYCOL 3350 17 G PO PACK
17.0000 g | PACK | Freq: Every day | ORAL | Status: DC | PRN
  Filled 2014-05-23: qty 1

## 2014-05-23 MED ORDER — DOCUSATE SODIUM 100 MG PO CAPS
100.0000 mg | ORAL_CAPSULE | Freq: Two times a day (BID) | ORAL | Status: DC
  Administered 2014-05-23 – 2014-05-24 (×2): 100 mg via ORAL
  Filled 2014-05-23: qty 1

## 2014-05-23 MED ORDER — DORZOLAMIDE HCL-TIMOLOL MAL 2-0.5 % OP SOLN
1.0000 [drp] | Freq: Two times a day (BID) | OPHTHALMIC | Status: DC
  Administered 2014-05-23 – 2014-05-25 (×4): 1 [drp] via OPHTHALMIC
  Filled 2014-05-23: qty 10

## 2014-05-23 MED ORDER — METOPROLOL TARTRATE 50 MG PO TABS
50.0000 mg | ORAL_TABLET | Freq: Every day | ORAL | Status: DC
  Administered 2014-05-23 – 2014-05-24 (×2): 50 mg via ORAL
  Filled 2014-05-23 (×3): qty 1

## 2014-05-23 MED ORDER — METOPROLOL TARTRATE 100 MG PO TABS
100.0000 mg | ORAL_TABLET | Freq: Every day | ORAL | Status: DC
  Administered 2014-05-24 – 2014-05-25 (×2): 100 mg via ORAL
  Filled 2014-05-23 (×2): qty 1

## 2014-05-23 MED ORDER — OCUVITE-LUTEIN PO CAPS
1.0000 | ORAL_CAPSULE | Freq: Two times a day (BID) | ORAL | Status: DC
  Administered 2014-05-23 – 2014-05-25 (×4): 1 via ORAL
  Filled 2014-05-23 (×5): qty 1

## 2014-05-23 MED ORDER — LATANOPROST 0.005 % OP SOLN
1.0000 [drp] | Freq: Every day | OPHTHALMIC | Status: DC
  Administered 2014-05-23 – 2014-05-24 (×2): 1 [drp] via OPHTHALMIC
  Filled 2014-05-23: qty 2.5

## 2014-05-23 NOTE — Progress Notes (Signed)
OT Cancellation Note  Patient Details Name: Clayton [name] MRN: [id number] DOB: November 24, 1925   Cancelled Treatment:    Reason Eval/Treat Not Completed: Other (comment) Pt is Medicare/Medicaid and current D/C plan is SNF. No apparent immediate acute care OT needs, therefore will defer OT to SNF. If OT eval is needed please call Acute Rehab Dept. at [phone] or text page OT at [phone].  [place], OTR/L  [phone] 05/23/2014 05/23/2014, 3:29 PM

## 2014-05-23 NOTE — Evaluation (Signed)
Physical Therapy Evaluation Patient Details Name: Clayton [name] MRN: [id number] DOB: 06-23-1926 Today's Date: 05/23/2014   History of Present Illness  Pt admitted from [facility] 7/13 with anemia. Pt s/p R IM nail to hip back in June 28, R LE WBAT. Pt legally blind.  Clinical Impression  Pt tolerated OOB mobility well however con't to required assist x 2 for mobility. Pt remains to have R LE weakness and decreased ROM however is tolerating <50% WBing. Pt to con't to benefit from ST-SNF upon d/c to maximize functional recovery.    Follow Up Recommendations SNF    Equipment Recommendations  None recommended by PT    Recommendations for Other Services       Precautions / Restrictions Precautions Precautions: Fall Precaution Comments: confusion, legally blind Restrictions Weight Bearing Restrictions: Yes RLE Weight Bearing: Weight bearing as tolerated      Mobility  Bed Mobility Overal bed mobility: Needs Assistance Bed Mobility: Rolling;Sidelying to Sit Rolling: Min assist (to the L) Sidelying to sit: Mod assist       General bed mobility comments: assist with LEs, minimally with trunk elevation  Transfers Overall transfer level: Needs assistance Equipment used:  (2 person transfer with gait belt) Transfers: Sit to/from [facility] Sit to Stand: Mod assist;+2 physical assistance Stand pivot transfers: Mod assist;+2 physical assistance       General transfer comment: pt able to take steps to chair with max directional v/c's. pt held onto back of arms of PT and RN and did well  Ambulation/Gait                Stairs            Wheelchair Mobility    Modified Rankin (Stroke Patients Only)       Balance Overall balance assessment: Needs assistance Sitting-balance support: Single extremity supported Sitting balance-Leahy Scale: Poor     Standing balance support: Bilateral upper extremity supported Standing balance-Leahy Scale: Poor                                Pertinent Vitals/Pain Reports of R hip pain with mobility, HR into 140s during transfer    [place] expects to be discharged to:: Skilled nursing facility                 Additional Comments: pt at [facility] rehab SNF for rehab x 1 week, pt poor historian due to confusion    Prior Function Level of Independence: Needs assistance   Gait / Transfers Assistance Needed: pt working with PT           Hand Dominance   Dominant Hand: Right    Extremity/Trunk Assessment           LUE Deficits / Details: limited L UE function due to broken arm at 12 yrs   Lower Extremity Assessment: RLE deficits/detail RLE Deficits / Details: limited hip ROM and strength due to recent surgery, able to WB    Cervical / Trunk Assessment: Kyphotic  Communication   Communication: HOH (legally blind)  Cognition Arousal/Alertness: Awake/alert Behavior During Therapy: WFL for tasks assessed/performed Overall Cognitive Status: Impaired/Different from baseline (pt confused) Area of Impairment: Orientation;Awareness;Problem solving Orientation Level: Disoriented to;Place;Time;Situation   Memory: Decreased recall of precautions     Awareness: Intellectual Problem Solving: Requires verbal cues;Requires tactile cues      General Comments      Exercises  Assessment/Plan    PT Assessment Patient needs continued PT services  PT Diagnosis Difficulty walking;Acute pain   PT Problem List Decreased strength;Decreased activity tolerance;Decreased balance;Decreased mobility;Decreased knowledge of use of DME;Pain;Decreased range of motion  PT Treatment Interventions DME instruction;Gait training;Functional mobility training;Therapeutic activities;Therapeutic exercise;Balance training;Neuromuscular re-education;Patient/family education;Modalities   PT Goals (Current goals can be found in the Care Plan section) Acute Rehab PT  Goals Patient Stated Goal: None stated.   PT Goal Formulation: Patient unable to participate in goal setting Time For Goal Achievement: 06/06/14 Potential to Achieve Goals: Fair    Frequency Min 3X/week   Barriers to discharge        Co-evaluation               End of Session Equipment Utilized During Treatment: Gait belt Activity Tolerance:  (pt became tachycardic in 140s) Patient left: in chair;with call bell/phone within reach;with family/visitor present Nurse Communication: Mobility status         Time: 8413-2440 PT Time Calculation (min): 20 min   Charges:     PT Treatments $Therapeutic Activity: 8-22 mins   PT G CodesKingsley Callander 05/23/2014, 9:36 AM  Kittie Plater, PT, DPT Pager #: [phone] Office #: [phone]

## 2014-05-23 NOTE — Progress Notes (Signed)
[place] TEAM 1 - Stepdown/ICU TEAM Progress Note  Clayton [name] BTD:[id number] DOB: 1926-06-15 DOA: 05/21/2014 PCP: [name], MD  Admit HPI / Brief Narrative: 80 yo M with hx/o diastolic CHF, dementia, aortic valve disorder s/p mechanical aortic valve on coumadin, dementia, recent admission for closed fracture of R hip s/p ORIF 05/10/2014, and acute blood loss anemia who was discharged to [facility]. Per report, patient had been becoming progressively weaker and pale appearing, and was found to have a Hgb around 5. At [facility] his hemoglobin was 5.8, INR 10.4, white blood cell count 11.3, creatinine 1.4. Hemoccult was trace positive. Urinalysis with trace blood. He was given 2 units of blood at [facility]. Was also given one unit of FFP as well as 2 rounds of vitamin K. A CT abd and pelvis/hip @ [place] noted post operative changes in R hip with hematomas in R gluteal musculature and proximal, medial R thigh - R LE CT noted hematoma in R adductor magnus and hematoma along R greater trochanter extending along post aspect of femoral neck.   HPI/Subjective: Pt is sleeping comfortably.  He is unable to provide a hx.  His wife states he did not sleep last night, but that otherwise he "seems to be doing fine now."  Assessment/Plan:  Acute blood loss anemia S/p vitamin K x2, FFP x1 and 2 units pRBCs - goal Hgb should be 8 given hx of CAD/CABG - transfuse 1 additional unit PRBC today   Coagulopathy due to coumadin Holding anticoag for now - will need frequent INR checks when coumadin resumed - see recs per Cards  Multifocal R LE hematomas Due to coagulopathy in setting of recent ORIF - follow clinically   Recent R hip fx s/p ORIF 05/10/2014 As per Ortho   AoVR w/ mechanical valve See anticoag recs per Cards  CAD s/p CABG 1999  Metastatic prostate CA For outpt eval   Dementia  Code Status: DNR/NO CODE  Family Communication: spoke w/ wife at bedside at length    Disposition Plan: SDU - eventual d/c to SNF for ongoing rehab - facility will have to be capable of following and titrating coumadin dosing   Consultants: Ortho Ninfa Linden Cards - [place]  Procedures: none  Antibiotics: none  DVT prophylaxis: SCDs only for now  Objective: Blood pressure 141/62, pulse 95, temperature 99.1 F (37.3 C), temperature source Oral, resp. rate 26, height 5\' 8"  (1.727 m), weight 93 kg (205 lb 0.4 oz), SpO2 98.00%.  Intake/Output Summary (Last 24 hours) at 05/23/14 1533 Last data filed at 05/23/14 1230  Gross per 24 hour  Intake    510 ml  Output   1175 ml  Net   -665 ml   Exam: General: sleeping/lethargic - confused - no apparent discomfort Lungs: Clear to auscultation bilaterally without wheezes or crackles Cardiovascular: Regular rate and rhythm without murmur gallop or rub  Abdomen: Nontender, nondistended, soft, bowel sounds positive, no rebound, no ascites, no appreciable mass Extremities: No significant cyanosis, clubbing; 1+ edema R LE   Data Reviewed: Basic Metabolic Panel:  Recent Labs Lab 05/22/14 0437 05/23/14 0615  NA 139 140  K 5.0 4.5  CL 99 101  CO2 23 26  GLUCOSE 92 100*  BUN 32* 24*  CREATININE 1.24 1.15  CALCIUM 8.3* 8.1*   Liver Function Tests: No results found for this basename: AST, ALT, ALKPHOS, BILITOT, PROT, ALBUMIN,  in the last 168 hours  CBC:  Recent Labs Lab 05/22/14 0015 05/22/14 0437  05/22/14 1514 05/22/14 2220 05/23/14 0615  WBC 11.3* 12.0* 10.0 10.3 10.8*  NEUTROABS 8.0* 8.4* 7.2 7.9* 8.2*  HGB 7.6* 8.0* 7.4* 7.3* 7.5*  HCT 24.3* 25.4* 23.1* 23.0* 23.8*  MCV 92.4 92.7 90.9 92.0 94.1  PLT 402* 404* 387 354 373   Cardiac Enzymes:  Recent Labs Lab 05/22/14 0015 05/22/14 0437 05/22/14 0937  TROPONINI <0.30 <0.30 <0.30   BNP (last 3 results)  Recent Labs  05/12/14 1817 05/14/14 0434 05/22/14 0015  PROBNP 2081.0* 1564.0* 3017.0*    Recent Results (from the past 240 hour(s))   MRSA PCR SCREENING     Status: None   Collection Time    05/21/14 10:12 PM      Result Value Ref Range Status   MRSA by PCR NEGATIVE  NEGATIVE Final   Comment:            The GeneXpert MRSA Assay (FDA     approved for NASAL specimens     only), is one component of a     comprehensive MRSA colonization     surveillance program. It is not     intended to diagnose MRSA     infection nor to guide or     monitor treatment for     MRSA infections.  URINE CULTURE     Status: None   Collection Time    05/22/14  2:05 AM      Result Value Ref Range Status   Specimen Description URINE, CLEAN CATCH   Final   Special Requests NONE   Final   Culture  Setup Time     Final   Value: 05/22/2014 02:26     Performed at [facility] Count     Final   Value: NO GROWTH     Performed at [facility]   Culture     Final   Value: NO GROWTH     Performed at [facility]   Report Status 05/23/2014 FINAL   Final     Studies:  Recent x-Josiel studies have been reviewed in detail by the Attending Physician  Scheduled Meds:  Scheduled Meds: . antiseptic oral rinse  15 mL Mouth Rinse q12n4p  . chlorhexidine  15 mL Mouth Rinse BID  . donepezil  5 mg Oral QHS  . sodium chloride  3 mL Intravenous Q12H  . tamsulosin  0.4 mg Oral QHS    Time spent on care of this patient: 35 mins   Brendalyn Vallely T , MD   [facility] Office  [phone] Pager - Text Page per Shea Evans as per below:  On-Call/Text Page:      [email or web address]      password [email or web address]  If 7PM-7AM, please contact night-coverage [email or web address] Password [facility] 05/23/2014, 3:33 PM   LOS: 2 days

## 2014-05-24 DIAGNOSIS — I6200 Nontraumatic subdural hemorrhage, unspecified: Secondary | ICD-10-CM

## 2014-05-24 DIAGNOSIS — C61 Malignant neoplasm of prostate: Secondary | ICD-10-CM

## 2014-05-24 DIAGNOSIS — F0390 Unspecified dementia without behavioral disturbance: Secondary | ICD-10-CM

## 2014-05-24 DIAGNOSIS — I5033 Acute on chronic diastolic (congestive) heart failure: Secondary | ICD-10-CM

## 2014-05-24 DIAGNOSIS — H547 Unspecified visual loss: Secondary | ICD-10-CM

## 2014-05-24 DIAGNOSIS — C7951 Secondary malignant neoplasm of bone: Secondary | ICD-10-CM

## 2014-05-24 DIAGNOSIS — C7952 Secondary malignant neoplasm of bone marrow: Secondary | ICD-10-CM

## 2014-05-24 DIAGNOSIS — Z951 Presence of aortocoronary bypass graft: Secondary | ICD-10-CM

## 2014-05-24 LAB — COMPREHENSIVE METABOLIC PANEL
ALBUMIN: 2.2 g/dL — AB (ref 3.5–5.2)
ALT: 62 U/L — ABNORMAL HIGH (ref 0–53)
AST: 87 U/L — ABNORMAL HIGH (ref 0–37)
Alkaline Phosphatase: 176 U/L — ABNORMAL HIGH (ref 39–117)
Anion gap: 11 (ref 5–15)
BUN: 19 mg/dL (ref 6–23)
CALCIUM: 8.1 mg/dL — AB (ref 8.4–10.5)
CO2: 28 mEq/L (ref 19–32)
CREATININE: 1.08 mg/dL (ref 0.50–1.35)
Chloride: 101 mEq/L (ref 96–112)
GFR calc non Af Amer: 61 mL/min — ABNORMAL LOW (ref >90)
GFR, EST AFRICAN AMERICAN: 70 mL/min — AB (ref >90)
GLUCOSE: 108 mg/dL — AB (ref 70–99)
Potassium: 4.7 mEq/L (ref 3.7–5.3)
Sodium: 140 mEq/L (ref 137–147)
TOTAL PROTEIN: 6 g/dL (ref 6.0–8.3)
Total Bilirubin: 1.8 mg/dL — ABNORMAL HIGH (ref 0.3–1.2)

## 2014-05-24 LAB — CBC
HCT: 27 % — ABNORMAL LOW (ref 39.0–52.0)
Hemoglobin: 8.5 g/dL — ABNORMAL LOW (ref 13.0–17.0)
MCH: 29.3 pg (ref 26.0–34.0)
MCHC: 31.5 g/dL (ref 30.0–36.0)
MCV: 93.1 fL (ref 78.0–100.0)
Platelets: 358 10*3/uL (ref 150–400)
RBC: 2.9 MIL/uL — AB (ref 4.22–5.81)
RDW: 16.1 % — ABNORMAL HIGH (ref 11.5–15.5)
WBC: 11.3 10*3/uL — ABNORMAL HIGH (ref 4.0–10.5)

## 2014-05-24 LAB — PROTIME-INR
INR: 1.43 (ref 0.00–1.49)
PROTHROMBIN TIME: 17.5 s — AB (ref 11.6–15.2)

## 2014-05-24 NOTE — Clinical Social Work Psychosocial (Signed)
Clinical Social Work Department BRIEF PSYCHOSOCIAL ASSESSMENT 05/24/2014  Patient:  Clayton [name], Clayton [name]     Account Number:  [id number]     Admit date:  05/21/2014  Clinical Social Worker:  Lovey Newcomer  Date/Time:  05/24/2014 11:48 AM  Referred by:  Physician  Date Referred:  05/24/2014 Referred for  SNF Placement   Other Referral:   Interview type:  Family Other interview type:   Wife interviewed at bedside.    PSYCHOSOCIAL DATA Living Status:  WIFE Admitted from facility:  [place] Level of care:  [place] Primary support name:  Clayton [name] Primary support relationship to patient:  SPOUSE Degree of support available:   Support is good.    CURRENT CONCERNS Current Concerns  Post-Acute Placement   Other Concerns:    SOCIAL WORK ASSESSMENT / PLAN CSW met with wife Clayton [name] at bedside to complete assessment. Clayton [name] reports that the patient has been admitted from [facility] [employer] and she is agreeable to the patient returning to SNF at discharge. Clayton [name] states that she would like for the patient to return to [facility] at discharge if possible, but would be agreeable to patient returning to [facility] if necessary. CSW explained SNF search and placement process to wife and answered questions. Wife reports that patient was pretty independent prior to going to [facility]. Wife reports that the patient is blind.   Assessment/plan status:  Psychosocial Support/Ongoing Assessment of Needs Other assessment/ plan:   Complete Fl2, Fax, PASRR   Information/referral to community resources:   CSW contact information and SNF list given.    PATIENT'S/FAMILY'S RESPONSE TO PLAN OF CARE: Patient's wife Clayton [name] agreeable to SNF placement at discharge. CSW will follow up with bed offers.       Liz Beach MSW, [place], [place], [phone]

## 2014-05-24 NOTE — Progress Notes (Signed)
Clayton [name] - Stepdown/ICU TEAM Progress Note  Clayton [name] Clayton [name] DOB: 1926/08/28 DOA: 05/21/2014 PCP: Clayton [name], MD  Admit HPI / Brief Narrative: 80 yo WM PMHx Diastolic CHF, dementia, S/P St[employer] Jude mechanical aortic valve replacement in 1999 with concomitant CABG on coumadin, dementia, recent admission for closed fracture of R hip s/p ORIF 05/10/2014, and acute blood loss anemia who was discharged to [facility]. Per report, patient had been becoming progressively weaker and pale appearing, and was found to have a Hgb around 5. At [facility] his hemoglobin was 5.8, INR 10.4, white blood cell count 11.3, creatinine [name].4. Hemoccult was trace positive. Urinalysis with trace blood. He was given 2 units of blood at [facility]. Was also given one unit of FFP as well as 2 rounds of vitamin K. A CT abd and pelvis/hip @ [facility] noted post operative changes in R hip with hematomas in R gluteal musculature and proximal, medial R thigh - R LE CT noted hematoma in R adductor magnus and hematoma along R greater trochanter extending along post aspect of femoral neck.    HPI/Subjective: 7/16 A./O. x4, no complaints other than fatigue from patient.    Assessment/Plan: Acute blood loss anemia  -S/p vitamin K x2, FFP x1 and 3 units pRBCs - goal Hgb should be 8 given hx of CAD/CABG -Hemoglobin stable today, continue to monitor  Coagulopathy due to coumadin  Holding anticoag for now, will speak with Dr. [name] (cardiology) in the a.m. to discuss length of time patient needs to be off Coumadin, 2 weeks?   Multifocal R LE hematomas  -Due to coagulopathy in setting of recent ORIF - follow clinically  -Continue to hold all anticoagulants  Recent R hip fx s/p ORIF 05/10/2014  As per Ortho   AoVR w/ mechanical valve  -Contact Dr. [name] (cardiology) to talk specific timeframe concerning restarting anticoagulant    CAD s/p CABG 1999  -Stable negative chest  pain/SOB  Metastatic prostate CA  For outpt eval   Dementia  -At baseline per wife     Code Status: DNR/NO CODE  Family Communication: spoke w/ wife at bedside at length  Disposition Plan: SDU - eventual d/c to SNF for ongoing rehab - facility will have to be capable of following and titrating coumadin dosing     Consultants: Dr. Jean Rosenthal (orthopedic surgery) Dr. Alfonzo Beers (cardiology)  Procedure/Significant Events:    Culture NA  Antibiotics: NA  DVT prophylaxis: SCDs only for now    Devices NA   LINES / TUBES:  7/15 22ga right arm    Continuous Infusions: . sodium chloride 10 mL/hr at 05/24/14 0000    Objective: VITAL SIGNS: Temp: 98.7 F (37.[name] C) (07/16 0700) Temp src: Axillary (07/16 0700) BP: 144/54 mmHg (07/16 0703) Pulse Rate: 67 (07/16 0703) SPO2; FIO2:   Intake/Output Summary (Last 24 hours) at 05/24/14 0850 Last data filed at 05/24/14 0800  Gross per 24 hour  Intake 1011.09 ml  Output    575 ml  Net 436.09 ml     Exam: General: A./O. x4, NAD, No acute respiratory distress Lungs: Clear to auscultation bilaterally without wheezes or crackles Cardiovascular: Regular rate and rhythm without murmur gallop or rub normal S1 and S2 Abdomen: Nontender, nondistended, soft, bowel sounds positive, no rebound, no ascites, no appreciable mass Extremities: No significant cyanosis, clubbing, or edema bilateral lower extremities  Data Reviewed: Basic Metabolic Panel:  Recent Labs Lab 05/22/14 0437 05/23/14 0615 05/24/14 0357  NA 139 140 140  K 5.0 4.5 4.7  CL 99 101 101  CO2 23 26 28   GLUCOSE 92 100* 108*  BUN 32* 24* 19  CREATININE [name].24 [name].15 [name].08  CALCIUM 8.3* 8.[name]* 8.[name]*   Liver Function Tests:  Recent Labs Lab 05/24/14 0357  AST 87*  ALT 62*  ALKPHOS 176*  BILITOT [name].8*  PROT 6.0  ALBUMIN 2.2*   No results found for this basename: LIPASE, AMYLASE,  in the last 168 hours No results found for this  basename: AMMONIA,  in the last 168 hours CBC:  Recent Labs Lab 05/22/14 0437 05/22/14 1514 05/22/14 2220 05/23/14 0615 05/23/14 1435 05/24/14 0357  WBC 12.0* 10.0 10.3 10.8* 10.8* 11.3*  NEUTROABS 8.4* 7.2 7.9* 8.2* 8.3*  --   HGB 8.0* 7.4* 7.3* 7.5* 7.8* 8.5*  HCT 25.4* 23.[name]* 23.0* 23.8* 24.5* 27.0*  MCV 92.7 90.9 92.0 94.[name] 94.2 93.[name]  PLT 404* 387 354 373 381 358   Cardiac Enzymes:  Recent Labs Lab 05/22/14 0015 05/22/14 0437 05/22/14 0937  TROPONINI <0.30 <0.30 <0.30   BNP (last 3 results)  Recent Labs  05/12/14 1817 05/14/14 0434 05/22/14 0015  PROBNP 2081.0* 1564.0* 3017.0*   CBG: No results found for this basename: GLUCAP,  in the last 168 hours  Recent Results (from the past 240 hour(s))  MRSA PCR SCREENING     Status: None   Collection Time    05/21/14 10:12 PM      Result Value Ref Range Status   MRSA by PCR NEGATIVE  NEGATIVE Final   Comment:            The GeneXpert MRSA Assay (FDA     approved for NASAL specimens     only), is one component of a     comprehensive MRSA colonization     surveillance program. It is not     intended to diagnose MRSA     infection nor to guide or     monitor treatment for     MRSA infections.  URINE CULTURE     Status: None   Collection Time    05/22/14  2:05 AM      Result Value Ref Range Status   Specimen Description URINE, CLEAN CATCH   Final   Special Requests NONE   Final   Culture  Setup Time     Final   Value: 05/22/2014 02:26     Performed at [facility] Count     Final   Value: NO GROWTH     Performed at [facility]   Culture     Final   Value: NO GROWTH     Performed at [facility]   Report Status 05/23/2014 FINAL   Final     Studies:  Recent x-Toren studies have been reviewed in detail by the Attending Physician  Scheduled Meds:  Scheduled Meds: . antiseptic oral rinse  15 mL Mouth Rinse q12n4p  . chlorhexidine  15 mL Mouth Rinse BID  . [START ON  05/26/2014] cyanocobalamin  [name],000 mcg Intramuscular Q7 days  . [START ON 9/[name]/2015] cyanocobalamin  [name],000 mcg Intramuscular Q30 days  . docusate sodium  100 mg Oral BID  . donepezil  5 mg Oral QHS  . dorzolamide-timolol  [name] drop Both Eyes BID  . latanoprost  [name] drop Both Eyes QHS  . metoprolol  100 mg Oral Daily  . metoprolol  50 mg Oral QHS  . multivitamin-lutein  [name] capsule Oral BID  . sodium chloride  3 mL Intravenous Q12H  . tamsulosin  0.4 mg Oral QHS    Time spent on care of this patient: 40 mins   Allie Bossier , MD   [facility] Office  [phone] Pager - [phone]  On-Call/Text Page:      [email or web address]      password [email or web address]  If 7PM-7AM, please contact night-coverage [email or web address] Password [email or web address] 05/24/2014, 8:50 AM   LOS: 3 days

## 2014-05-24 NOTE — Progress Notes (Signed)
Occupational Therapy Evaluation Patient Details Name: Clayton [name] MRN: [id number] DOB: Feb 27, 1926 Today's Date: 05/24/2014    History of Present Illness Pt admitted from [facility] 7/13 with anemia. Pt s/p R IM nail to hip back in June 28, R LE WBAT. Pt legally blind.   Clinical Impression   PTA, pt receiving rehab services at SNF. Pt presents with decreased functional mobility and decreased independence with ADL due to below deficits. Pt will benefit from skilled OT services at SNF to facilitate eventual D/C home with wife. All further OT to be addressed at SNF.    Follow Up Recommendations  SNF;Supervision/Assistance - 24 hour    Equipment Recommendations  Other (comment) (TBD by SNF)    Recommendations for Other Services       Precautions / Restrictions Precautions Precautions: Fall Restrictions Weight Bearing Restrictions: Yes RLE Weight Bearing: Weight bearing as tolerated      Mobility Bed Mobility Overal bed mobility: Needs Assistance Bed Mobility: Supine to Sit Rolling: Min assist            Transfers Overall transfer level: Needs assistance   Transfers: Sit to/from Stand;Stand Pivot Transfers Sit to Stand: Mod assist Stand pivot transfers: Mod assist;From elevated surface            Balance Overall balance assessment: Needs assistance   Sitting balance-Leahy Scale: Fair     Standing balance support: During functional activity Standing balance-Leahy Scale: Poor                              ADL Overall ADL's : Needs assistance/impaired Eating/Feeding: Set up   Grooming: Set up;Supervision/safety   Upper Body Bathing: Minimal assitance;Sitting   Lower Body Bathing: Maximal assistance   Upper Body Dressing : Moderate assistance;Sitting   Lower Body Dressing: Maximal assistance;Sit to/from stand   Toilet Transfer: Stand-pivot;Moderate assistance           Functional mobility during ADLs: Rolling walker;Cueing for safety;Cueing  for sequencing;Moderate assistance (increased assistance to power up to stand) General ADL Comments: Good attempts at ADL     Vision                     Perception     Praxis      Pertinent Vitals/Pain VSS. No c/o pain     Hand Dominance Right   Extremity/Trunk Assessment Upper Extremity Assessment Upper Extremity Assessment: Generalized weakness LUE Deficits / Details: limited L UE function due to broken arm at 12 yrs   Lower Extremity Assessment Lower Extremity Assessment: Defer to PT evaluation   Cervical / Trunk Assessment Cervical / Trunk Assessment: Kyphotic   Communication Communication Communication: HOH   Cognition Arousal/Alertness: Awake/alert Behavior During Therapy: WFL for tasks assessed/performed Overall Cognitive Status:  (wife states closer to baseline)                     General Comments   Good participation.Very supportive wife.    Exercises       Shoulder Instructions      Home Living Family/patient expects to be discharged to:: Skilled nursing facility                                        Prior Functioning/Environment Level of Independence: Needs assistance  Gait / Transfers Assistance Needed: pt working with  PT ADL's / Homemaking Assistance Needed: Wife performs most homemaking tasks.  Pt was able to perform ADLs         OT Diagnosis: Generalized weakness;Blindness and low vision;Acute pain   OT Problem List: Decreased strength;Decreased range of motion;Decreased activity tolerance;Impaired balance (sitting and/or standing);Pain;Impaired vision/perception;Decreased coordination;Impaired UE functional use;Decreased knowledge of use of DME or AE   OT Treatment/Interventions:      OT Goals(Current goals can be found in the care plan section) Acute Rehab OT Goals Patient Stated Goal: to get better and go home OT Goal Formulation: With patient/family Time For Goal Achievement:  (eval only)  OT  Frequency:     Barriers to D/C:            Co-evaluation              End of Session Equipment Utilized During Treatment: Gait belt Nurse Communication: Mobility status  Activity Tolerance: Patient tolerated treatment well Patient left: in chair;with call bell/phone within reach;with family/visitor present   Time: 6213-0865 OT Time Calculation (min): 15 min Charges:  OT General Charges $OT Visit: 1 Procedure OT Evaluation $Initial OT Evaluation Tier I: 1 Procedure OT Treatments $Self Care/Home Management : 8-22 mins G-Codes:    Apryl Brymer,HILLARY 06-04-14, 4:21 PM   [facility], OTR/L  [phone] 06/04/2014

## 2014-05-24 NOTE — Care Management Note (Unsigned)
    Page 1 of 1   05/24/2014     11:49:51 AM CARE MANAGEMENT NOTE 05/24/2014  Patient:  Clayton [name], Clayton [name]   Account Number:  [id number]  Date Initiated:  05/22/2014  Documentation initiated by:  Marvetta Gibbons  Subjective/Objective Assessment:   Pt admitted wtih anemia/elevated INR, hematoma on hip post op     Action/Plan:   PTA pt was at [facility] following hip fx and repair   Anticipated DC Date:     Anticipated DC Plan:  [place] referral  Clinical Social Worker      DC Planning Services  CM consult      Choice offered to / List presented to:             Status of service:  In process, will continue to follow Medicare Important Message given?  YES (If response is "NO", the following Medicare IM given date fields will be blank) Date Medicare IM given:  05/24/2014 Medicare IM given by:  GRAVES-BIGELOW,Deaisha Welborn Date Additional Medicare IM given:   Additional Medicare IM given by:    Discharge Disposition:    Per UR Regulation:  Reviewed for med. necessity/level of care/duration of stay  If discussed at [place] of Stay Meetings, dates discussed:    Comments:  05-24-14 [address], [place] [phone] Per MD notes- S/p vitamin K x2, FFP x1 and 2 units PRBCs - s/p one unit transfused 05-23-14. CM did speak to wife and plan is to return to [facility] once medically stable for d/c. CSW to assist with disposition needs.

## 2014-05-24 NOTE — Clinical Social Work Placement (Signed)
Clinical Social Work Department CLINICAL SOCIAL WORK PLACEMENT NOTE 05/24/2014  Patient:  Clayton [name], Clayton [name]  Account Number:  [id number] Admit date:  05/21/2014  Clinical Social Worker:  Kemper Durie, [place]  Date/time:  05/24/2014 02:50 PM  Clinical Social Work is seeking post-discharge placement for this patient at the following level of care:   SKILLED NURSING   (*CSW will update this form in Epic as items are completed)   05/24/2014  Patient/family provided with [place] Department of Clinical Social Work's list of facilities offering this level of care within the geographic area requested by the patient (or if unable, by the patient's family).  05/24/2014  Patient/family informed of their freedom to choose among providers that offer the needed level of care, that participate in Medicare, Medicaid or managed care program needed by the patient, have an available bed and are willing to accept the patient.  05/24/2014  Patient/family informed of [facility]' ownership interest in [facility], as well as of the fact that they are under no obligation to receive care at this facility.  PASARR submitted to EDS on  PASARR number received on   FL2 transmitted to all facilities in geographic area requested by pt/family on  05/24/2014 FL2 transmitted to all facilities within larger geographic area on   Patient informed that his/her managed care company has contracts with or will negotiate with  certain facilities, including the following:     Patient/family informed of bed offers received:   Patient chooses bed at  Physician recommends and patient chooses bed at    Patient to be transferred to  on   Patient to be transferred to facility by  Patient and family notified of transfer on  Name of family member notified:    The following physician request were entered in Epic:   Additional Comments:    Liz Beach MSW, [place], [place], [phone]

## 2014-05-25 DIAGNOSIS — Z471 Aftercare following joint replacement surgery: Secondary | ICD-10-CM

## 2014-05-25 DIAGNOSIS — I209 Angina pectoris, unspecified: Secondary | ICD-10-CM

## 2014-05-25 DIAGNOSIS — I25810 Atherosclerosis of coronary artery bypass graft(s) without angina pectoris: Secondary | ICD-10-CM

## 2014-05-25 DIAGNOSIS — F0391 Unspecified dementia with behavioral disturbance: Secondary | ICD-10-CM

## 2014-05-25 DIAGNOSIS — F03918 Unspecified dementia, unspecified severity, with other behavioral disturbance: Secondary | ICD-10-CM

## 2014-05-25 DIAGNOSIS — C800 Disseminated malignant neoplasm, unspecified: Secondary | ICD-10-CM

## 2014-05-25 DIAGNOSIS — Z96649 Presence of unspecified artificial hip joint: Secondary | ICD-10-CM

## 2014-05-25 DIAGNOSIS — D689 Coagulation defect, unspecified: Secondary | ICD-10-CM

## 2014-05-25 LAB — PROTIME-INR
INR: 1.53 — ABNORMAL HIGH (ref 0.00–1.49)
PROTHROMBIN TIME: 18.4 s — AB (ref 11.6–15.2)

## 2014-05-25 MED ORDER — TRAMADOL HCL 50 MG PO TABS
50.0000 mg | ORAL_TABLET | Freq: Four times a day (QID) | ORAL | Status: DC
  Administered 2014-05-25: 50 mg via ORAL
  Filled 2014-05-25: qty 1

## 2014-05-25 NOTE — Clinical Social Work Note (Signed)
Per wife family DOES want patient to return to [facility]. Patient CAN be discharged back to facility over weekend if MD determines that patient is medically stable to discharge. Weekend report will be left for weekend CSWs.   Liz Beach MSW, [place], [place], [phone]

## 2014-05-25 NOTE — Progress Notes (Signed)
Pt d/c back to [place] rehab. Pt present and aware.

## 2014-05-25 NOTE — Discharge Summary (Signed)
Physician Discharge Summary  Clayton [name] NVB:[id number] DOB: 1926/03/20 DOA: 05/21/2014  PCP: [name], MD  Admit date: 05/21/2014 Discharge date: 05/25/2014  Time spent: 40 minutes  Recommendations for Outpatient Follow-up:  Acute blood loss anemia  -S/p vitamin K x2, FFP x1 and 3 units pRBCs - goal Hgb should be 8 given hx of CAD/CABG  -Hemoglobin stable today, continue to monitor   Coagulopathy due to coumadin  -Spoke with Dr. [name] (cardiology) and agrees to hold Coumadin for 2 weeks and then F/U w/ his PA in 2 weeks. - Will have Pt obtain CBC and INR prior to visit  - Patient will restart Coumadin on 31 July; restart her previous home dose of 6 mg on M/T/W/F And 7 mg on Th/F  Multifocal R LE hematomas  -Due to coagulopathy in setting of recent ORIF - follow clinically  -Continue to hold all anticoagulants until  31 July  Recent R hip fx s/p ORIF 05/10/2014  As per Ortho   AoVR w/ mechanical valve  -Spoke Dr. [name] (cardiology) and he agrees to above.  -Patient will followup with PA of  Dr. [name] (cardiology) in 2 weeks.    CAD s/p CABG 1999  -Stable negative chest pain/SOB   Metastatic prostate CA  -Patient has appointment at oncology clinic on 7/21 @ 1330 For outpt eval with Dr. Concha Norway (oncology)  Dementia  -At baseline per wife   Discharge Diagnoses:  Active Problems:   Anemia   Discharge Condition: Stable  Diet recommendation: Regular  Filed Weights   05/22/14 0305  Weight: 93 kg (205 lb 0.4 oz)    History of present illness:  80 yo WM PMHx Diastolic CHF, dementia, S/P [employer] mechanical aortic valve replacement in 1999 with concomitant CABG on coumadin, dementia, recent admission for closed fracture of R hip s/p ORIF 05/10/2014, and acute blood loss anemia who was discharged to [facility]. Per report, patient had been becoming progressively weaker and pale appearing, and was found to have a Hgb around 5. At  [facility] his hemoglobin was 5.8, INR 10.4, white blood cell count 11.3, creatinine 1.4. Hemoccult was trace positive. Urinalysis with trace blood. He was given 2 units of blood at [facility]. Was also given one unit of FFP as well as 2 rounds of vitamin K. A CT abd and pelvis/hip @ [facility] noted post operative changes in R hip with hematomas in R gluteal musculature and proximal, medial R thigh - R LE CT noted hematoma in R adductor magnus and hematoma along R greater trochanter extending along post aspect of femoral neck. After discontinuing patient's anti-coagulation patient's hemoglobin stabilized.   Consultants:  Dr. Jean Rosenthal (orthopedic surgery)  Dr. Doren Custard Nasher/Dr. [name] (cardiology)   Procedure/Significant Events:  7/13 CT abd and pelvis/hip @ [facility]; noted post operative changes in R hip with hematomas in R gluteal musculature and proximal, medial R thigh  7/13 right lower extremity CT;  noted hematoma in R adductor magnus and hematoma along R greater trochanter extending along post aspect of femoral neck.    Culture  NA     Antibiotics:  NA    Discharge Exam: Filed Vitals:   05/25/14 0700 05/25/14 0722 05/25/14 1105 05/25/14 1445  BP:  158/61 139/69   Pulse:  72 97   Temp: 99.2 F (37.3 C)  97.4 F (36.3 C) 97.9 F (36.6 C)  TempSrc: Oral  Oral Oral  Resp:  17 25   Height:  Weight:      SpO2:  95% 96%     General: A./O. x4, NAD, No acute respiratory distress  Lungs: Clear to auscultation bilaterally without wheezes or crackles  Cardiovascular: Regular rate and rhythm without murmur gallop or rub normal S1 and S2  Abdomen: Nontender, nondistended, soft, bowel sounds positive, no rebound, no ascites, no appreciable mass  Extremities: No significant cyanosis, clubbing, or edema bilateral lower extremities   Discharge Instructions     Medication List    ASK your doctor about these medications       aspirin EC 81  MG tablet  Take 81 mg by mouth daily.     cyanocobalamin 1000 MCG/ML injection  Commonly known as:  (VITAMIN B-12)  Inject 1,000 mcg into the muscle See admin instructions. 1000 mcg IM daily for 5 days (received 7/7-7/11), then weekly for 3 weeks (first dose due 7/18), then monthly     docusate sodium 100 MG capsule  Commonly known as:  COLACE  Take 100 mg by mouth 2 (two) times daily.     donepezil 5 MG tablet  Commonly known as:  ARICEPT  Take 5 mg by mouth at bedtime.     dorzolamide-timolol 22.3-6.8 MG/ML ophthalmic solution  Commonly known as:  COSOPT  Place 1 drop into both eyes 2 (two) times daily.     HYDROcodone-acetaminophen 5-325 MG per tablet  Commonly known as:  NORCO/VICODIN  Take 1-2 tablets by mouth every 4 (four) hours as needed (1 tablet - mild pain; 2 tablets mod-severe pain).     latanoprost 0.005 % ophthalmic solution  Commonly known as:  XALATAN  Place 1 drop into both eyes at bedtime.     lisinopril 10 MG tablet  Commonly known as:  PRINIVIL,ZESTRIL  Take 10 mg by mouth daily.     meclizine 25 MG tablet  Commonly known as:  ANTIVERT  Take 25 mg by mouth daily as needed for dizziness or nausea.     metoprolol 100 MG tablet  Commonly known as:  LOPRESSOR  Take 100 mg by mouth daily.     metoprolol 50 MG tablet  Commonly known as:  LOPRESSOR  Take 50 mg by mouth at bedtime.     multivitamin-lutein Caps capsule  Take 1 capsule by mouth 2 (two) times daily.     omeprazole 20 MG capsule  Commonly known as:  PRILOSEC  Take 20 mg by mouth daily.     polyethylene glycol packet  Commonly known as:  MIRALAX / GLYCOLAX  Take 17 g by mouth daily as needed for mild constipation.     tamsulosin 0.4 MG Caps capsule  Commonly known as:  FLOMAX  Take 0.4 mg by mouth at bedtime.     warfarin 5 MG tablet  Commonly known as:  COUMADIN  Take 5 mg by mouth See admin instructions. Takes along with 2m tablets to equal 655mon Mon, Tue, Wed, Fri, Sat and 39m61mon Sun, Thur     warfarin 1 MG tablet  Commonly known as:  COUMADIN  Take 1-2 mg by mouth See admin instructions. Takes 1mg20mlong with the 5mg 339mlet) to equal 6mg o69mon, Tue, Wed, Fri, Sat and takes 2mg (a239mg with 5mg tab41m) to equal 39mg on S61mand Thur       Allergies  Allergen Reactions  . Tetanus Toxoids Hives      The results of significant diagnostics from this hospitalization (including imaging, microbiology, ancillary and laboratory) are listed  below for reference.    Significant Diagnostic Studies: Dg Chest 1 View  05/06/2014   CLINICAL DATA:  Fall, right hip pain  EXAM: CHEST - 1 VIEW  COMPARISON:  10/17/2013  FINDINGS: Cardiomediastinal silhouette is stable. Status post CABG. No acute infiltrate or pleural effusion. No pulmonary edema.  IMPRESSION: No active disease.  Status post CABG.   Electronically Signed   By: Lahoma Crocker M.D.   On: 05/06/2014 13:35   Dg Pelvis 1-2 Views  05/06/2014   CLINICAL DATA:  Fall  EXAM: PELVIS - 1-2 VIEW  COMPARISON:  None.  FINDINGS: There is a displaced fracture of the lesser trochanter. There is also lucency in the intertrochanteric region medially. There is slight varus deformity of the proximal femur. Impaction intertrochanteric fracture is not excluded. Osteopenia.  IMPRESSION: Avulsed fracture of the lesser trochanter.  Possible intertrochanteric femur fracture.  CT is recommended.   Electronically Signed   By: Maryclare Marek M.D.   On: 05/06/2014 13:37   Dg Femur Right  05/11/2014   CLINICAL DATA:  Right hip ORIF  EXAM: DG C-ARM 61-120 MIN; RIGHT FEMUR - 2 VIEW  : COMPARISON:  MRI of the right hip from 2 days prior  FINDINGS: Status post intra medullary nail fixation of the femur with dynamic hip screw. No evidence of periprosthetic fracture or other complication.  IMPRESSION: Right femur internal fixation.   Electronically Signed   By: Jorje Guild M.D.   On: 05/11/2014 04:04   Dg Femur Right  05/06/2014   CLINICAL DATA:  Fall  EXAM:  RIGHT FEMUR - 2 VIEW  COMPARISON:  None.  FINDINGS: The lesser trochanter is avulsed and displaced superiorly. There are overlapping structures at the intertrochanteric region. There is also slight varus deformity of the proximal femur. Underlying intertrochanteric fracture is not excluded.  IMPRESSION: Avulsed fracture of the right lesser trochanter. There may be an intertrochanteric fracture. Attention on pelvic radiograph is recommended.   Electronically Signed   By: Maryclare Hasty M.D.   On: 05/06/2014 13:35   Ct Head Wo Contrast  05/22/2014   CLINICAL DATA:  Confusion  EXAM: CT HEAD WITHOUT CONTRAST  TECHNIQUE: Contiguous axial images were obtained from the base of the skull through the vertex without intravenous contrast.  COMPARISON:  Prior CT from 05/06/2014  FINDINGS: Atrophy with chronic microvascular ischemic changes are stable from prior.  There is no acute intracranial hemorrhage or infarct. No mass lesion or midline shift. Gray-white matter differentiation is well maintained. Ventricles are normal in size without evidence of hydrocephalus. CSF containing spaces are within normal limits. No extra-axial fluid collection.  The calvarium is intact.  Orbital soft tissues are within normal limits.  The paranasal sinuses and mastoid air cells are well pneumatized and free of fluid.  Scalp soft tissues are unremarkable.  IMPRESSION: 1. No acute intracranial process. 2. Stable atrophy with moderate chronic microvascular ischemic disease.   Electronically Signed   By: Jeannine Boga M.D.   On: 05/22/2014 03:31   Ct Head Wo Contrast  05/06/2014   CLINICAL DATA:  Fall  EXAM: CT HEAD WITHOUT CONTRAST  TECHNIQUE: Contiguous axial images were obtained from the base of the skull through the vertex without intravenous contrast.  COMPARISON:  None.  FINDINGS: Global atrophy. Chronic ischemic changes in the periventricular white matter. Cranium is intact. Mastoid air cells clear. Visualized paranasal sinuses are  clear.  IMPRESSION: No acute intracranial pathology.   Electronically Signed   By: Duffy Rhody.D.  On: 05/06/2014 13:04   Nm Bone Scan Whole Body  05/07/2014   CLINICAL DATA:  Abnormal CT scan. Question metastatic disease. Pathologic fracture.  EXAM: NUCLEAR MEDICINE WHOLE BODY BONE SCAN  TECHNIQUE: Whole body anterior and posterior images were obtained approximately 3 hours after intravenous injection of radiopharmaceutical.  RADIOPHARMACEUTICALS:  Twenty-five mCi Technetium-99 MDP  COMPARISON:  Bone survey 05/06/2014. CT of the right hip 05/06/2014. Thoracic and lumbar spine MRI 10/18/2013. Focal uptake within the scapula on the right is concerning for metastatic disease. This could be within posterior ribs. Minimal uptake is noted within the left lesser trochanter, potentially degenerative. Uptake within the medial aspect of the right knee is degenerative.  FINDINGS: Focal uptake is present along the lesser trochanter fracture and along the anterior inferior iliac spine. The former corresponds with fracture. The ladder with some degenerative change there may be a sclerotic lesion at the anterior inferior iliac spine as well. Focal uptake is present on the right at T10. There was no abnormality on the previous MRI.  IMPRESSION: 1. Uptake at the site of the fracture. 2. Uptake within the anterior inferior iliac spine on the right is concerning for metastatic disease. This corresponds with a focal sclerotic lesion. 3. The uptake within the right scapula may represent metastatic disease. 4. Focal uptake at T10 on the right was not present on the prior MRI. This may represent new disease.   Electronically Signed   By: Lawrence Santiago M.D.   On: 05/07/2014 16:52   Ct Hip Right Wo Contrast  05/06/2014   CLINICAL DATA:  RIGHT hip pain. Fracture identified on prior radiographs.  EXAM: CT OF THE RIGHT HIP WITHOUT CONTRAST  TECHNIQUE: Multidetector CT imaging was performed according to the standard protocol.  Multiplanar CT image reconstructions were also generated.  COMPARISON:  Radiographs 05/06/2014. CT 05/01/2013 at [facility].  FINDINGS: There is an avulsion of the RIGHT lesser trochanter with anterior displacement of 4.3 cm. Proximal retraction is mild, measuring about 2 cm. On the coronal images, there is sclerosis surrounding the lesser trochanter, suggesting an underlying lesion, likely metastatic disease. No intertrochanteric extension of fracture is identified. Moderate RIGHT hip osteoarthritis is present with subchondral cyst in the femoral head and acetabular roof. Fatty atrophy of the gluteus minimus muscle is present. Small nonspecific sclerotic lesion present in the RIGHT iliac bone. Ossification of the L5-S1 disc with ankylosis. Benign bone island in the RIGHT iliac crest. Aortoiliac atherosclerosis.  IMPRESSION: Displaced avulsion fracture of the RIGHT lesser trochanter. Crescentic sclerosis around the location of the avulsed fragment suggests an underlying osseous lesion. This crescentic sclerosis is new compared to the prior CT and is compatible with an underlying osseous metastatic lesion. Followup whole-body bone scan recommended to assess for other foci of metastatic disease. In this older man, PSA should also be obtained.   Electronically Signed   By: Dereck Ligas M.D.   On: 05/06/2014 15:39   Mr Hip Right Wo Contrast  05/08/2014   CLINICAL DATA:  Status post fall.  Right hip pain.  EXAM: MRI OF THE RIGHT HIP WITHOUT CONTRAST  TECHNIQUE: Multiplanar, multisequence MR imaging was performed. No intravenous contrast was administered.  COMPARISON:  None.  FINDINGS: HIP JOINTS  Both femoral heads appear normal without evidence of acute fracture, dislocation or avascular necrosis. There is no significant hip joint effusion. There is no gross labral or paralabral abnormality.  BONY PELVIS  There is an avulsion fracture of the right lesser trochanter superiorly displaced. There is  adjacent  soft tissue edema. There is a 2.3 x 5.3 cm osseous lesion within the proximal right femur at site of the lesser trochanteric avulsion.  There is a 2 cm superior right acetabular T1 hypointense lesion within adjacent 14 mm lesion. There is a 11.4 cm left intertrochanteric T1 hypo intense lesion. There is a small hypo intense lesion in the left posterior acetabulum.  MUSCLES AND TENDONS  Severe edema in the right iliopsoas muscle and tendon with proximal retraction consistent with lesser trochanteric avulsion fracture. Severe soft tissue edema in the extensor and adductor compartment. The gluteus and hamstring tendons appear normal. The piriformis muscles appear symmetric.  INTERNAL PELVIC FINDINGS  The visualized internal pelvic contents are unremarkable.  IMPRESSION: 1. Right lesser trochanteric avulsion fracture with an underlying osseous lesion in the right proximal femur concerning for malignancy. 2. Multiple hypointense lesions involving the right acetabulum, left acetabulum, right proximal femur and left proximal femur most concerning for metastatic disease.   Electronically Signed   By: Kathreen Devoid   On: 05/08/2014 09:05   Dg Chest Portable 1 View  05/22/2014   CLINICAL DATA:  Congestion.  EXAM: PORTABLE CHEST - 1 VIEW  COMPARISON:  May 21, 2014.  FINDINGS: Stable cardiomediastinal silhouette. Status post coronary artery bypass graft. No pneumothorax or pleural effusion is noted. Mild central pulmonary vascular congestion is noted. Narrowing of right subacromial space is noted concerning for rotator cuff injury.  IMPRESSION: Mild central pulmonary vascular congestion is noted. No other acute abnormality seen.   Electronically Signed   By: Sabino Dick M.D.   On: 05/22/2014 16:42   Dg Bone Survey Met  05/07/2014   CLINICAL DATA:  Metastatic bone survey.  EXAM: METASTATIC BONE SURVEY  COMPARISON:  CT right hip 05/06/2014  FINDINGS: No lytic or sclerotic bone lesions are identified in the axial or  appendicular skeleton.  There is an avulsion fracture again demonstrated at the lesser trochanter region of the right hip.  There is degenerative joint disease.  IMPRESSION: Negative metastatic bone survey for lytic or sclerotic metastatic disease.  Avulsion fracture at the lesser trochanter of the right hip.  Degenerative joint disease.   Electronically Signed   By: Kalman Jewels M.D.   On: 05/07/2014 07:33   Dg C-arm 1-60 Min  05/11/2014   CLINICAL DATA:  Right hip ORIF  EXAM: DG C-ARM 61-120 MIN; RIGHT FEMUR - 2 VIEW  : COMPARISON:  MRI of the right hip from 2 days prior  FINDINGS: Status post intra medullary nail fixation of the femur with dynamic hip screw. No evidence of periprosthetic fracture or other complication.  IMPRESSION: Right femur internal fixation.   Electronically Signed   By: Jorje Guild M.D.   On: 05/11/2014 04:04    Microbiology: Recent Results (from the past 240 hour(s))  MRSA PCR SCREENING     Status: None   Collection Time    05/21/14 10:12 PM      Result Value Ref Range Status   MRSA by PCR NEGATIVE  NEGATIVE Final   Comment:            The GeneXpert MRSA Assay (FDA     approved for NASAL specimens     only), is one component of a     comprehensive MRSA colonization     surveillance program. It is not     intended to diagnose MRSA     infection nor to guide or     monitor treatment for  MRSA infections.  URINE CULTURE     Status: None   Collection Time    05/22/14  2:05 AM      Result Value Ref Range Status   Specimen Description URINE, CLEAN CATCH   Final   Special Requests NONE   Final   Culture  Setup Time     Final   Value: 05/22/2014 02:26     Performed at [facility] Count     Final   Value: NO GROWTH     Performed at [facility]   Culture     Final   Value: NO GROWTH     Performed at [facility]   Report Status 05/23/2014 FINAL   Final     Labs: Basic Metabolic Panel:  Recent Labs Lab  05/22/14 0437 05/23/14 0615 05/24/14 0357  NA 139 140 140  K 5.0 4.5 4.7  CL 99 101 101  CO2 _0 GLUCOSE 92 100* 108*  BUN 32* 24* 19  CREATININE 1.24 1.15 1.08  CALCIUM 8.3* 8.1* 8.1*   Liver Function Tests:  Recent Labs Lab 05/24/14 0357  AST 87*  ALT 62*  ALKPHOS 176*  BILITOT 1.8*  PROT 6.0  ALBUMIN 2.2*   No results found for this basename: LIPASE, AMYLASE,  in the last 168 hours No results found for this basename: AMMONIA,  in the last 168 hours CBC:  Recent Labs Lab 05/22/14 0437 05/22/14 1514 05/22/14 2220 05/23/14 0615 05/23/14 1435 05/24/14 0357  WBC 12.0* 10.0 10.3 10.8* 10.8* 11.3*  NEUTROABS 8.4* 7.2 7.9* 8.2* 8.3*  --   HGB 8.0* 7.4* 7.3* 7.5* 7.8* 8.5*  HCT 25.4* 23.1* 23.0* 23.8* 24.5* 27.0*  MCV 92.7 90.9 92.0 94.1 94.2 93.1  PLT 404* 387 354 373 381 358   Cardiac Enzymes:  Recent Labs Lab 05/22/14 0015 05/22/14 0437 05/22/14 0937  TROPONINI <0.30 <0.30 <0.30   BNP: BNP (last 3 results)  Recent Labs  05/12/14 1817 05/14/14 0434 05/22/14 0015  PROBNP 2081.0* 1564.0* 3017.0*   CBG: No results found for this basename: GLUCAP,  in the last 168 hours     Signed:  Dia Crawford, MD [facility] [phone] pager

## 2014-05-25 NOTE — Progress Notes (Signed)
Occupational Therapy Note  Pt was seen twice by OT for OT evaluation in error.  This therapist worked with pt from [phone].   See OT evaluation for details of his performance as this is consistent with this performance with this OT.  Plan as per OT evaluation.   No charge for visit.   Lucille Passy, OTR/L [phone]

## 2014-05-25 NOTE — Clinical Social Work Placement (Signed)
Clinical Social Work Department CLINICAL SOCIAL WORK PLACEMENT NOTE 05/25/2014  Patient:  Clayton [name], Clayton [name]  Account Number:  [id number] Admit date:  05/21/2014  Clinical Social Worker:  Kemper Durie, [place]  Date/time:  05/24/2014 02:50 PM  Clinical Social Work is seeking post-discharge placement for this patient at the following level of care:   SKILLED NURSING   (*CSW will update this form in Epic as items are completed)   05/24/2014  Patient/family provided with [facility] Department of Clinical Social Work's list of facilities offering this level of care within the geographic area requested by the patient (or if unable, by the patient's family).  05/24/2014  Patient/family informed of their freedom to choose among providers that offer the needed level of care, that participate in Medicare, Medicaid or managed care program needed by the patient, have an available bed and are willing to accept the patient.  05/24/2014  Patient/family informed of [facility]' ownership interest in [facility], as well as of the fact that they are under no obligation to receive care at this facility.  PASARR submitted to EDS on  PASARR number received on   FL2 transmitted to all facilities in geographic area requested by pt/family on  05/24/2014 FL2 transmitted to all facilities within larger geographic area on   Patient informed that his/her managed care company has contracts with or will negotiate with  certain facilities, including the following:     Patient/family informed of bed offers received:  05/25/2014 Patient chooses bed at [place] Physician recommends and patient chooses bed at    Patient to be transferred to [facility] on  05/25/2014 Patient to be transferred to facility by Ambulance Patient and family notified of transfer on 05/25/2014 Name of family member notified:  Adonis Huguenin (at bedside)  The following physician request were entered in  Epic:   Additional Comments: Per MD patient ready to return to [facility] and [place], patient, patient's wife and facility aware of DC. RN given number for report. DC packet on chart. Ambulance transport requests. Initially family wanted patient to go to [facility] but changed their mind on Friday afternoon and wanted patient to return to [place]. CSW signing off at this time.   Liz Beach MSW, [place], [place], [phone]

## 2014-05-25 NOTE — Progress Notes (Signed)
Physical Therapy Treatment Patient Details Name: Clayton [name] MRN: [id number] DOB: [birth year] Today's Date: June 15, 2014    History of Present Illness Pt admitted from [facility] 7/13 with anemia. Pt s/p R IM nail to hip back in June 28, R LE WBAT. Pt legally blind.    PT Comments    Patient making gains with ambulation today.  Follow Up Recommendations  SNF     Equipment Recommendations  None recommended by PT    Recommendations for Other Services       Precautions / Restrictions Precautions Precautions: Fall Precaution Comments: Patient legally blind Restrictions Weight Bearing Restrictions: Yes RLE Weight Bearing: Weight bearing as tolerated    Mobility  Bed Mobility                  Transfers Overall transfer level: Needs assistance Equipment used: Rolling walker (2 wheeled) Transfers: Sit to/from Stand Sit to Stand: Min assist;+2 physical assistance         General transfer comment: verbal cues for hand placement.  Assist to rise to standing and for balance.    Ambulation/Gait Ambulation/Gait assistance: Min assist;+2 physical assistance Ambulation Distance (Feet): 10 Feet ( 5' x2) Assistive device: Rolling walker (2 wheeled) Gait Pattern/deviations: Step-through pattern;Decreased step length - right;Decreased step length - left;Decreased stance time - right;Decreased weight shift to right;Shuffle;Trunk flexed Gait velocity: Decreased Gait velocity interpretation: Below normal speed for age/gender General Gait Details: Verbal cues for gait sequencing.  Able to ambulate 5' forward and backward to chair x2 with sitting rest break.   Stairs            Wheelchair Mobility    Modified Rankin (Stroke Patients Only)       Balance           Standing balance support: Bilateral upper extremity supported;During functional activity Standing balance-Leahy Scale: Poor                      Cognition Arousal/Alertness: Awake/alert Behavior  During Therapy: WFL for tasks assessed/performed Overall Cognitive Status: History of cognitive impairments - at baseline (per wife)                      Exercises General Exercises - Lower Extremity Ankle Circles/Pumps: AROM;Both;10 reps;Seated Long Arc Quad: AROM;Both;10 reps;Seated Hip Flexion/Marching: AROM;Both;10 reps;Seated    General Comments        Pertinent Vitals/Pain     Home Living                      Prior Function            PT Goals (current goals can now be found in the care plan section) Progress towards PT goals: Progressing toward goals    Frequency  Min 3X/week    PT Plan Current plan remains appropriate    Co-evaluation             End of Session Equipment Utilized During Treatment: Gait belt Activity Tolerance: Patient limited by fatigue Patient left: in chair;with call bell/phone within reach;with family/visitor present     Time: 1410-1425 PT Time Calculation (min): 15 min  Charges:  $Therapeutic Activity: 8-22 mins                    G Codes:      Despina Pole 06-15-2014, 7:17 PM Carita Pian. Sanjuana Kava, [place] Pager [phone]

## 2014-05-26 LAB — TYPE AND SCREEN
ABO/RH(D): A POS
ANTIBODY SCREEN: NEGATIVE
UNIT DIVISION: 0
Unit division: 0

## 2014-05-29 ENCOUNTER — Ambulatory Visit (HOSPITAL_BASED_OUTPATIENT_CLINIC_OR_DEPARTMENT_OTHER): Payer: Medicare HMO | Admitting: Internal Medicine

## 2014-05-29 ENCOUNTER — Other Ambulatory Visit: Payer: Commercial Managed Care - HMO

## 2014-05-29 ENCOUNTER — Ambulatory Visit: Payer: Commercial Managed Care - HMO

## 2014-05-29 ENCOUNTER — Telehealth: Payer: Self-pay

## 2014-05-29 ENCOUNTER — Encounter: Payer: Self-pay | Admitting: Internal Medicine

## 2014-05-29 ENCOUNTER — Telehealth: Payer: Self-pay | Admitting: Internal Medicine

## 2014-05-29 VITALS — BP 151/56 | HR 62 | Temp 98.2°F | Resp 19 | Ht 68.0 in | Wt 196.0 lb

## 2014-05-29 DIAGNOSIS — IMO0002 Reserved for concepts with insufficient information to code with codable children: Secondary | ICD-10-CM | POA: Diagnosis not present

## 2014-05-29 DIAGNOSIS — M84453A Pathological fracture, unspecified femur, initial encounter for fracture: Secondary | ICD-10-CM

## 2014-05-29 DIAGNOSIS — C7951 Secondary malignant neoplasm of bone: Secondary | ICD-10-CM | POA: Diagnosis not present

## 2014-05-29 DIAGNOSIS — R740 Nonspecific elevation of levels of transaminase and lactic acid dehydrogenase [LDH]: Secondary | ICD-10-CM

## 2014-05-29 DIAGNOSIS — I5030 Unspecified diastolic (congestive) heart failure: Secondary | ICD-10-CM

## 2014-05-29 DIAGNOSIS — C801 Malignant (primary) neoplasm, unspecified: Secondary | ICD-10-CM

## 2014-05-29 DIAGNOSIS — Z7901 Long term (current) use of anticoagulants: Secondary | ICD-10-CM

## 2014-05-29 DIAGNOSIS — D649 Anemia, unspecified: Secondary | ICD-10-CM

## 2014-05-29 DIAGNOSIS — C61 Malignant neoplasm of prostate: Secondary | ICD-10-CM | POA: Insufficient documentation

## 2014-05-29 DIAGNOSIS — E785 Hyperlipidemia, unspecified: Secondary | ICD-10-CM

## 2014-05-29 DIAGNOSIS — E538 Deficiency of other specified B group vitamins: Secondary | ICD-10-CM

## 2014-05-29 DIAGNOSIS — F0390 Unspecified dementia without behavioral disturbance: Secondary | ICD-10-CM

## 2014-05-29 DIAGNOSIS — N189 Chronic kidney disease, unspecified: Secondary | ICD-10-CM

## 2014-05-29 DIAGNOSIS — N179 Acute kidney failure, unspecified: Secondary | ICD-10-CM

## 2014-05-29 DIAGNOSIS — R7402 Elevation of levels of lactic acid dehydrogenase (LDH): Secondary | ICD-10-CM

## 2014-05-29 DIAGNOSIS — C7952 Secondary malignant neoplasm of bone marrow: Secondary | ICD-10-CM | POA: Diagnosis not present

## 2014-05-29 DIAGNOSIS — I509 Heart failure, unspecified: Secondary | ICD-10-CM

## 2014-05-29 DIAGNOSIS — I10 Essential (primary) hypertension: Secondary | ICD-10-CM

## 2014-05-29 MED ORDER — BICALUTAMIDE 50 MG PO TABS: 50.0000 mg | ORAL_TABLET | Freq: Every day | ORAL | Status: DC

## 2014-05-29 NOTE — Progress Notes (Signed)
Checked in new patient with no financial issues prior to seeing the dr. He has appt card and [place] bee [place] of [place].

## 2014-05-29 NOTE — Progress Notes (Signed)
[facility] [phone]   [phone]  NEW PATIENT EVALUATION   Name: Clayton [name] Date: 05/29/2014 MRN: [id number] DOB: [birth year]  PCP: [name], MD   REFERRING PHYSICIAN: Raina Mina., MD  REASON FOR REFERRAL: Suspected metastatic prostate cancer    HISTORY OF PRESENT ILLNESS:Clayton [name] is a 80 y.o. male who is being referred for further evaluation of his suspected metastatic prostate cancer.   Today, he is accompanied by his wife Adonis Huguenin and his son Darryl.  They report that Mr. Grieser had a fall on the 27th of June.  He reports getting his feet tangled up in bathroom and had mechanical fall.  Shortly following his fall, he reported right hip pain prompting a follow up visit to ER on th 28th.   He had CXR, right femur x-Jerid and x-rays of his pelvis.  The CXR demonstrated no acute disease.  Right femur x-Demontrez revealed an avulsed fracture of the right lesser trochanter which may be an intertrochanteric fracture. Attention on pelvic radiograph was recommended. He had a subsequent Ct of of his right hip confirming a displaced avulsion fracture of the RIGHT lesser trochanter. There was a crescentic sclerosis around the location of the avulsed fragment suggested an underlying osseous lesion. This crescentic sclerosis was new compared to the prior CT and was compatible with an underlying osseous metastatic lesion. Followup whole-body bone scan was recommended to assess for other foci of metastatic disease.. Bone survey was negative  for lytic or sclerotic metastatic disease.  He also had nuclear medicine bone scan revealing the following: 1. Uptake at the site of the fracture. 2. Uptake within the anterior inferior iliac spine on the right is concerning for metastatic disease.  3. The uptake within the right scapula may represent metastatic disease.  4. Focal uptake at T10 on the right was not present on the prior MRI. This may represent new disease.  His PSA was  collected on 05/06/2014 was elevated to 70.23.   He had surgery with open reduction internal fixation right hip  due to impending pathologic fracture of the right hip on 05/10/2014.  This was done by Dr. Jean Rosenthal. He was discharged to subacute rehab at [facility] on 05/14/2014. He was readmitted on 05/21/2014 to the hospitalist service (Dr. Dia Crawford) with a right adductor magnus hematoma and low hemoglobin (around 5.8) due to supratherapeutic INR of 10.4.  CT of abdomen and pelvis/hip at [facility] was negative for intraabdominal metastatic disease.  He was maintained on coumadin due to an artificial heart valve.  He was given vitamin K x 2, FFP x 1 and 3 units of packed RBCs.  He was discharged back to the [facility] rehab on 05/25/2014.  Son reports that a couple years ago, he had hematuria.  He was told that he had elevated PSA in the past but they cannot recollect how high.   He has never had a colonoscopy. He is now completely blind due to advanced macular degeneration.     PAST MEDICAL HISTORY:  has a past medical history of CAD (coronary artery disease); HLD (hyperlipidemia); HTN (hypertension); GERD (gastroesophageal reflux disease); H/O hiatal hernia; Arthritis; Peripheral vascular disease; and Aortic aneurysm.     PAST SURGICAL HISTORY: Past Surgical History  Procedure Laterality Date  . Aortic valve replacement  1998  . Coronary artery bypass graft  1998  . Cholecystectomy    . Tonsillectomy    . US extremity*l* Left  arm  . Inguinal hernia repair  09/25/2013    with mesh  . Inguinal hernia repair Right 09/25/2013    Procedure: HERNIA REPAIR INGUINAL ADULT;  Surgeon: Imogene Burn. Georgette Dover, MD;  Location: [facility];  Service: General;  Laterality: Right;  . Insertion of mesh Right 09/25/2013    Procedure: INSERTION OF MESH;  Surgeon: Imogene Burn. Georgette Dover, MD;  Location: [facility];  Service: General;  Laterality: Right;  . Hand surgery Left     MULTIPLE   . Intramedullary (im)  nail intertrochanteric Right 05/10/2014    Procedure: Open Reduction Internal Fixation Right Hip;  Surgeon: Mcarthur Rossetti, MD;  Location: [facility];  Service: Orthopedics;  Laterality: Right;   CURRENT MEDICATIONS: has a current medication list which includes the following prescription(s): aspirin ec, cyanocobalamin, docusate sodium, donepezil, dorzolamide-timolol, hydrocodone-acetaminophen, latanoprost, lisinopril, meclizine, metoprolol, metoprolol, multivitamin-lutein, polyethylene glycol, and tamsulosin.  ALLERGIES: Tetanus toxoids  SOCIAL HISTORY:  reports that he has quit smoking. He has never used smokeless tobacco. He reports that he does not drink alcohol or use illicit drugs.  FAMILY HISTORY: family history includes Coronary artery disease in an other family member.   LABORATORY DATA:  CBC    Component Value Date/Time   WBC 11.3* 05/24/2014 0357   RBC 2.90* 05/24/2014 0357   HGB 8.5* 05/24/2014 0357   HCT 27.0* 05/24/2014 0357   PLT 358 05/24/2014 0357   MCV 93.1 05/24/2014 0357   MCH 29.3 05/24/2014 0357   MCHC 31.5 05/24/2014 0357   RDW 16.1* 05/24/2014 0357   LYMPHSABS 1.1 05/23/2014 1435   MONOABS 1.1* 05/23/2014 1435   EOSABS 0.2 05/23/2014 1435   BASOSABS 0.0 05/23/2014 1435   CMP     Component Value Date/Time   NA 140 05/24/2014 0357   K 4.7 05/24/2014 0357   CL 101 05/24/2014 0357   CO2 28 05/24/2014 0357   GLUCOSE 108* 05/24/2014 0357   BUN 19 05/24/2014 0357   CREATININE 1.08 05/24/2014 0357   CALCIUM 8.1* 05/24/2014 0357   PROT 6.0 05/24/2014 0357   ALBUMIN 2.2* 05/24/2014 0357   AST 87* 05/24/2014 0357   ALT 62* 05/24/2014 0357   ALKPHOS 176* 05/24/2014 0357   BILITOT 1.8* 05/24/2014 0357   GFRNONAA 61* 05/24/2014 0357   GFRAA 70* 05/24/2014 0357    Current Outpatient Prescriptions  Medication Sig Dispense Refill  . aspirin EC 81 MG tablet Take 81 mg by mouth daily.      . cyanocobalamin (,VITAMIN B-12,) 1000 MCG/ML injection Inject 1,000 mcg into the muscle See admin  instructions. 1000 mcg IM daily for 5 days (received 7/7-7/11), then weekly for 3 weeks (first dose due 7/18), then monthly      . docusate sodium (COLACE) 100 MG capsule Take 100 mg by mouth 2 (two) times daily.      Marland Kitchen donepezil (ARICEPT) 5 MG tablet Take 5 mg by mouth at bedtime.       . dorzolamide-timolol (COSOPT) 22.3-6.8 MG/ML ophthalmic solution Place 1 drop into both eyes 2 (two) times daily.       Marland Kitchen HYDROcodone-acetaminophen (NORCO/VICODIN) 5-325 MG per tablet Take 1-2 tablets by mouth every 4 (four) hours as needed (1 tablet - mild pain; 2 tablets mod-severe pain).      Marland Kitchen latanoprost (XALATAN) 0.005 % ophthalmic solution Place 1 drop into both eyes at bedtime.       Marland Kitchen lisinopril (PRINIVIL,ZESTRIL) 10 MG tablet Take 1 tablet by mouth daily.      . meclizine (  ANTIVERT) 25 MG tablet Take 25 mg by mouth daily as needed for dizziness or nausea.       . metoprolol (LOPRESSOR) 100 MG tablet Take 100 mg by mouth daily.       . metoprolol (LOPRESSOR) 50 MG tablet Take 50 mg by mouth at bedtime.      . multivitamin-lutein (OCUVITE-LUTEIN) CAPS Take 1 capsule by mouth 2 (two) times daily.        . polyethylene glycol (MIRALAX / GLYCOLAX) packet Take 17 g by mouth daily as needed for mild constipation.      . Tamsulosin HCl (FLOMAX) 0.4 MG CAPS Take 0.4 mg by mouth at bedtime.        No current facility-administered medications for this visit.      RADIOGRAPHY: Dg Chest 1 View  05/06/2014   CLINICAL DATA:  Fall, right hip pain  EXAM: CHEST - 1 VIEW  COMPARISON:  10/17/2013  FINDINGS: Cardiomediastinal silhouette is stable. Status post CABG. No acute infiltrate or pleural effusion. No pulmonary edema.  IMPRESSION: No active disease.  Status post CABG.   Electronically Signed   By: Lahoma Crocker M.D.   On: 05/06/2014 13:35   Dg Pelvis 1-2 Views  05/06/2014   CLINICAL DATA:  Fall  EXAM: PELVIS - 1-2 VIEW  COMPARISON:  None.  FINDINGS: There is a displaced fracture of the lesser trochanter. There is  also lucency in the intertrochanteric region medially. There is slight varus deformity of the proximal femur. Impaction intertrochanteric fracture is not excluded. Osteopenia.  IMPRESSION: Avulsed fracture of the lesser trochanter.  Possible intertrochanteric femur fracture.  CT is recommended.   Electronically Signed   By: Maryclare Hunzeker M.D.   On: 05/06/2014 13:37   Dg Femur Right  05/11/2014   CLINICAL DATA:  Right hip ORIF  EXAM: DG C-ARM 61-120 MIN; RIGHT FEMUR - 2 VIEW  : COMPARISON:  MRI of the right hip from 2 days prior  FINDINGS: Status post intra medullary nail fixation of the femur with dynamic hip screw. No evidence of periprosthetic fracture or other complication.  IMPRESSION: Right femur internal fixation.   Electronically Signed   By: Jorje Guild M.D.   On: 05/11/2014 04:04   Dg Femur Right  05/06/2014   CLINICAL DATA:  Fall  EXAM: RIGHT FEMUR - 2 VIEW  COMPARISON:  None.  FINDINGS: The lesser trochanter is avulsed and displaced superiorly. There are overlapping structures at the intertrochanteric region. There is also slight varus deformity of the proximal femur. Underlying intertrochanteric fracture is not excluded.  IMPRESSION: Avulsed fracture of the right lesser trochanter. There may be an intertrochanteric fracture. Attention on pelvic radiograph is recommended.   Electronically Signed   By: Maryclare Peaster M.D.   On: 05/06/2014 13:35   Ct Head Wo Contrast  05/22/2014   CLINICAL DATA:  Confusion  EXAM: CT HEAD WITHOUT CONTRAST  TECHNIQUE: Contiguous axial images were obtained from the base of the skull through the vertex without intravenous contrast.  COMPARISON:  Prior CT from 05/06/2014  FINDINGS: Atrophy with chronic microvascular ischemic changes are stable from prior.  There is no acute intracranial hemorrhage or infarct. No mass lesion or midline shift. Gray-white matter differentiation is well maintained. Ventricles are normal in size without evidence of hydrocephalus. CSF containing  spaces are within normal limits. No extra-axial fluid collection.  The calvarium is intact.  Orbital soft tissues are within normal limits.  The paranasal sinuses and mastoid air cells are well pneumatized  and free of fluid.  Scalp soft tissues are unremarkable.  IMPRESSION: 1. No acute intracranial process. 2. Stable atrophy with moderate chronic microvascular ischemic disease.   Electronically Signed   By: Jeannine Boga M.D.   On: 05/22/2014 03:31   Ct Head Wo Contrast  05/06/2014   CLINICAL DATA:  Fall  EXAM: CT HEAD WITHOUT CONTRAST  TECHNIQUE: Contiguous axial images were obtained from the base of the skull through the vertex without intravenous contrast.  COMPARISON:  None.  FINDINGS: Global atrophy. Chronic ischemic changes in the periventricular white matter. Cranium is intact. Mastoid air cells clear. Visualized paranasal sinuses are clear.  IMPRESSION: No acute intracranial pathology.   Electronically Signed   By: Maryclare Bannister M.D.   On: 05/06/2014 13:04   Nm Bone Scan Whole Body  05/07/2014   CLINICAL DATA:  Abnormal CT scan. Question metastatic disease. Pathologic fracture.  EXAM: NUCLEAR MEDICINE WHOLE BODY BONE SCAN  TECHNIQUE: Whole body anterior and posterior images were obtained approximately 3 hours after intravenous injection of radiopharmaceutical.  RADIOPHARMACEUTICALS:  Twenty-five mCi Technetium-99 MDP  COMPARISON:  Bone survey 05/06/2014. CT of the right hip 05/06/2014. Thoracic and lumbar spine MRI 10/18/2013. Focal uptake within the scapula on the right is concerning for metastatic disease. This could be within posterior ribs. Minimal uptake is noted within the left lesser trochanter, potentially degenerative. Uptake within the medial aspect of the right knee is degenerative.  FINDINGS: Focal uptake is present along the lesser trochanter fracture and along the anterior inferior iliac spine. The former corresponds with fracture. The ladder with some degenerative change there may be  a sclerotic lesion at the anterior inferior iliac spine as well. Focal uptake is present on the right at T10. There was no abnormality on the previous MRI.  IMPRESSION: 1. Uptake at the site of the fracture. 2. Uptake within the anterior inferior iliac spine on the right is concerning for metastatic disease. This corresponds with a focal sclerotic lesion. 3. The uptake within the right scapula may represent metastatic disease. 4. Focal uptake at T10 on the right was not present on the prior MRI. This may represent new disease.   Electronically Signed   By: Lawrence Santiago M.D.   On: 05/07/2014 16:52   Ct Hip Right Wo Contrast  05/06/2014   CLINICAL DATA:  RIGHT hip pain. Fracture identified on prior radiographs.  EXAM: CT OF THE RIGHT HIP WITHOUT CONTRAST  TECHNIQUE: Multidetector CT imaging was performed according to the standard protocol. Multiplanar CT image reconstructions were also generated.  COMPARISON:  Radiographs 05/06/2014. CT 05/01/2013 at [facility].  FINDINGS: There is an avulsion of the RIGHT lesser trochanter with anterior displacement of 4.3 cm. Proximal retraction is mild, measuring about 2 cm. On the coronal images, there is sclerosis surrounding the lesser trochanter, suggesting an underlying lesion, likely metastatic disease. No intertrochanteric extension of fracture is identified. Moderate RIGHT hip osteoarthritis is present with subchondral cyst in the femoral head and acetabular roof. Fatty atrophy of the gluteus minimus muscle is present. Small nonspecific sclerotic lesion present in the RIGHT iliac bone. Ossification of the L5-S1 disc with ankylosis. Benign bone island in the RIGHT iliac crest. Aortoiliac atherosclerosis.  IMPRESSION: Displaced avulsion fracture of the RIGHT lesser trochanter. Crescentic sclerosis around the location of the avulsed fragment suggests an underlying osseous lesion. This crescentic sclerosis is new compared to the prior CT and is compatible with an  underlying osseous metastatic lesion. Followup whole-body bone scan recommended to assess for  other foci of metastatic disease. In this older man, PSA should also be obtained.   Electronically Signed   By: Dereck Ligas M.D.   On: 05/06/2014 15:39   Mr Hip Right Wo Contrast  05/08/2014   CLINICAL DATA:  Status post fall.  Right hip pain.  EXAM: MRI OF THE RIGHT HIP WITHOUT CONTRAST  TECHNIQUE: Multiplanar, multisequence MR imaging was performed. No intravenous contrast was administered.  COMPARISON:  None.  FINDINGS: HIP JOINTS  Both femoral heads appear normal without evidence of acute fracture, dislocation or avascular necrosis. There is no significant hip joint effusion. There is no gross labral or paralabral abnormality.  BONY PELVIS  There is an avulsion fracture of the right lesser trochanter superiorly displaced. There is adjacent soft tissue edema. There is a 2.3 x 5.3 cm osseous lesion within the proximal right femur at site of the lesser trochanteric avulsion.  There is a 2 cm superior right acetabular T1 hypointense lesion within adjacent 14 mm lesion. There is a 11.4 cm left intertrochanteric T1 hypo intense lesion. There is a small hypo intense lesion in the left posterior acetabulum.  MUSCLES AND TENDONS  Severe edema in the right iliopsoas muscle and tendon with proximal retraction consistent with lesser trochanteric avulsion fracture. Severe soft tissue edema in the extensor and adductor compartment. The gluteus and hamstring tendons appear normal. The piriformis muscles appear symmetric.  INTERNAL PELVIC FINDINGS  The visualized internal pelvic contents are unremarkable.  IMPRESSION: 1. Right lesser trochanteric avulsion fracture with an underlying osseous lesion in the right proximal femur concerning for malignancy. 2. Multiple hypointense lesions involving the right acetabulum, left acetabulum, right proximal femur and left proximal femur most concerning for metastatic disease.    Electronically Signed   By: Kathreen Devoid   On: 05/08/2014 09:05   Dg Chest Portable 1 View  05/22/2014   CLINICAL DATA:  Congestion.  EXAM: PORTABLE CHEST - 1 VIEW  COMPARISON:  May 21, 2014.  FINDINGS: Stable cardiomediastinal silhouette. Status post coronary artery bypass graft. No pneumothorax or pleural effusion is noted. Mild central pulmonary vascular congestion is noted. Narrowing of right subacromial space is noted concerning for rotator cuff injury.  IMPRESSION: Mild central pulmonary vascular congestion is noted. No other acute abnormality seen.   Electronically Signed   By: Sabino Dick M.D.   On: 05/22/2014 16:42   Dg Bone Survey Met  05/07/2014   CLINICAL DATA:  Metastatic bone survey.  EXAM: METASTATIC BONE SURVEY  COMPARISON:  CT right hip 05/06/2014  FINDINGS: No lytic or sclerotic bone lesions are identified in the axial or appendicular skeleton.  There is an avulsion fracture again demonstrated at the lesser trochanter region of the right hip.  There is degenerative joint disease.  IMPRESSION: Negative metastatic bone survey for lytic or sclerotic metastatic disease.  Avulsion fracture at the lesser trochanter of the right hip.  Degenerative joint disease.   Electronically Signed   By: Kalman Jewels M.D.   On: 05/07/2014 07:33   Dg C-arm 1-60 Min  05/11/2014   CLINICAL DATA:  Right hip ORIF  EXAM: DG C-ARM 61-120 MIN; RIGHT FEMUR - 2 VIEW  : COMPARISON:  MRI of the right hip from 2 days prior  FINDINGS: Status post intra medullary nail fixation of the femur with dynamic hip screw. No evidence of periprosthetic fracture or other complication.  IMPRESSION: Right femur internal fixation.   Electronically Signed   By: Jorje Guild M.D.   On: 05/11/2014 04:04  PATHOLOGY: Diagnosis Bone, biopsy, proximal right femur - OSTEOPENIC LAMELLAR BONE. - THERE IS NO EVIDENCE OF MALIGNANCY. Enid Cutter MD Pathologist, Electronic Signature   REVIEW OF SYSTEMS:  Constitutional: Denies  fevers, chills or abnormal weight loss Eyes: Denies blurriness of vision Ears, nose, mouth, throat, and face: Denies mucositis or sore throat Respiratory: Denies cough, dyspnea or wheezes Cardiovascular: Denies palpitation, chest discomfort or lower extremity swelling Gastrointestinal:  Denies nausea, heartburn or change in bowel habits Skin: Denies abnormal skin rashes Lymphatics: Denies new lymphadenopathy or easy bruising Neurological:Denies numbness, tingling or new weaknesses Behavioral/Psych: Mood is stable, no new changes  All other systems were reviewed with the patient and are negative.  PHYSICAL EXAM:  height is 5' 8"  (1.727 m) and weight is 196 lb (88.905 kg). His oral temperature is 98.2 F (36.8 C). His blood pressure is 151/56 and his pulse is 62. His respiration is 19 and oxygen saturation is 98%.    GENERAL:alert, no distress and comfortable; chronically ill appearing elderly male sitting in his wheelchair. Blind bilaterally and HOH.  SKIN: skin color, texture, turgor are normal, no rashes or significant lesions EYES: normal, Conjunctiva are pink and non-injected, sclera clear OROPHARYNX:no exudate, no erythema and lips, buccal mucosa, and tongue normal  NECK: supple, thyroid normal size, non-tender, without nodularity LYMPH:  no palpable lymphadenopathy in the cervical, axillary or inguinal LUNGS: clear to auscultation and percussion with normal breathing effort HEART: regular rate & rhythm and no murmurs and no lower extremity edema ABDOMEN:abdomen soft, non-tender and normal bowel sounds Musculoskeletal:no cyanosis of digits and no clubbing  NEURO: alert & oriented x 3 with fluent speech, no focal motor/sensory deficits   IMPRESSION: Clayton [name] is a 80 y.o. male with a history of   PLAN:  1.  Probable Metastatic Prostate cancer. --We reviewed his images in detail with findings consistent with Stage IV disease as evidence by multiple bone metastases.  Pathology was  negative for malignancy.   In addition, he had an elevated PSA of 70.  Given these findings, we explained that he likely has metastatic prostate cancer.  Given his advanced age and poor functional status along with multiple co-morbidities, family declined referral to urology for consideration of tissue biopsy.  --We reviewed the CT of abdomen and pelvis report obtained from [facility] during his recent admission.  It was negative for visceral disease.  We discussed treatment options for stage IV prostate cancer includes ADT with Lupron plus casodex versus chemotherapy.  He is not a chemotherapy candidate due to his functional status and co-morbidities.  We will obtain a baseline PSA and testosterone and start on ADT in one week or 06/05/2014. .  We discussed the indications would be palliative therapy, the benefits include improved quality of life and decreased pain, and risks include but are not limited to hot flashes, weight changes, decreased libido, testicular atrophy, tumor flare (an initial rise in serum testosterone concentrations may cause  including bone pain, neuropathy, hematuria, or ureteral or bladder outlet obstruction during the first 2 weeks). We will start with casodex 50 mg daily to minimize this risk. He consented and agreed to proceed with ADT understanding the indications, benefits and risks.   2. Pathologic R. Hip fracture(avulsion, less trochanteric) s/p R ORIF (05/10/2014) --Continuing physical therapy at [facility].   3. S/p complications with Hematoma, supratherapautic INR. --He was instructed to hold a/c due to risk of bleeding.    4. AVR s/p mechanical valve replacement.  --Holding anticoagulation due  to #2, #3  5. CAD/ CABG --He is on metoprolol 25 mg bid and aspirin.  6. Blindness secondary to Macular Degeneration.   7. ECOG 2/3  8. Dementia -- On Aricept.   9. Diastolic CHF/HTN/HLD  10. Elevated transaminases NOS --Unclear etiology.  Possible hepatic  congestion.  We will continue to trend.  11. Normocytic anemia --Likely anemia of chronic disease with concomitant anemia of blood loss secondary to #3.  We will continue to trend.  Less likely related to his mild chronic kidney disease, CrCl is greater than 54.2.   12. Follow up.  --The patient should obtain a baseline PSA, testosterone prior to receipt of his Lupron 22.5 mg next week.  He was provided a prescription for casodex 50 mg daily for 28 days.   All questions were answered. The patient knows to call the clinic with any problems, questions or concerns. We can certainly see the patient much sooner if necessary.  I spent 40 minutes counseling the patient face to face. The total time spent in the appointment was 60 minutes.    Trinnity Breunig, MD 05/29/2014 2:59 PM

## 2014-05-29 NOTE — Telephone Encounter (Signed)
gv adn printed appt sched and avs for pt for July....gv pt barium °

## 2014-05-29 NOTE — Patient Instructions (Signed)
Bicalutamide tablets What is this medicine? BICALUTAMIDE (bye ka LOO ta mide) blocks the effect of the male hormone testosterone on the prostate. This medicine is used to treat advanced prostate cancer in men. It is given with other treatments. This medicine may be used for other purposes; ask your health care provider or pharmacist if you have questions. COMMON BRAND NAME(S): Casodex What should I tell my health care provider before I take this medicine? They need to know if you have any of these conditions: -if you are male (this medicine is not for use in women) -liver disease -an unusual or allergic reaction to bicalutamide, other medicines, foods, dyes, or preservatives How should I use this medicine? Take this medicine by mouth with a glass of water. You may take it with or without food. Follow the directions on the prescription label. Take your medicine at regular intervals. Do not take your medicine more often than directed. Do not stop taking except on your doctor's advice. Talk to your pediatrician regarding the use of this medicine in children. Special care may be needed. Overdosage: If you think you have taken too much of this medicine contact a poison control center or emergency room at once. NOTE: This medicine is only for you. Do not share this medicine with others. What if I miss a dose? If you miss a dose, take it as soon as you can. If it is almost time for your next dose, take only that dose. Do not take double or extra doses. What may interact with this medicine? -warfarin This list may not describe all possible interactions. Give your health care provider a list of all the medicines, herbs, non-prescription drugs, or dietary supplements you use. Also tell them if you smoke, drink alcohol, or use illegal drugs. Some items may interact with your medicine. What should I watch for while using this medicine? Visit your doctor or health care professional for regular checks on  your progress. You may need regular tests to make sure your liver is working properly. This medicine should not be used in women. Serious side effects to an unborn child are possible. Talk to your doctor or pharmacist for more information. What side effects may I notice from receiving this medicine? Side effects that you should report to your doctor or health care professional as soon as possible: -allergic reactions like skin rash, itching or hives, swelling of the face, lips, or tongue -blood in the urine -breathing problems -chest pain -dark urine -severe nausea and vomiting -yellowing of the eyes or skin Side effects that usually do not require medical attention (report to your doctor or health care professional if they continue or are bothersome): -diarrhea -hot flashes -loss of appetite -nausea -weak or tired This list may not describe all possible side effects. Call your doctor for medical advice about side effects. You may report side effects to FDA at 1-800-FDA-1088. Where should I keep my medicine? Keep out of the reach of children. Store between 20 and 25 degrees C (68 and 77 degrees F). Throw away any unused medicine after the expiration date. NOTE: This sheet is a summary. It may not cover all possible information. If you have questions about this medicine, talk to your doctor, pharmacist, or health care provider.  2015, Elsevier/Gold Standard. (2008-01-09 14:49:46) Leuprolide injection What is this medicine? LEUPROLIDE (loo PROE lide) is a man-made hormone. It is used to treat the symptoms of prostate cancer. This medicine may also be used to treat children with  early onset of puberty. It may be used for other hormonal conditions. This medicine may be used for other purposes; ask your health care provider or pharmacist if you have questions. COMMON BRAND NAME(S): Lupron What should I tell my health care provider before I take this medicine? They need to know if you have  any of these conditions: -diabetes -heart disease or previous heart attack -high blood pressure -high cholesterol -pain or difficulty passing urine -spinal cord metastasis -stroke -tobacco smoker -an unusual or allergic reaction to leuprolide, benzyl alcohol, other medicines, foods, dyes, or preservatives -pregnant or trying to get pregnant -breast-feeding How should I use this medicine? This medicine is for injection under the skin or into a muscle. You will be taught how to prepare and give this medicine. Use exactly as directed. Take your medicine at regular intervals. Do not take your medicine more often than directed. It is important that you put your used needles and syringes in a special sharps container. Do not put them in a trash can. If you do not have a sharps container, call your pharmacist or healthcare provider to get one. Talk to your pediatrician regarding the use of this medicine in children. While this medicine may be prescribed for children as young as 8 years for selected conditions, precautions do apply. Overdosage: If you think you have taken too much of this medicine contact a poison control center or emergency room at once. NOTE: This medicine is only for you. Do not share this medicine with others. What if I miss a dose? If you miss a dose, take it as soon as you can. If it is almost time for your next dose, take only that dose. Do not take double or extra doses. What may interact with this medicine? Do not take this medicine with any of the following medications: -chasteberry This medicine may also interact with the following medications: -herbal or dietary supplements, like black cohosh or DHEA -male hormones, like estrogens or progestins and birth control pills, patches, rings, or injections -male hormones, like testosterone This list may not describe all possible interactions. Give your health care provider a list of all the medicines, herbs, non-prescription  drugs, or dietary supplements you use. Also tell them if you smoke, drink alcohol, or use illegal drugs. Some items may interact with your medicine. What should I watch for while using this medicine? Visit your doctor or health care professional for regular checks on your progress. During the first week, your symptoms may get worse, but then will improve as you continue your treatment. You may get hot flashes, increased bone pain, increased difficulty passing urine, or an aggravation of nerve symptoms. Discuss these effects with your doctor or health care professional, some of them may improve with continued use of this medicine. Male patients may experience a menstrual cycle or spotting during the first 2 months of therapy with this medicine. If this continues, contact your doctor or health care professional. What side effects may I notice from receiving this medicine? Side effects that you should report to your doctor or health care professional as soon as possible: -allergic reactions like skin rash, itching or hives, swelling of the face, lips, or tongue -breathing problems -chest pain -depression or memory disorders -pain in your legs or groin -pain at site where injected -severe headache -swelling of the feet and legs -visual changes -vomiting Side effects that usually do not require medical attention (report to your doctor or health care professional if they continue  or are bothersome): -breast swelling or tenderness -decrease in sex drive or performance -diarrhea -hot flashes -loss of appetite -muscle, joint, or bone pains -nausea -redness or irritation at site where injected -skin problems or acne This list may not describe all possible side effects. Call your doctor for medical advice about side effects. You may report side effects to FDA at 1-800-FDA-1088. Where should I keep my medicine? Keep out of the reach of children. Store below 25 degrees C (77 degrees F). Do not  freeze. Protect from light. Do not use if it is not clear or if there are particles present. Throw away any unused medicine after the expiration date. NOTE: This sheet is a summary. It may not cover all possible information. If you have questions about this medicine, talk to your doctor, pharmacist, or health care provider.  2015, Elsevier/Gold Standard. (2009-03-12 13:26:20) Prostate Cancer  Prostate cancer is the abnormal growth of cells in your prostate gland. Your prostate gland is involved in the production of semen. It is located below your bladder and in front of your rectum. A normal prostate gland is the size of a walnut and surrounds the tube that carries urine from the bladder (urethra).  RISK FACTORS  Age older than 35 years.  African-American race.  Obesity.  Family history of prostate cancer.  Family history of breast cancer. SIGNS AND SYMPTOMS   Frequent urination.  Weak or interrupted flow of urine.  Difficulty starting or stopping urination.  Inability to urinate.  Painful or burning urination.  Painful ejaculation.  Blood in urine or semen.  Persistent pain or discomfort in the lower back, lower abdomen, hips, or upper thighs.  Difficulty getting an erection.  Difficulty emptying your bladder completely. DIAGNOSIS  Prostate cancer can be diagnosed by a digital rectal exam, prostate-specific antigen (PSA) blood test, transrectal ultrasonography, and then a biopsy to test a tissue sample. Usually 8-12 samples are taken. The tissue is sent to a specialist who looks at tissues and cells (pathologist). If cancer is diagnosed, the next step is to stage the cancer. This means it is put in a category based on how far the cancer has spread. This is important for helping your health care providers plan appropriate treatment. The following are the different stages of prostate cancer:  Stage I--Cancer is found in the prostate only. It cannot be felt during a digital  rectal exam and is not visible by imaging. It is usually found accidentally, such as during surgery for other prostate problems.  Stage II--Cancer is more advanced than in stage I but has not spread outside the prostate.  Stage III--Cancer has spread beyond the outer layer of the prostate to nearby tissues. Cancer may be found in the seminal vesicles.  Stage IV--Cancer has spread to lymph nodes or to other parts of the body. The cancer may have spread to the bladder, rectum, bones, liver, or lungs. Prostate cancer often spreads to the bones. Imaging scans, such as a bone scan, CT, PET, or MRI, are done to help in the staging process. TREATMENT  Treatments such as surgery, medicines, and radiation may be recommended based on the stage of the cancer and other factors. Once cancer of the prostate has been diagnosed, your health care provider will discuss your treatment with you. Your health care provider will help you decide on the best course of treatment. Treatment often depends on your age, health, and other risk factors. The more common methods of treatment are:  Observation for  early stage prostate cancer.  Open surgery--This involves a surgery to remove the prostate.  Laparoscopic prostatectomy to remove the prostate and lymph nodes.  Robotic prostatectomy to remove the prostate and lymph nodes.  External beam radiation, which aims beams of radiation from outside the body at the prostate.  Internal radiation (brachytherapy), which uses radioactive needles, 40-100 pellets (seeds), wires, or catheters implanted directly into the prostate gland.  High-intensity, focused ultrasonography to destroy cancer cells.  Cryosurgery to freeze and destroy prostate cancer cells.  Chemotherapy medicines to stop the growth of cancer cells either by killing them or by stopping them from multiplying.  Hormone treatment--Medicines that stop your body from producing testosterone, or medicines that block  testosterone from reaching cancer cells.  Orchiectomy--This is surgery to remove your testicles. HOME CARE INSTRUCTIONS  Only take over-the-counter or prescription medicines for pain, discomfort, or fever as directed by your health care provider.  Maintain a healthy diet.  Get plenty of sleep.  Consider joining a support group. This may help you learn to cope with the stress of having prostate cancer.  Seek advice to help you manage treatment side effects.  Keep all follow-up appointments as directed by your health care provider.  Inform your cancer specialist if you are admitted to the hospital.  Continue sexual expression--If you experience erectile dysfunction, your natural reaction will be to pull away and avoid all sexual contact. Consider touching, holding, hugging, and caressing as ways to continue sharing sexuality with your partner. SEEK MEDICAL CARE IF:  You have trouble urinating.  You have blood in your urine.  You have trouble getting an erection.  You have pain in your hips, back, or chest. SEEK IMMEDIATE MEDICAL CARE IF:  You have weakness or numbness in your legs.  You have involuntary loss of urine or stool (incontinence). Document Released: 10/26/2005 Document Revised: 08/16/2013 Document Reviewed: 04/14/2013 [facility] Patient Information 2015 [place], [place]. This information is not intended to replace advice given to you by your health care provider. Make sure you discuss any questions you have with your health care provider.

## 2014-05-29 NOTE — Telephone Encounter (Signed)
Sent request for CT abd report to HIM at [facility].

## 2014-05-29 NOTE — Telephone Encounter (Signed)
Faxed report of consultation sheet to [facility] re[facility] where pt is staying. The family left this behind.

## 2014-05-29 NOTE — Telephone Encounter (Signed)
Received CBC reprot of 7/21 from [facility]. Forwarded to Dr Juliann Mule.

## 2014-05-31 ENCOUNTER — Telehealth: Payer: Self-pay | Admitting: Cardiovascular Disease

## 2014-05-31 NOTE — Telephone Encounter (Signed)
New message     Need to clarify when pt is to restart coumadin and is patient still due for labs.  Please call

## 2014-05-31 NOTE — Telephone Encounter (Signed)
Deb from [facility] called to clarify date when Dr. Burt Knack wanted patient to be restarted on Coumadin. In Discharge Summary from 05/25/14, Dr. Burt Knack had noted that patient was to restart Coumadin (as previously ordered) on July 31st. Deb clarified date and dose/schedule. Deb also stated they would be getting the CBC and INR on July 30th and will call results in to Dr. Burt Knack. Appointment to follow up with Dr. Burt Knack will be on August 10th. Originally requested to be on July 31st but transportation unavailable so rescheduled for August 10th.

## 2014-06-05 ENCOUNTER — Ambulatory Visit (HOSPITAL_BASED_OUTPATIENT_CLINIC_OR_DEPARTMENT_OTHER): Payer: Commercial Managed Care - HMO | Admitting: Internal Medicine

## 2014-06-05 ENCOUNTER — Telehealth: Payer: Self-pay | Admitting: Internal Medicine

## 2014-06-05 ENCOUNTER — Telehealth: Payer: Self-pay

## 2014-06-05 ENCOUNTER — Other Ambulatory Visit: Payer: Self-pay | Admitting: Internal Medicine

## 2014-06-05 ENCOUNTER — Ambulatory Visit (HOSPITAL_BASED_OUTPATIENT_CLINIC_OR_DEPARTMENT_OTHER): Payer: Commercial Managed Care - HMO

## 2014-06-05 ENCOUNTER — Other Ambulatory Visit (HOSPITAL_BASED_OUTPATIENT_CLINIC_OR_DEPARTMENT_OTHER): Payer: Commercial Managed Care - HMO

## 2014-06-05 VITALS — BP 127/53 | HR 59 | Temp 98.2°F | Resp 18 | Ht 68.0 in | Wt 186.0 lb

## 2014-06-05 DIAGNOSIS — C7952 Secondary malignant neoplasm of bone marrow: Secondary | ICD-10-CM

## 2014-06-05 DIAGNOSIS — C61 Malignant neoplasm of prostate: Secondary | ICD-10-CM

## 2014-06-05 DIAGNOSIS — C7951 Secondary malignant neoplasm of bone: Secondary | ICD-10-CM

## 2014-06-05 DIAGNOSIS — D649 Anemia, unspecified: Secondary | ICD-10-CM

## 2014-06-05 MED ORDER — LEUPROLIDE ACETATE (3 MONTH) 22.5 MG IM KIT
22.5000 mg | PACK | Freq: Once | INTRAMUSCULAR | Status: AC
  Administered 2014-06-05: 22.5 mg via INTRAMUSCULAR
  Filled 2014-06-05: qty 22.5

## 2014-06-05 NOTE — Telephone Encounter (Signed)
Pharmacy verified that patient picked up the prescription of casodex 50 mg daily at 9:28 am this morning.

## 2014-06-05 NOTE — Telephone Encounter (Signed)
S/w deb at [facility] and [facility]. Pt is to start casodex. Family has the pills. Orders faxed to [facility]. She is expecting them.

## 2014-06-05 NOTE — Patient Instructions (Signed)
Bone Metastases Cancerous growths can begin in any part of the body. The original site of cancer is called the primary tumor or primary cancer (for example, breast cancer). After cancer has developed in one area of the body, cancerous cells from that area can break away and travel through the body's bloodstream. If these cancerous cells begin growing in another place in the body, they are called metastases. Bone metastases are cancer cells that have spread to the bone (which is different from a cancer that starts in the bone). These secondary growths are like the original tumor. For example, if a prostate cancer spreads to bone it is called metastatic prostate cancer, or prostate cancer metastatic to bone, but not bone cancer. Cancers can spread to almost any bone; the spine and pelvis are often involved.  Any type of cancer can spread to the bone, but the most common are breast, lung, kidney, thyroid and prostate cancers. Sometimes the primary tumor is not discovered until there are bone problems. If the primary cancer location cannot be discovered, the cancer is called cancer of unknown primary location. SYMPTOMS  Pain in the bones is the main symptom of bone metastases. Some other problems may occur first including:  Decreased appetite.  Nausea.  Muscle weakness.  Confusion.  Unusual sleep patterns due to discomfort.  Overly tired (fatigue).  Restlessness. Frail or brittle bones may lead to broken bones (fractures) that lead to learning what is wrong (diagnosis). A tumor often weakens the bones.  DIAGNOSIS  Metastatic cancers may be found months or years after or at the same time as the primary tumor. When a second tumor is found in a patient who has been treated for cancer, it is more often a metastasis than another primary tumor.  The patient's symptoms, physical examination, X-rays and blood tests may suggest a bone metastases. In addition, an examination of tissue or a cell sample  (biopsy) is usually done to find the cancer. This sample is removed with a needle. This tissue sample must be looked at under a microscope to confirm a diagnosis. TREATMENT  Options generally include treatments that give relief from symptoms (palliative) or curative. Those with advanced, metastasized cancer may receive treatment focused on pain relief and prolonging life. These treatments depend on the type of cancer and its location.  Treatment for cancer depends on its type and location. Some of these treatments are:  Surgery to remove the original tumor and/or to remove parts of the body that produce hormones and other chemicals that make cancer worse.  Treatment with drugs (chemotherapy).  Bone marrow transplantations on rare occasions.  Radiation therapy (radiotherapy).  Hormonal therapy.  Pain relieving medications. Your caregiver will help you understand the likelihood that any particular treatment will be helpful for you. While some treatments aim to cure or control the cancer, others give relief from symptoms only. If you have bone metastases, radiation therapy may be recommended to treat pain (if it is in one main location). Pain medications are available. These include strong medicines like morphine. You may be instructed to take a long-acting pain medication (to control most of your pain) and a short-acting medication to control occasional flares of pain. Pain medication is sometimes also given continuously through a pump. HOME CARE INSTRUCTIONS   Take medications exactly as prescribed.  Keep any follow-up appointments.  Pain medications can make you sleepy or confused. Do not drive, climb ladders, or do other dangerous activities while on pain medication.  Pain medications often  cause constipation. Ask your caregiver for information on stool softeners.  Do not share your pain medication with others. SEEK MEDICAL CARE IF:   Your bone pain is not controlled.  You are having  problems or side effects from your medication.  You have excessive sleepiness or confusion. SEEK IMMEDIATE MEDICAL CARE IF:   You fall and have any injury or pain from the fall.  You have trouble walking.  You have numbness or tingling in your legs.  You develop a sudden significant worsening of your pain. Document Released: 10/16/2002 Document Revised: 01/18/2012 Document Reviewed: 06/08/2008 [facility] Patient Information 2015 [place], [place]. This information is not intended to replace advice given to you by your health care provider. Make sure you discuss any questions you have with your health care provider. Prostate Cancer  Prostate cancer is the abnormal growth of cells in your prostate gland. Your prostate gland is involved in the production of semen. It is located below your bladder and in front of your rectum. A normal prostate gland is the size of a walnut and surrounds the tube that carries urine from the bladder (urethra).  RISK FACTORS  Age older than 17 years.  African American race.  Obesity.  Family history of prostate cancer.  Family history of breast cancer. SIGNS AND SYMPTOMS   Frequent urination.  Weak or interrupted flow of urine.  Difficulty starting or stopping urination.  Inability to urinate.  Painful or burning urination.  Painful ejaculation.  Blood in urine or semen.  Persistent pain or discomfort in the lower back, lower abdomen, hips, or upper thighs.  Difficulty getting an erection.  Difficulty emptying your bladder completely. DIAGNOSIS  Prostate cancer can be diagnosed by a digital rectal exam, prostate-specific antigen (PSA) blood test, transrectal ultrasonography, and then a biopsy to test a tissue sample. Usually 8-12 samples are taken. The tissue is sent to a specialist who looks at tissues and cells (pathologist). If cancer is diagnosed, the next step is to stage the cancer. This means it is put in a category based on how far the  cancer has spread. This is important for helping your health care providers plan appropriate treatment. The different stages of prostate cancer are as follows:  Stage I. Cancer is found in the prostate only. It cannot be felt during a digital rectal exam and is not visible by imaging. It is usually found accidentally, such as during surgery for other prostate problems.  Stage II. Cancer is more advanced than in stage I but has not spread outside the prostate.  Stage III. Cancer has spread beyond the outer layer of the prostate to nearby tissues. Cancer may be found in the seminal vesicles.  Stage IV. Cancer has spread to lymph nodes or to other parts of the body. The cancer may have spread to the bladder, rectum, bones, liver, or lungs. Prostate cancer often spreads to the bones. Imaging scans, such as a bone scan, CT, PET, or MRI, are done to help in the staging process. TREATMENT  Treatments such as surgery, medicines, and radiation may be recommended based on the stage of the cancer and other factors. Once cancer of the prostate has been diagnosed, your health care provider will discuss your treatment with you. Your health care provider will help you decide on the best course of treatment. Treatment often depends on your age, health, and other risk factors. The more common methods of treatment are:  Observation for early stage prostate cancer.  Open surgery. This  involves a surgery to remove the prostate.  Laparoscopic prostatectomy to remove the prostate and lymph nodes.  Robotic prostatectomy to remove the prostate and lymph nodes.  External beam radiation, which aims beams of radiation from outside the body at the prostate.  Internal radiation (brachytherapy), which uses radioactive needles, 40-100 pellets (seeds), wires, or catheters implanted directly into the prostate gland.  High-intensity, focused ultrasonography to destroy cancer cells.  Cryosurgery to freeze and destroy  prostate cancer cells.  Chemotherapy medicines to stop the growth of cancer cells either by killing them or by stopping them from multiplying.  Hormone treatment. Medicines that stop your body from producing testosterone, or medicines that block testosterone from reaching cancer cells.  Orchiectomy. This is surgery to remove your testicles. HOME CARE INSTRUCTIONS  Take medicines only as directed by your health care provider.  Maintain a healthy diet.  Get plenty of sleep.  Consider joining a support group. This may help you learn to cope with the stress of having prostate cancer.  Seek advice to help you manage treatment side effects.  Keep all follow-up visits as directed by your health care provider.  Inform your cancer specialist if you are admitted to the hospital.  Continue sexual expression. If you experience erectile dysfunction, your natural reaction will be to pull away and avoid all sexual contact. Consider touching, holding, hugging, and caressing as ways to continue sharing sexuality with your partner. SEEK MEDICAL CARE IF:  You have trouble urinating.  You have blood in your urine.  You have trouble getting an erection.  You have pain in your hips, back, or chest. SEEK IMMEDIATE MEDICAL CARE IF:  You have weakness or numbness in your legs.  You have involuntary loss of urine or stool (incontinence). Document Released: 10/26/2005 Document Revised: 03/12/2014 Document Reviewed: 04/14/2013 [facility] Patient Information 2015 [place], [place]. This information is not intended to replace advice given to you by your health care provider. Make sure you discuss any questions you have with your health care provider. Bicalutamide tablets What is this medicine? BICALUTAMIDE (bye ka LOO ta mide) blocks the effect of the male hormone testosterone on the prostate. This medicine is used to treat advanced prostate cancer in men. It is given with other treatments. This medicine  may be used for other purposes; ask your health care provider or pharmacist if you have questions. COMMON BRAND NAME(S): Casodex What should I tell my health care provider before I take this medicine? They need to know if you have any of these conditions: -if you are male (this medicine is not for use in women) -liver disease -an unusual or allergic reaction to bicalutamide, other medicines, foods, dyes, or preservatives How should I use this medicine? Take this medicine by mouth with a glass of water. You may take it with or without food. Follow the directions on the prescription label. Take your medicine at regular intervals. Do not take your medicine more often than directed. Do not stop taking except on your doctor's advice. Talk to your pediatrician regarding the use of this medicine in children. Special care may be needed. Overdosage: If you think you have taken too much of this medicine contact a poison control center or emergency room at once. NOTE: This medicine is only for you. Do not share this medicine with others. What if I miss a dose? If you miss a dose, take it as soon as you can. If it is almost time for your next dose, take only that dose.  Do not take double or extra doses. What may interact with this medicine? -warfarin This list may not describe all possible interactions. Give your health care provider a list of all the medicines, herbs, non-prescription drugs, or dietary supplements you use. Also tell them if you smoke, drink alcohol, or use illegal drugs. Some items may interact with your medicine. What should I watch for while using this medicine? Visit your doctor or health care professional for regular checks on your progress. You may need regular tests to make sure your liver is working properly. This medicine should not be used in women. Serious side effects to an unborn child are possible. Talk to your doctor or pharmacist for more information. What side effects may  I notice from receiving this medicine? Side effects that you should report to your doctor or health care professional as soon as possible: - allergic reactions like skin rash, itching or hives, swelling of the face, lips, or tongue - blood in the urine - breathing problems - chest pain - dark urine - severe nausea and vomiting - yellowing of the eyes or skin Side effects that usually do not require medical attention (report to your doctor or health care professional if they continue or are bothersome): - diarrhea - hot flashes - loss of appetite - nausea -sensitivity to light - weak or tired This list may not describe all possible side effects. Call your doctor for medical advice about side effects. You may report side effects to FDA at 1-800-FDA-1088. Where should I keep my medicine? Keep out of the reach of children. Store between 20 and 25 degrees C (68 and 77 degrees F). Throw away any unused medicine after the expiration date. NOTE: This sheet is a summary. It may not cover all possible information. If you have questions about this medicine, talk to your doctor, pharmacist, or health care provider.  2015, [employer]. (2014-01-18 16:44:05) Leuprolide injection What is this medicine? LEUPROLIDE (loo PROE lide) is a man-made hormone. It is used to treat the symptoms of prostate cancer. This medicine may also be used to treat children with early onset of puberty. It may be used for other hormonal conditions. This medicine may be used for other purposes; ask your health care provider or pharmacist if you have questions. COMMON BRAND NAME(S): Lupron What should I tell my health care provider before I take this medicine? They need to know if you have any of these conditions: -diabetes -heart disease or previous heart attack -high blood pressure -high cholesterol -pain or difficulty passing urine -spinal cord metastasis -stroke -tobacco smoker -an unusual or  allergic reaction to leuprolide, benzyl alcohol, other medicines, foods, dyes, or preservatives -pregnant or trying to get pregnant -breast-feeding How should I use this medicine? This medicine is for injection under the skin or into a muscle. You will be taught how to prepare and give this medicine. Use exactly as directed. Take your medicine at regular intervals. Do not take your medicine more often than directed. It is important that you put your used needles and syringes in a special sharps container. Do not put them in a trash can. If you do not have a sharps container, call your pharmacist or healthcare provider to get one. Talk to your pediatrician regarding the use of this medicine in children. While this medicine may be prescribed for children as young as 8 years for selected conditions, precautions do apply. Overdosage: If you think you have taken too much of this medicine contact  a poison control center or emergency room at once. NOTE: This medicine is only for you. Do not share this medicine with others. What if I miss a dose? If you miss a dose, take it as soon as you can. If it is almost time for your next dose, take only that dose. Do not take double or extra doses. What may interact with this medicine? Do not take this medicine with any of the following medications: -chasteberry This medicine may also interact with the following medications: -herbal or dietary supplements, like black cohosh or DHEA -male hormones, like estrogens or progestins and birth control pills, patches, rings, or injections -male hormones, like testosterone This list may not describe all possible interactions. Give your health care provider a list of all the medicines, herbs, non-prescription drugs, or dietary supplements you use. Also tell them if you smoke, drink alcohol, or use illegal drugs. Some items may interact with your medicine. What should I watch for while using this medicine? Visit your doctor  or health care professional for regular checks on your progress. During the first week, your symptoms may get worse, but then will improve as you continue your treatment. You may get hot flashes, increased bone pain, increased difficulty passing urine, or an aggravation of nerve symptoms. Discuss these effects with your doctor or health care professional, some of them may improve with continued use of this medicine. Male patients may experience a menstrual cycle or spotting during the first 2 months of therapy with this medicine. If this continues, contact your doctor or health care professional. What side effects may I notice from receiving this medicine? Side effects that you should report to your doctor or health care professional as soon as possible: -allergic reactions like skin rash, itching or hives, swelling of the face, lips, or tongue -breathing problems -chest pain -depression or memory disorders -pain in your legs or groin -pain at site where injected -severe headache -swelling of the feet and legs -visual changes -vomiting Side effects that usually do not require medical attention (report to your doctor or health care professional if they continue or are bothersome): -breast swelling or tenderness -decrease in sex drive or performance -diarrhea -hot flashes -loss of appetite -muscle, joint, or bone pains -nausea -redness or irritation at site where injected -skin problems or acne This list may not describe all possible side effects. Call your doctor for medical advice about side effects. You may report side effects to FDA at 1-800-FDA-1088. Where should I keep my medicine? Keep out of the reach of children. Store below 25 degrees C (77 degrees F). Do not freeze. Protect from light. Do not use if it is not clear or if there are particles present. Throw away any unused medicine after the expiration date. NOTE: This sheet is a summary. It may not cover all possible  information. If you have questions about this medicine, talk to your doctor, pharmacist, or health care provider.  2015, [employer]. (2009-03-12 13:26:20)

## 2014-06-05 NOTE — Telephone Encounter (Signed)
Pt confirmed labs/ov per 07/28 POF, gave pt AVS....KJ °

## 2014-06-05 NOTE — Progress Notes (Signed)
[place], Clayton [name] [place] [address]  DIAGNOSIS: Prostate cancer metastatic to bone - Plan: CBC with Differential, Lactate dehydrogenase (LDH) - [facility], Comprehensive metabolic panel (Cmet) - [facility], PSA, Testosterone  Chief Complaint  Patient presents with  . Follow-up    CURRENT TREATMENT:  Observation.  INTERVAL HISTORY: Clayton [name] 80 y.o. male 80 y.o. male who was referred for further evaluation of his suspected metastatic prostate cancer and seen initially on 05/29/2014. Today, he is accompanied by his son Clayton [name].   As previously reported, Clayton [name] had a fall on the 27th of June. He reports getting his feet tangled up in bathroom and had mechanical fall. Shortly following his fall, he reported right hip pain prompting a follow up visit to ER on th 28th. He had CXR, right femur x-Abran and x-rays of his pelvis. The CXR demonstrated no acute disease. Right femur x-Omkar revealed an avulsed fracture of the right lesser trochanter which may be an intertrochanteric fracture. Attention on pelvic radiograph was recommended. He had a subsequent Ct of of his right hip confirming a displaced avulsion fracture of the RIGHT lesser trochanter. There was a crescentic sclerosis around the location of the avulsed fragment suggested an underlying osseous lesion. This crescentic sclerosis was new compared to the prior CT and was compatible with an underlying osseous metastatic lesion. Followup whole-body bone scan was recommended to assess for other foci of metastatic disease.. Bone survey was negative for lytic or sclerotic metastatic disease. He also had nuclear medicine bone scan revealing the following: 1. Uptake at the site of the fracture. 2. Uptake within the anterior inferior iliac spine on the right is concerning for metastatic disease. 3. The uptake within the right scapula may represent metastatic disease.  4. Focal uptake at T10 on the right  was not present on the prior MRI. This may represent new disease. His PSA was collected on 05/06/2014 was elevated to 70.23.   He had surgery with open reduction internal fixation right hip due to impending pathologic fracture of the right hip on 05/10/2014. This was done by Dr. Jean [name]. He was discharged to subacute rehab at [facility] on 05/14/2014. He was readmitted on 05/21/2014 to the hospitalist service (Dr. Dia [name]) with a right adductor magnus hematoma and low hemoglobin (around 5.8) due to supratherapeutic INR of 10.4. CT of abdomen and pelvis/hip at [facility] was negative for intraabdominal metastatic disease. He was maintained on coumadin due to an artificial heart valve. He was given vitamin K x 2, FFP x 1 and 3 units of packed RBCs. He was discharged back to the [facility] rehab on 05/25/2014. Son reports that a couple years ago, he had hematuria. He was told that he had elevated PSA in the past but they cannot recollect how high. He has never had a colonoscopy. He is now completely blind due to advanced macular degeneration.   He reports no recent hospitalizations or emergency room visits.  He continues rehab.  He denies any bone pain.   MEDICAL HISTORY: Past Medical History  Diagnosis Date  . CAD (coronary artery disease)     s/p CABG 1999  . HLD (hyperlipidemia)   . HTN (hypertension)   . GERD (gastroesophageal reflux disease)   . H/O hiatal hernia   . Arthritis   . Peripheral vascular disease     aneursym  . Aortic aneurysm     at least 3 cmm infrarenal AAA by  lumbar CT 05/22/13 [facility]); 41 mm proximal ascending aorta on echo 05/05/13 [facility])    INTERIM HISTORY: has HYPERLIPIDEMIA; HYPERTENSION, UNSPECIFIED; CAD, ARTERY BYPASS GRAFT; AORTIC VALVE REPLACEMENT, HX OF; Aortic valve disorders; Right inguinal hernia; Preoperative clearance; Fall; Weakness; B12 deficiency; Impaired vision; Chronic anticoagulation; Closed fracture of lesser  trochanter of femur; Fall at home; Femoral fracture; Acute on chronic renal failure; Dementia without behavioral disturbance; Acute blood loss anemia; Metastatic cancer to bone; Chronic diastolic heart failure; Acute on chronic diastolic heart failure; Anemia; and Prostate cancer metastatic to bone on his problem list.    ALLERGIES:  is allergic to tetanus toxoids.  MEDICATIONS: has a current medication list which includes the following prescription(s): aspirin ec, bicalutamide, cyanocobalamin, docusate sodium, donepezil, dorzolamide-timolol, hydrocodone-acetaminophen, latanoprost, lisinopril, meclizine, metoprolol, metoprolol, multivitamin-lutein, polyethylene glycol, and tamsulosin.  SURGICAL HISTORY:  Past Surgical History  Procedure Laterality Date  . Aortic valve replacement  1998  . Coronary artery bypass graft  1998  . Cholecystectomy    . Tonsillectomy    . US extremity*l* Left     arm  . Inguinal hernia repair  09/25/2013    with mesh  . Inguinal hernia repair Right 09/25/2013    Procedure: HERNIA REPAIR INGUINAL ADULT;  Surgeon: Clayton Burn. Clayton Dover, Clayton [name];  Location: [place];  Service: General;  Laterality: Right;  . Insertion of mesh Right 09/25/2013    Procedure: INSERTION OF MESH;  Surgeon: Clayton Burn. Clayton Dover, Clayton [name];  Location: [place];  Service: General;  Laterality: Right;  . Hand surgery Left     MULTIPLE   . Intramedullary (im) nail intertrochanteric Right 05/10/2014    Procedure: Open Reduction Internal Fixation Right Hip;  Surgeon: Clayton Rossetti, Clayton [name];  Location: [place];  Service: Orthopedics;  Laterality: Right;    REVIEW OF SYSTEMS:   Constitutional: Denies fevers, chills or abnormal weight loss Eyes: Denies blurriness of vision Ears, nose, mouth, throat, and face: Denies mucositis or sore throat Respiratory: Denies cough, dyspnea or wheezes Cardiovascular: Denies palpitation, chest discomfort or lower extremity swelling Gastrointestinal:  Denies nausea, heartburn or change  in bowel habits Skin: Denies abnormal skin rashes Lymphatics: Denies new lymphadenopathy or easy bruising Neurological:Denies numbness, tingling or new weaknesses Behavioral/Psych: Mood is stable, no new changes  All other systems were reviewed with the patient and are negative.  PHYSICAL EXAMINATION: ECOG PERFORMANCE STATUS: 2 - Symptomatic, <50% confined to bed  Blood pressure 127/53, pulse 59, temperature 98.2 F (36.8 C), temperature source Oral, resp. rate 18, height 5' 8"  (1.727 m), weight 186 lb (84.369 kg), SpO2 97.00%.  GENERAL:alert, no distress and comfortable; chronically ill appearing elderly male sitting in his wheelchair. Blind bilaterally and HOH.  SKIN: skin color, texture, turgor are normal, no rashes or significant lesions  EYES: normal, Conjunctiva are pink and non-injected, sclera clear  OROPHARYNX:no exudate, no erythema and lips, buccal mucosa, and tongue normal  NECK: supple, thyroid normal size, non-tender, without nodularity  LYMPH: no palpable lymphadenopathy in the cervical, axillary or inguinal  LUNGS: clear to auscultation and percussion with normal breathing effort  HEART: regular rate & rhythm and no murmurs and no lower extremity edema  ABDOMEN:abdomen soft, non-tender and normal bowel sounds  Musculoskeletal:no cyanosis of digits and no clubbing  NEURO: alert & oriented x 3 with fluent speech, no focal motor/sensory deficits  Labs:  Lab Results  Component Value Date   WBC 11.3* 05/24/2014   HGB 8.5* 05/24/2014   HCT 27.0* 05/24/2014   MCV 93.1 05/24/2014  PLT 358 05/24/2014   NEUTROABS 8.3* 05/23/2014      Chemistry      Component Value Date/Time   NA 140 05/24/2014 0357   K 4.7 05/24/2014 0357   CL 101 05/24/2014 0357   CO2 28 05/24/2014 0357   BUN 19 05/24/2014 0357   CREATININE 1.08 05/24/2014 0357      Component Value Date/Time   CALCIUM 8.1* 05/24/2014 0357   ALKPHOS 176* 05/24/2014 0357   AST 87* 05/24/2014 0357   ALT 62* 05/24/2014 0357    BILITOT 1.8* 05/24/2014 0357       Basic Metabolic Panel: No results found for this basename: NA, K, CL, CO2, GLUCOSE, BUN, CREATININE, CALCIUM, MG, PHOS,  in the last 168 hours GFR Estimated Creatinine Clearance: 52.9 ml/min (by C-G formula based on Cr of 1.08). Liver Function Tests: No results found for this basename: AST, ALT, ALKPHOS, BILITOT, PROT, ALBUMIN,  in the last 168 hours No results found for this basename: LIPASE, AMYLASE,  in the last 168 hours No results found for this basename: AMMONIA,  in the last 168 hours Coagulation profile No results found for this basename: INR, PROTIME,  in the last 168 hours  CBC: No results found for this basename: WBC, NEUTROABS, HGB, HCT, MCV, PLT,  in the last 168 hours  Anemia work up No results found for this basename: VITAMINB12, FOLATE, FERRITIN, TIBC, IRON, RETICCTPCT,  in the last 72 hours  Studies:  No results found.   RADIOGRAPHIC STUDIES: Dg Chest 1 View  05/06/2014   CLINICAL DATA:  Fall, right hip pain  EXAM: CHEST - 1 VIEW  COMPARISON:  10/17/2013  FINDINGS: Cardiomediastinal silhouette is stable. Status post CABG. No acute infiltrate or pleural effusion. No pulmonary edema.  IMPRESSION: No active disease.  Status post CABG.   Electronically Signed   By: Clayton Crocker M.D.   On: 05/06/2014 13:35   Dg Pelvis 1-2 Views  05/06/2014   CLINICAL DATA:  Fall  EXAM: PELVIS - 1-2 VIEW  COMPARISON:  None.  FINDINGS: There is a displaced fracture of the lesser trochanter. There is also lucency in the intertrochanteric region medially. There is slight varus deformity of the proximal femur. Impaction intertrochanteric fracture is not excluded. Osteopenia.  IMPRESSION: Avulsed fracture of the lesser trochanter.  Possible intertrochanteric femur fracture.  CT is recommended.   Electronically Signed   By: Clayton Stolp M.D.   On: 05/06/2014 13:37   Dg Femur Right  05/11/2014   CLINICAL DATA:  Right hip ORIF  EXAM: DG C-ARM 61-120 MIN; RIGHT FEMUR -  2 VIEW  : COMPARISON:  MRI of the right hip from 2 days prior  FINDINGS: Status post intra medullary nail fixation of the femur with dynamic hip screw. No evidence of periprosthetic fracture or other complication.  IMPRESSION: Right femur internal fixation.   Electronically Signed   By: Clayton Guild M.D.   On: 05/11/2014 04:04   Dg Femur Right  05/06/2014   CLINICAL DATA:  Fall  EXAM: RIGHT FEMUR - 2 VIEW  COMPARISON:  None.  FINDINGS: The lesser trochanter is avulsed and displaced superiorly. There are overlapping structures at the intertrochanteric region. There is also slight varus deformity of the proximal femur. Underlying intertrochanteric fracture is not excluded.  IMPRESSION: Avulsed fracture of the right lesser trochanter. There may be an intertrochanteric fracture. Attention on pelvic radiograph is recommended.   Electronically Signed   By: Clayton Cappuccio M.D.   On: 05/06/2014 13:35  Ct Head Wo Contrast  05/22/2014   CLINICAL DATA:  Confusion  EXAM: CT HEAD WITHOUT CONTRAST  TECHNIQUE: Contiguous axial images were obtained from the base of the skull through the vertex without intravenous contrast.  COMPARISON:  Prior CT from 05/06/2014  FINDINGS: Atrophy with chronic microvascular ischemic changes are stable from prior.  There is no acute intracranial hemorrhage or infarct. No mass lesion or midline shift. Gray-white matter differentiation is well maintained. Ventricles are normal in size without evidence of hydrocephalus. CSF containing spaces are within normal limits. No extra-axial fluid collection.  The calvarium is intact.  Orbital soft tissues are within normal limits.  The paranasal sinuses and mastoid air cells are well pneumatized and free of fluid.  Scalp soft tissues are unremarkable.  IMPRESSION: 1. No acute intracranial process. 2. Stable atrophy with moderate chronic microvascular ischemic disease.   Electronically Signed   By: Jeannine Boga M.D.   On: 05/22/2014 03:31   Ct Head  Wo Contrast  05/06/2014   CLINICAL DATA:  Fall  EXAM: CT HEAD WITHOUT CONTRAST  TECHNIQUE: Contiguous axial images were obtained from the base of the skull through the vertex without intravenous contrast.  COMPARISON:  None.  FINDINGS: Global atrophy. Chronic ischemic changes in the periventricular white matter. Cranium is intact. Mastoid air cells clear. Visualized paranasal sinuses are clear.  IMPRESSION: No acute intracranial pathology.   Electronically Signed   By: Clayton Lamar M.D.   On: 05/06/2014 13:04   Nm Bone Scan Whole Body  05/07/2014   CLINICAL DATA:  Abnormal CT scan. Question metastatic disease. Pathologic fracture.  EXAM: NUCLEAR MEDICINE WHOLE BODY BONE SCAN  TECHNIQUE: Whole body anterior and posterior images were obtained approximately 3 hours after intravenous injection of radiopharmaceutical.  RADIOPHARMACEUTICALS:  Twenty-five mCi Technetium-99 MDP  COMPARISON:  Bone survey 05/06/2014. CT of the right hip 05/06/2014. Thoracic and lumbar spine MRI 10/18/2013. Focal uptake within the scapula on the right is concerning for metastatic disease. This could be within posterior ribs. Minimal uptake is noted within the left lesser trochanter, potentially degenerative. Uptake within the medial aspect of the right knee is degenerative.  FINDINGS: Focal uptake is present along the lesser trochanter fracture and along the anterior inferior iliac spine. The former corresponds with fracture. The ladder with some degenerative change there may be a sclerotic lesion at the anterior inferior iliac spine as well. Focal uptake is present on the right at T10. There was no abnormality on the previous MRI.  IMPRESSION: 1. Uptake at the site of the fracture. 2. Uptake within the anterior inferior iliac spine on the right is concerning for metastatic disease. This corresponds with a focal sclerotic lesion. 3. The uptake within the right scapula may represent metastatic disease. 4. Focal uptake at T10 on the right was  not present on the prior MRI. This may represent new disease.   Electronically Signed   By: Clayton Santiago M.D.   On: 05/07/2014 16:52   Ct Hip Right Wo Contrast  05/06/2014   CLINICAL DATA:  RIGHT hip pain. Fracture identified on prior radiographs.  EXAM: CT OF THE RIGHT HIP WITHOUT CONTRAST  TECHNIQUE: Multidetector CT imaging was performed according to the standard protocol. Multiplanar CT image reconstructions were also generated.  COMPARISON:  Radiographs 05/06/2014. CT 05/01/2013 at [facility].  FINDINGS: There is an avulsion of the RIGHT lesser trochanter with anterior displacement of 4.3 cm. Proximal retraction is mild, measuring about 2 cm. On the coronal images, there is sclerosis  surrounding the lesser trochanter, suggesting an underlying lesion, likely metastatic disease. No intertrochanteric extension of fracture is identified. Moderate RIGHT hip osteoarthritis is present with subchondral cyst in the femoral head and acetabular roof. Fatty atrophy of the gluteus minimus muscle is present. Small nonspecific sclerotic lesion present in the RIGHT iliac bone. Ossification of the L5-S1 disc with ankylosis. Benign bone island in the RIGHT iliac crest. Aortoiliac atherosclerosis.  IMPRESSION: Displaced avulsion fracture of the RIGHT lesser trochanter. Crescentic sclerosis around the location of the avulsed fragment suggests an underlying osseous lesion. This crescentic sclerosis is new compared to the prior CT and is compatible with an underlying osseous metastatic lesion. Followup whole-body bone scan recommended to assess for other foci of metastatic disease. In this older man, PSA should also be obtained.   Electronically Signed   By: Clayton Ligas M.D.   On: 05/06/2014 15:39   Mr Hip Right Wo Contrast  05/08/2014   CLINICAL DATA:  Status post fall.  Right hip pain.  EXAM: MRI OF THE RIGHT HIP WITHOUT CONTRAST  TECHNIQUE: Multiplanar, multisequence MR imaging was performed. No intravenous  contrast was administered.  COMPARISON:  None.  FINDINGS: HIP JOINTS  Both femoral heads appear normal without evidence of acute fracture, dislocation or avascular necrosis. There is no significant hip joint effusion. There is no gross labral or paralabral abnormality.  BONY PELVIS  There is an avulsion fracture of the right lesser trochanter superiorly displaced. There is adjacent soft tissue edema. There is a 2.3 x 5.3 cm osseous lesion within the proximal right femur at site of the lesser trochanteric avulsion.  There is a 2 cm superior right acetabular T1 hypointense lesion within adjacent 14 mm lesion. There is a 11.4 cm left intertrochanteric T1 hypo intense lesion. There is a small hypo intense lesion in the left posterior acetabulum.  MUSCLES AND TENDONS  Severe edema in the right iliopsoas muscle and tendon with proximal retraction consistent with lesser trochanteric avulsion fracture. Severe soft tissue edema in the extensor and adductor compartment. The gluteus and hamstring tendons appear normal. The piriformis muscles appear symmetric.  INTERNAL PELVIC FINDINGS  The visualized internal pelvic contents are unremarkable.  IMPRESSION: 1. Right lesser trochanteric avulsion fracture with an underlying osseous lesion in the right proximal femur concerning for malignancy. 2. Multiple hypointense lesions involving the right acetabulum, left acetabulum, right proximal femur and left proximal femur most concerning for metastatic disease.   Electronically Signed   By: Clayton [name]   On: 05/08/2014 09:05   Dg Chest Portable 1 View  05/22/2014   CLINICAL DATA:  Congestion.  EXAM: PORTABLE CHEST - 1 VIEW  COMPARISON:  May 21, 2014.  FINDINGS: Stable cardiomediastinal silhouette. Status post coronary artery bypass graft. No pneumothorax or pleural effusion is noted. Mild central pulmonary vascular congestion is noted. Narrowing of right subacromial space is noted concerning for rotator cuff injury.  IMPRESSION:  Mild central pulmonary vascular congestion is noted. No other acute abnormality seen.   Electronically Signed   By: Sabino Dick M.D.   On: 05/22/2014 16:42   Dg Bone Survey Met  05/07/2014   CLINICAL DATA:  Metastatic bone survey.  EXAM: METASTATIC BONE SURVEY  COMPARISON:  CT right hip 05/06/2014  FINDINGS: No lytic or sclerotic bone lesions are identified in the axial or appendicular skeleton.  There is an avulsion fracture again demonstrated at the lesser trochanter region of the right hip.  There is degenerative joint disease.  IMPRESSION: Negative metastatic bone survey  for lytic or sclerotic metastatic disease.  Avulsion fracture at the lesser trochanter of the right hip.  Degenerative joint disease.   Electronically Signed   By: Kalman Jewels M.D.   On: 05/07/2014 07:33   Dg C-arm 1-60 Min  05/11/2014   CLINICAL DATA:  Right hip ORIF  EXAM: DG C-ARM 61-120 MIN; RIGHT FEMUR - 2 VIEW  : COMPARISON:  MRI of the right hip from 2 days prior  FINDINGS: Status post intra medullary nail fixation of the femur with dynamic hip screw. No evidence of periprosthetic fracture or other complication.  IMPRESSION: Right femur internal fixation.   Electronically Signed   By: Clayton Guild M.D.   On: 05/11/2014 04:04    ASSESSMENT: Aariv T Erekson 80 y.o. male with a history of Prostate cancer metastatic to bone - Plan: CBC with Differential, Lactate dehydrogenase (LDH) - [facility], Comprehensive metabolic panel (Cmet) - [facility], PSA, Testosterone   PLAN:   1. Probable Metastatic Prostate cancer.  --Last visit, we reviewed his images in detail with findings consistent with Stage IV disease as evidence by multiple bone metastases. Pathology was negative for malignancy. In addition, he had an elevated PSA of 70. Repeat PSA and testesterone level is pending.   --Given these findings, we explained that he likely has metastatic prostate cancer. Given his advanced age and poor functional status along with multiple  co-morbidities, family declined referral to urology for consideration of tissue biopsy which would provide definitive tissue diagnosis.    --Previously, we also reviewed the CT of abdomen and pelvis report obtained from [place] during his recent admission. It was negative for visceral disease. We discussed treatment options for stage IV prostate cancer includes ADT with Lupron plus casodex versus chemotherapy. He is not a chemotherapy candidate due to his functional status and co-morbidities. We will start on ADT today. We discussed the indications would be palliative therapy, the benefits include improved quality of life and decreased pain, and risks include but are not limited to hot flashes, weight changes, decreased libido, testicular atrophy, tumor flare (an initial rise in serum testosterone concentrations may cause including bone pain, neuropathy, hematuria, or ureteral or bladder outlet obstruction during the first 2 weeks). We will start with casodex 50 mg daily to minimize this risk. Prescription has been sent to his pharmacy.  He consented and agreed to proceed with ADT understanding the indications, benefits and risks.   --We will plan on following his PSA and testosterone levels every 4 weeks.  Consideration will also be given to adding denosumab or zometa for bone support given evidence of his bone lesions.  If progression or hormone-refractory, Xtandi or Radium 223 will also be considered.   2. Pathologic R. Hip fracture(avulsion, less trochanteric) s/p R ORIF (05/10/2014)  --Continuing physical therapy at [facility].   3. S/p complications with Hematoma, supratherapautic INR.  --He was instructed to hold a/c due to risk of bleeding. He has now resumed his anticoagulation.   4. AVR s/p mechanical valve replacement.  --Anticoagulation resumed per his son.   5. CAD/ CABG  --He is on metoprolol 25 mg bid and aspirin.   6. Blindness secondary to Macular Degeneration.   7. ECOG 2/3    8. Dementia  -- On Aricept.   9. Diastolic CHF/HTN/HLD   10.  History of Elevated transaminases NOS  --Unclear etiology. Possible hepatic congestion. We will continue to trend.   11. Normocytic anemia  --Likely anemia of chronic disease with concomitant anemia of blood  loss secondary to #3. We will continue to trend. Less likely related to his mild chronic kidney disease, CrCl is greater than 52.9 mL/min.   12. Follow up.  --The patient should obtain a baseline PSA, testosterone prior to receipt of his Lupron 22.5 mg next week. He was provided a prescription for casodex 50 mg daily for 28 days.   All questions were answered. The patient knows to call the clinic with any problems, questions or concerns. We can certainly see the patient much sooner if necessary.  I spent 15 minutes counseling the patient face to face. The total time spent in the appointment was 25 minutes.    Eulalio Reamy, Clayton [name] 06/05/2014 1:00 PM

## 2014-06-06 LAB — TESTOSTERONE: Testosterone: 168 ng/dL — ABNORMAL LOW (ref 300–890)

## 2014-06-06 LAB — PSA: PSA: 60.63 ng/mL — ABNORMAL HIGH (ref <=4.00)

## 2014-06-07 ENCOUNTER — Telehealth: Payer: Self-pay | Admitting: *Deleted

## 2014-06-07 NOTE — Telephone Encounter (Signed)
Received labs from facility and Dr Burt Knack reviewed CBC and INR and gave OK for pt to restart coumadin. Order sent to Thedore Mins, nurse at [facility] to restart previous home dose on tomorrow, 06/08/14  as per D/C hosp summary and order to recheck INR on 06/13/14 and fax results to [place].

## 2014-06-12 ENCOUNTER — Ambulatory Visit: Payer: Commercial Managed Care - HMO | Admitting: Physician Assistant

## 2014-06-14 ENCOUNTER — Telehealth: Payer: Self-pay

## 2014-06-14 NOTE — Telephone Encounter (Signed)
Called spoke with Deb at [facility] re[facility] looking for INR results drawn on 06/13/14.  INR was drawn 1.4, Dr Nyra Capes their medical MD at facility dosed Coumadin 7mg  x 2 doses, then 6mg  daily, repeat INR on Tuesday 06/19/14.  Dr Nyra Capes will continue to monitor and manage pt's Coumadin while at facility.  They will contact us for follow-up once pt is discharged from facility.

## 2014-06-18 ENCOUNTER — Ambulatory Visit (INDEPENDENT_AMBULATORY_CARE_PROVIDER_SITE_OTHER): Payer: Medicare HMO | Admitting: Physician Assistant

## 2014-06-18 ENCOUNTER — Encounter: Payer: Self-pay | Admitting: Physician Assistant

## 2014-06-18 VITALS — BP 140/70 | HR 63 | Ht 68.0 in | Wt 179.0 lb

## 2014-06-18 DIAGNOSIS — Z954 Presence of other heart-valve replacement: Secondary | ICD-10-CM

## 2014-06-18 DIAGNOSIS — C7951 Secondary malignant neoplasm of bone: Secondary | ICD-10-CM

## 2014-06-18 DIAGNOSIS — I10 Essential (primary) hypertension: Secondary | ICD-10-CM

## 2014-06-18 DIAGNOSIS — C7952 Secondary malignant neoplasm of bone marrow: Secondary | ICD-10-CM

## 2014-06-18 DIAGNOSIS — I5032 Chronic diastolic (congestive) heart failure: Secondary | ICD-10-CM

## 2014-06-18 DIAGNOSIS — I359 Nonrheumatic aortic valve disorder, unspecified: Secondary | ICD-10-CM

## 2014-06-18 DIAGNOSIS — C61 Malignant neoplasm of prostate: Secondary | ICD-10-CM

## 2014-06-18 DIAGNOSIS — I2510 Atherosclerotic heart disease of native coronary artery without angina pectoris: Secondary | ICD-10-CM

## 2014-06-18 DIAGNOSIS — D62 Acute posthemorrhagic anemia: Secondary | ICD-10-CM

## 2014-06-18 NOTE — Patient Instructions (Signed)
Your physician recommends that you continue on your current medications as directed. Please refer to the Current Medication list given to you today.  Your physician wants you to follow-up in: December 2015 with Dr. Burt Knack. You will receive a reminder letter in the mail two months in advance. If you don't receive a letter, please call our office to schedule the follow-up appointment.

## 2014-06-18 NOTE — Progress Notes (Signed)
Cardiology Office Note    Date:  06/18/2014   ID:  Clayton [name], DOB 11-13-1925, MRN [id number]  PCP:  [name], MD  Cardiologist:  Dr. [name]      History of Present Illness: Clayton [name] is a 80 y.o. male with a hx of remote CABG + AVR with St Jude mechanical valve in 6387, diastolic CHF, 3 cm AAA by CT 05/2013, HTN, HL, GERD.  Admitted 7/13-7/17 with anemia in the setting of supratherapeutic INR.  He had ORIF for for R femur fx (pathologic 2/2 metastatic prostate CA) in 05/2014.  Post op course c/b volume excess and ABL anemia.  In rehab, he was noted to be pale and lethargic >>> Hgb 5.  He was noted to have bleeding into his surgical hip wound.  Coumadin was placed on hold and INR was reversed with FFP, Vit K.  He received 2 units PRBCs.   Patient did see Dr. Liam Rogers in the hospital.  DC notes indicate case was reviewed with Dr. [name] and plan was to resume coumadin 2 weeks post DC.  He returns for follow up.    He is now back on Coumadin.  He is staying at [facility] in [place].  His is blind and HOH. His wife helps with the hx. He is not having any chest pain or significant dyspnea.  He did have some LE edema that improved with LE compression stockings.  He denies orthopnea.  He denies syncope.  No reports of bleeding.  He is working with PT and walking some.  His appetite is improving.     Studies:  - Echo (6/14):  EF 56-43%, grade 1 diastolic dysfunction, AV prosthesis difficult to see (mean gradient 6 mm Hg)    Recent Labs/Images: 10/18/2013: TSH 1.150  05/22/2014: Pro B Natriuretic peptide (BNP) 3017.0*  05/24/2014: ALT 62*; Creatinine 1.08; Hemoglobin 8.5*; Potassium 4.7   Ct Head Wo Contrast  05/22/2014   CLINICAL DATA:  Confusion  EXAM: CT HEAD WITHOUT CONTRAST  TECHNIQUE: Contiguous axial images were obtained from the base of the skull through the vertex without intravenous contrast.  COMPARISON:  Prior CT from 05/06/2014  FINDINGS:  Atrophy with chronic microvascular ischemic changes are stable from prior.  There is no acute intracranial hemorrhage or infarct. No mass lesion or midline shift. Gray-white matter differentiation is well maintained. Ventricles are normal in size without evidence of hydrocephalus. CSF containing spaces are within normal limits. No extra-axial fluid collection.  The calvarium is intact.  Orbital soft tissues are within normal limits.  The paranasal sinuses and mastoid air cells are well pneumatized and free of fluid.  Scalp soft tissues are unremarkable.  IMPRESSION: 1. No acute intracranial process. 2. Stable atrophy with moderate chronic microvascular ischemic disease.   Electronically Signed   By: Jeannine Boga M.D.   On: 05/22/2014 03:31   Dg Chest Portable 1 View  05/22/2014   CLINICAL DATA:  Congestion.  EXAM: PORTABLE CHEST - 1 VIEW  COMPARISON:  May 21, 2014.  FINDINGS: Stable cardiomediastinal silhouette. Status post coronary artery bypass graft. No pneumothorax or pleural effusion is noted. Mild central pulmonary vascular congestion is noted. Narrowing of right subacromial space is noted concerning for rotator cuff injury.  IMPRESSION: Mild central pulmonary vascular congestion is noted. No other acute abnormality seen.   Electronically Signed   By: Sabino Dick M.D.   On: 05/22/2014 16:42     Wt Readings from Last 3  Encounters:  06/05/14 186 lb (84.369 kg)  05/29/14 196 lb (88.905 kg)  05/22/14 205 lb 0.4 oz (93 kg)     Past Medical History  Diagnosis Date  . CAD (coronary artery disease)     s/p CABG 1999  . HLD (hyperlipidemia)   . HTN (hypertension)   . GERD (gastroesophageal reflux disease)   . H/O hiatal hernia   . Arthritis   . Peripheral vascular disease     aneursym  . Aortic aneurysm     at least 3 cmm infrarenal AAA by lumbar CT 05/22/13 [facility] 41 mm proximal ascending aorta on echo 05/05/13 [facility])    Current Outpatient Prescriptions    Medication Sig Dispense Refill  . aspirin EC 81 MG tablet Take 81 mg by mouth daily.      . bicalutamide (CASODEX) 50 MG tablet Take 1 tablet (50 mg total) by mouth daily.  28 tablet  0  . cyanocobalamin (,VITAMIN B-12,) 1000 MCG/ML injection Inject 1,000 mcg into the muscle See admin instructions. 1000 mcg IM daily for 5 days (received 7/7-7/11), then weekly for 3 weeks (first dose due 7/18), then monthly      . docusate sodium (COLACE) 100 MG capsule Take 100 mg by mouth 2 (two) times daily.      Marland Kitchen donepezil (ARICEPT) 5 MG tablet Take 5 mg by mouth at bedtime.       . dorzolamide-timolol (COSOPT) 22.3-6.8 MG/ML ophthalmic solution Place 1 drop into both eyes 2 (two) times daily.       Marland Kitchen HYDROcodone-acetaminophen (NORCO/VICODIN) 5-325 MG per tablet Take 1-2 tablets by mouth every 4 (four) hours as needed (1 tablet - mild pain; 2 tablets mod-severe pain).      Marland Kitchen latanoprost (XALATAN) 0.005 % ophthalmic solution Place 1 drop into both eyes at bedtime.       Marland Kitchen lisinopril (PRINIVIL,ZESTRIL) 10 MG tablet Take 1 tablet by mouth daily.      . meclizine (ANTIVERT) 25 MG tablet Take 25 mg by mouth daily as needed for dizziness or nausea.       . metoprolol (LOPRESSOR) 100 MG tablet Take 100 mg by mouth daily.       . metoprolol (LOPRESSOR) 50 MG tablet Take 50 mg by mouth at bedtime.      . multivitamin-lutein (OCUVITE-LUTEIN) CAPS Take 1 capsule by mouth 2 (two) times daily.        . polyethylene glycol (MIRALAX / GLYCOLAX) packet Take 17 g by mouth daily as needed for mild constipation.      . Tamsulosin HCl (FLOMAX) 0.4 MG CAPS Take 0.4 mg by mouth at bedtime.        No current facility-administered medications for this visit.     Allergies:   Tetanus toxoids   Social History:  The patient  reports that he has quit smoking. He has never used smokeless tobacco. He reports that he does not drink alcohol or use illicit drugs.   Family History:  The patient's family history includes Coronary artery  disease in an other family member.   ROS:  Please see the history of present illness.      All other systems reviewed and negative.   PHYSICAL EXAM: VS:  BP 140/70  Pulse 63  Ht 5\' 8"  (1.727 m)  Wt 179 lb (81.194 kg)  BMI 27.22 kg/m2 Well nourished, well developed, in no acute distress HEENT: normal Neck: no JVDat 90 degrees  Cardiac:  normal S1, mechanical S2; RRR;  no murmur Lungs:  Decreased breath sounds bilaterally, no wheezing, rhonchi or rales Abd: soft, nontender, no hepatomegaly Ext: trace bilateral LE edema Skin: warm and dry Neuro:  CNs 2-12 intact, no focal abnormalities noted  EKG:  NSR, HR 63, no ST changes     ASSESSMENT AND PLAN:  Aortic valve disorders s/p St Jude AVR:  He is now back on coumadin and appears to be tolerating.  INR is managed by attending MD at his rehab facility.  Continue SBE prophylaxis.  Chronic diastolic heart failure:  Volume stable.   Atherosclerosis of native coronary artery of native heart without angina pectoris:  No apparent angina.  Continue current regimen.   HYPERTENSION, UNSPECIFIED:  Controlled.   Prostate cancer metastatic to bone:  F/u with urology and oncology.   Acute blood loss anemia:  Improved.  Hgb recently 10.5.      Disposition:  F/u with Dr. [name] in 10/2014 as planned.    Signed, Versie Starks, MHS 06/18/2014 3:09 PM    [facility] [address], [place], [place]  [address] Phone: [phone]; Fax: [phone]

## 2014-07-05 ENCOUNTER — Telehealth: Payer: Self-pay | Admitting: Internal Medicine

## 2014-07-05 ENCOUNTER — Ambulatory Visit (HOSPITAL_BASED_OUTPATIENT_CLINIC_OR_DEPARTMENT_OTHER): Payer: Medicare HMO | Admitting: Internal Medicine

## 2014-07-05 ENCOUNTER — Encounter: Payer: Self-pay | Admitting: Internal Medicine

## 2014-07-05 ENCOUNTER — Other Ambulatory Visit (HOSPITAL_BASED_OUTPATIENT_CLINIC_OR_DEPARTMENT_OTHER): Payer: Medicare HMO

## 2014-07-05 VITALS — BP 134/59 | HR 58 | Temp 98.0°F | Resp 20 | Ht 68.0 in | Wt 180.7 lb

## 2014-07-05 DIAGNOSIS — C61 Malignant neoplasm of prostate: Secondary | ICD-10-CM

## 2014-07-05 DIAGNOSIS — C7952 Secondary malignant neoplasm of bone marrow: Secondary | ICD-10-CM

## 2014-07-05 DIAGNOSIS — D649 Anemia, unspecified: Secondary | ICD-10-CM

## 2014-07-05 DIAGNOSIS — C7951 Secondary malignant neoplasm of bone: Principal | ICD-10-CM

## 2014-07-05 LAB — LACTATE DEHYDROGENASE (CC13): LDH: 330 U/L — AB (ref 125–245)

## 2014-07-05 LAB — CBC WITH DIFFERENTIAL/PLATELET
BASO%: 0.3 % (ref 0.0–2.0)
BASOS ABS: 0 10*3/uL (ref 0.0–0.1)
EOS%: 4.3 % (ref 0.0–7.0)
Eosinophils Absolute: 0.3 10*3/uL (ref 0.0–0.5)
HCT: 38.7 % (ref 38.4–49.9)
HGB: 12.1 g/dL — ABNORMAL LOW (ref 13.0–17.1)
LYMPH%: 23.2 % (ref 14.0–49.0)
MCH: 29.4 pg (ref 27.2–33.4)
MCHC: 31.3 g/dL — AB (ref 32.0–36.0)
MCV: 94.2 fL (ref 79.3–98.0)
MONO#: 0.6 10*3/uL (ref 0.1–0.9)
MONO%: 7.7 % (ref 0.0–14.0)
NEUT#: 5 10*3/uL (ref 1.5–6.5)
NEUT%: 64.5 % (ref 39.0–75.0)
Platelets: 172 10*3/uL (ref 140–400)
RBC: 4.11 10*6/uL — ABNORMAL LOW (ref 4.20–5.82)
RDW: 15.2 % — AB (ref 11.0–14.6)
WBC: 7.7 10*3/uL (ref 4.0–10.3)
lymph#: 1.8 10*3/uL (ref 0.9–3.3)

## 2014-07-05 LAB — COMPREHENSIVE METABOLIC PANEL (CC13)
ALBUMIN: 3.2 g/dL — AB (ref 3.5–5.0)
ALK PHOS: 327 U/L — AB (ref 40–150)
ALT: 16 U/L (ref 0–55)
AST: 24 U/L (ref 5–34)
Anion Gap: 8 mEq/L (ref 3–11)
BUN: 28.4 mg/dL — ABNORMAL HIGH (ref 7.0–26.0)
CO2: 28 mEq/L (ref 22–29)
Calcium: 8.9 mg/dL (ref 8.4–10.4)
Chloride: 104 mEq/L (ref 98–109)
Creatinine: 1.2 mg/dL (ref 0.7–1.3)
Glucose: 139 mg/dl (ref 70–140)
POTASSIUM: 3.9 meq/L (ref 3.5–5.1)
Sodium: 140 mEq/L (ref 136–145)
Total Bilirubin: 0.76 mg/dL (ref 0.20–1.20)
Total Protein: 7.3 g/dL (ref 6.4–8.3)

## 2014-07-05 NOTE — Telephone Encounter (Signed)
Pt confirmed labs/ov per 08/27 POF, gave pt AVS...KJ °

## 2014-07-05 NOTE — Progress Notes (Signed)
[place], MD [place] [address]  DIAGNOSIS: Prostate cancer metastatic to bone - Plan: CBC with Differential, Comprehensive metabolic panel (Cmet) - [facility], Lactate dehydrogenase (LDH) - [facility], PSA, Testosterone  Metastatic cancer to bone  Chief Complaint  Patient presents with  . Prostate cancer metastatic to bone    CURRENT TREATMENT:  Lupron 22.5 mg q 3 months,started on 06/05/2014.  Next one on 09/05/2014. Completed one month of casodex 50 mg daily on 06/29/2014.   INTERVAL HISTORY: Clayton [name] 80 y.o. male 80 y.o. male who was referred for further evaluation of his suspected metastatic prostate cancer and seen initially on 06/05/2014. Today, he is accompanied by his son Clayton [name].   As previously reported, Clayton [name] had a fall on the 27th of June. He reports getting his feet tangled up in bathroom and had mechanical fall. Shortly following his fall, he reported right hip pain prompting a follow up visit to ER on th 28th. He had CXR, right femur x-Raquel and x-rays of his pelvis. The CXR demonstrated no acute disease. Right femur x-Lavante revealed an avulsed fracture of the right lesser trochanter which may be an intertrochanteric fracture. Attention on pelvic radiograph was recommended. He had a subsequent Ct of of his right hip confirming a displaced avulsion fracture of the RIGHT lesser trochanter. There was a crescentic sclerosis around the location of the avulsed fragment suggested an underlying osseous lesion. This crescentic sclerosis was new compared to the prior CT and was compatible with an underlying osseous metastatic lesion. Followup whole-body bone scan was recommended to assess for other foci of metastatic disease.. Bone survey was negative for lytic or sclerotic metastatic disease. He also had nuclear medicine bone scan revealing the following: 1. Uptake at the site of the fracture. 2. Uptake within the anterior inferior  iliac spine on the right is concerning for metastatic disease. 3. The uptake within the right scapula may represent metastatic disease.  4. Focal uptake at T10 on the right was not present on the prior MRI. This may represent new disease. His PSA was collected on 05/06/2014 was elevated to 70.23.   He had surgery with open reduction internal fixation right hip due to impending pathologic fracture of the right hip on 05/10/2014. This was done by Dr. Jean Rosenthal. He was discharged to subacute rehab at [facility] on 05/14/2014. He was readmitted on 05/21/2014 to the hospitalist service (Dr. Dia Crawford) with a right adductor magnus hematoma and low hemoglobin (around 5.8) due to supratherapeutic INR of 10.4. CT of abdomen and pelvis/hip at [facility] was negative for intraabdominal metastatic disease. He was maintained on coumadin due to an artificial heart valve. He was given vitamin K x 2, FFP x 1 and 3 units of packed RBCs. He was discharged back to the [facility] rehab on 05/25/2014. Son reports that a couple years ago, he had hematuria. He was told that he had elevated PSA in the past but they cannot recollect how high. He has never had a colonoscopy. He is now completely blind due to advanced macular degeneration.   Today, he reports no recent hospitalizations or emergency room visits.  He continues rehab.  He denies any bone pain or hot flashes.   MEDICAL HISTORY: Past Medical History  Diagnosis Date  . CAD (coronary artery disease)     s/p CABG 1999  . HLD (hyperlipidemia)   . HTN (hypertension)   . GERD (gastroesophageal reflux disease)   .  H/O hiatal hernia   . Arthritis   . Peripheral vascular disease     aneursym  . Aortic aneurysm     at least 3 cmm infrarenal AAA by lumbar CT 05/22/13 [facility]); 41 mm proximal ascending aorta on echo 05/05/13 [facility])    INTERIM HISTORY: has HYPERLIPIDEMIA; HYPERTENSION, UNSPECIFIED; Coronary atherosclerosis of native  coronary artery; s/p [employer] AVR; Aortic valve disorders; Right inguinal hernia; Preoperative clearance; Fall; Weakness; B12 deficiency; Impaired vision; Chronic anticoagulation; Closed fracture of lesser trochanter of femur; Fall at home; Femoral fracture; Acute on chronic renal failure; Dementia without behavioral disturbance; Acute blood loss anemia; Metastatic cancer to bone; Chronic diastolic heart failure; Acute on chronic diastolic heart failure; Anemia; and Prostate cancer metastatic to bone on his problem list.    ALLERGIES:  is allergic to tetanus toxoids.  MEDICATIONS: has a current medication list which includes the following prescription(s): aspirin ec, cyanocobalamin, docusate sodium, donepezil, dorzolamide-timolol, hydrocodone-acetaminophen, latanoprost, lisinopril, meclizine, metoprolol, metoprolol, mirtazapine, multivitamin-lutein, omeprazole, polyethylene glycol, tamsulosin, and warfarin.  SURGICAL HISTORY:  Past Surgical History  Procedure Laterality Date  . Aortic valve replacement  1998  . Coronary artery bypass graft  1998  . Cholecystectomy    . Tonsillectomy    . US extremity*l* Left     arm  . Inguinal hernia repair  09/25/2013    with mesh  . Inguinal hernia repair Right 09/25/2013    Procedure: HERNIA REPAIR INGUINAL ADULT;  Surgeon: Imogene Burn. Georgette Dover, MD;  Location: [place];  Service: General;  Laterality: Right;  . Insertion of mesh Right 09/25/2013    Procedure: INSERTION OF MESH;  Surgeon: Imogene Burn. Georgette Dover, MD;  Location: [facility];  Service: General;  Laterality: Right;  . Hand surgery Left     MULTIPLE   . Intramedullary (im) nail intertrochanteric Right 05/10/2014    Procedure: Open Reduction Internal Fixation Right Hip;  Surgeon: Mcarthur Rossetti, MD;  Location: [place];  Service: Orthopedics;  Laterality: Right;    REVIEW OF SYSTEMS:   Constitutional: Denies fevers, chills or abnormal weight loss Eyes: Denies blurriness of vision Ears, nose, mouth, throat,  and face: Denies mucositis or sore throat Respiratory: Denies cough, dyspnea or wheezes Cardiovascular: Denies palpitation, chest discomfort or lower extremity swelling Gastrointestinal:  Denies nausea, heartburn or change in bowel habits Skin: Denies abnormal skin rashes Lymphatics: Denies new lymphadenopathy or easy bruising Neurological:Denies numbness, tingling or new weaknesses Behavioral/Psych: Mood is stable, no new changes  All other systems were reviewed with the patient and are negative.  PHYSICAL EXAMINATION: ECOG PERFORMANCE STATUS: 3 - Symptomatic, >50% confined to bed  Blood pressure 134/59, pulse 58, temperature 98 F (36.7 C), temperature source Oral, resp. rate 20, height 5' 8" (1.727 m), weight 180 lb 11.2 oz (81.965 kg).  GENERAL:alert, no distress and comfortable; chronically ill appearing elderly male sitting in his wheelchair. Blind bilaterally and HOH.  SKIN: skin color, texture, turgor are normal, no rashes or significant lesions  EYES: normal, Conjunctiva are pink and non-injected, sclera clear  OROPHARYNX:no exudate, no erythema and lips, buccal mucosa, and tongue normal  NECK: supple, thyroid normal size, non-tender, without nodularity  LYMPH: no palpable lymphadenopathy in the cervical, axillary or inguinal  LUNGS: clear to auscultation and percussion with normal breathing effort  HEART: regular rate & rhythm and no murmurs and no lower extremity edema  ABDOMEN:abdomen soft, non-tender and normal bowel sounds  Musculoskeletal:no cyanosis of digits and no clubbing  NEURO: alert & oriented x 3  with fluent speech, no focal motor/sensory deficits  Labs:  Lab Results  Component Value Date   WBC 7.7 07/05/2014   HGB 12.1* 07/05/2014   HCT 38.7 07/05/2014   MCV 94.2 07/05/2014   PLT 172 07/05/2014   NEUTROABS 5.0 07/05/2014      Chemistry      Component Value Date/Time   NA 140 07/05/2014 0853   NA 140 05/24/2014 0357   K 3.9 07/05/2014 0853   K 4.7 05/24/2014  0357   CL 101 05/24/2014 0357   CO2 28 07/05/2014 0853   CO2 28 05/24/2014 0357   BUN 28.4* 07/05/2014 0853   BUN 19 05/24/2014 0357   CREATININE 1.2 07/05/2014 0853   CREATININE 1.08 05/24/2014 0357      Component Value Date/Time   CALCIUM 8.9 07/05/2014 0853   CALCIUM 8.1* 05/24/2014 0357   ALKPHOS 327* 07/05/2014 0853   ALKPHOS 176* 05/24/2014 0357   AST 24 07/05/2014 0853   AST 87* 05/24/2014 0357   ALT 16 07/05/2014 0853   ALT 62* 05/24/2014 0357   BILITOT 0.76 07/05/2014 0853   BILITOT 1.8* 05/24/2014 0357     CBC:  Recent Labs Lab 07/05/14 0853  WBC 7.7  NEUTROABS 5.0  HGB 12.1*  HCT 38.7  MCV 94.2  PLT 172    Anemia work up No results found for this basename: VITAMINB12, FOLATE, FERRITIN, TIBC, IRON, RETICCTPCT,  in the last 72 hours  Studies:  No results found.   RADIOGRAPHIC STUDIES: Dg Chest 1 View  05/06/2014   CLINICAL DATA:  Fall, right hip pain  EXAM: CHEST - 1 VIEW  COMPARISON:  10/17/2013  FINDINGS: Cardiomediastinal silhouette is stable. Status post CABG. No acute infiltrate or pleural effusion. No pulmonary edema.  IMPRESSION: No active disease.  Status post CABG.   Electronically Signed   By: Liviu  Pop M.D.   On: 05/06/2014 13:35   Dg Pelvis 1-2 Views  05/06/2014   CLINICAL DATA:  Fall  EXAM: PELVIS - 1-2 VIEW  COMPARISON:  None.  FINDINGS: There is a displaced fracture of the lesser trochanter. There is also lucency in the intertrochanteric region medially. There is slight varus deformity of the proximal femur. Impaction intertrochanteric fracture is not excluded. Osteopenia.  IMPRESSION: Avulsed fracture of the lesser trochanter.  Possible intertrochanteric femur fracture.  CT is recommended.   Electronically Signed   By: Art  Hoss M.D.   On: 05/06/2014 13:37   Dg Femur Right  05/11/2014   CLINICAL DATA:  Right hip ORIF  EXAM: DG C-ARM 61-120 MIN; RIGHT FEMUR - 2 VIEW  : COMPARISON:  MRI of the right hip from 2 days prior  FINDINGS: Status post intra medullary  nail fixation of the femur with dynamic hip screw. No evidence of periprosthetic fracture or other complication.  IMPRESSION: Right femur internal fixation.   Electronically Signed   By: Jonathan  Watts M.D.   On: 05/11/2014 04:04   Dg Femur Right  05/06/2014   CLINICAL DATA:  Fall  EXAM: RIGHT FEMUR - 2 VIEW  COMPARISON:  None.  FINDINGS: The lesser trochanter is avulsed and displaced superiorly. There are overlapping structures at the intertrochanteric region. There is also slight varus deformity of the proximal femur. Underlying intertrochanteric fracture is not excluded.  IMPRESSION: Avulsed fracture of the right lesser trochanter. There may be an intertrochanteric fracture. Attention on pelvic radiograph is recommended.   Electronically Signed   By: Art  Hoss M.D.   On: 05/06/2014 13:35     Ct Head Wo Contrast  05/22/2014   CLINICAL DATA:  Confusion  EXAM: CT HEAD WITHOUT CONTRAST  TECHNIQUE: Contiguous axial images were obtained from the base of the skull through the vertex without intravenous contrast.  COMPARISON:  Prior CT from 05/06/2014  FINDINGS: Atrophy with chronic microvascular ischemic changes are stable from prior.  There is no acute intracranial hemorrhage or infarct. No mass lesion or midline shift. Gray-white matter differentiation is well maintained. Ventricles are normal in size without evidence of hydrocephalus. CSF containing spaces are within normal limits. No extra-axial fluid collection.  The calvarium is intact.  Orbital soft tissues are within normal limits.  The paranasal sinuses and mastoid air cells are well pneumatized and free of fluid.  Scalp soft tissues are unremarkable.  IMPRESSION: 1. No acute intracranial process. 2. Stable atrophy with moderate chronic microvascular ischemic disease.   Electronically Signed   By: Jeannine Boga M.D.   On: 05/22/2014 03:31   Ct Head Wo Contrast  05/06/2014   CLINICAL DATA:  Fall  EXAM: CT HEAD WITHOUT CONTRAST  TECHNIQUE:  Contiguous axial images were obtained from the base of the skull through the vertex without intravenous contrast.  COMPARISON:  None.  FINDINGS: Global atrophy. Chronic ischemic changes in the periventricular white matter. Cranium is intact. Mastoid air cells clear. Visualized paranasal sinuses are clear.  IMPRESSION: No acute intracranial pathology.   Electronically Signed   By: Maryclare Biehl M.D.   On: 05/06/2014 13:04   Nm Bone Scan Whole Body  05/07/2014   CLINICAL DATA:  Abnormal CT scan. Question metastatic disease. Pathologic fracture.  EXAM: NUCLEAR MEDICINE WHOLE BODY BONE SCAN  TECHNIQUE: Whole body anterior and posterior images were obtained approximately 3 hours after intravenous injection of radiopharmaceutical.  RADIOPHARMACEUTICALS:  Twenty-five mCi Technetium-99 MDP  COMPARISON:  Bone survey 05/06/2014. CT of the right hip 05/06/2014. Thoracic and lumbar spine MRI 10/18/2013. Focal uptake within the scapula on the right is concerning for metastatic disease. This could be within posterior ribs. Minimal uptake is noted within the left lesser trochanter, potentially degenerative. Uptake within the medial aspect of the right knee is degenerative.  FINDINGS: Focal uptake is present along the lesser trochanter fracture and along the anterior inferior iliac spine. The former corresponds with fracture. The ladder with some degenerative change there may be a sclerotic lesion at the anterior inferior iliac spine as well. Focal uptake is present on the right at T10. There was no abnormality on the previous MRI.  IMPRESSION: 1. Uptake at the site of the fracture. 2. Uptake within the anterior inferior iliac spine on the right is concerning for metastatic disease. This corresponds with a focal sclerotic lesion. 3. The uptake within the right scapula may represent metastatic disease. 4. Focal uptake at T10 on the right was not present on the prior MRI. This may represent new disease.   Electronically Signed   By:  Lawrence Santiago M.D.   On: 05/07/2014 16:52   Ct Hip Right Wo Contrast  05/06/2014   CLINICAL DATA:  RIGHT hip pain. Fracture identified on prior radiographs.  EXAM: CT OF THE RIGHT HIP WITHOUT CONTRAST  TECHNIQUE: Multidetector CT imaging was performed according to the standard protocol. Multiplanar CT image reconstructions were also generated.  COMPARISON:  Radiographs 05/06/2014. CT 05/01/2013 at [facility].  FINDINGS: There is an avulsion of the RIGHT lesser trochanter with anterior displacement of 4.3 cm. Proximal retraction is mild, measuring about 2 cm. On the coronal images, there is sclerosis  surrounding the lesser trochanter, suggesting an underlying lesion, likely metastatic disease. No intertrochanteric extension of fracture is identified. Moderate RIGHT hip osteoarthritis is present with subchondral cyst in the femoral head and acetabular roof. Fatty atrophy of the gluteus minimus muscle is present. Small nonspecific sclerotic lesion present in the RIGHT iliac bone. Ossification of the L5-S1 disc with ankylosis. Benign bone island in the RIGHT iliac crest. Aortoiliac atherosclerosis.  IMPRESSION: Displaced avulsion fracture of the RIGHT lesser trochanter. Crescentic sclerosis around the location of the avulsed fragment suggests an underlying osseous lesion. This crescentic sclerosis is new compared to the prior CT and is compatible with an underlying osseous metastatic lesion. Followup whole-body bone scan recommended to assess for other foci of metastatic disease. In this older man, PSA should also be obtained.   Electronically Signed   By: Dereck Ligas M.D.   On: 05/06/2014 15:39   Mr Hip Right Wo Contrast  05/08/2014   CLINICAL DATA:  Status post fall.  Right hip pain.  EXAM: MRI OF THE RIGHT HIP WITHOUT CONTRAST  TECHNIQUE: Multiplanar, multisequence MR imaging was performed. No intravenous contrast was administered.  COMPARISON:  None.  FINDINGS: HIP JOINTS  Both femoral heads appear  normal without evidence of acute fracture, dislocation or avascular necrosis. There is no significant hip joint effusion. There is no gross labral or paralabral abnormality.  BONY PELVIS  There is an avulsion fracture of the right lesser trochanter superiorly displaced. There is adjacent soft tissue edema. There is a 2.3 x 5.3 cm osseous lesion within the proximal right femur at site of the lesser trochanteric avulsion.  There is a 2 cm superior right acetabular T1 hypointense lesion within adjacent 14 mm lesion. There is a 11.4 cm left intertrochanteric T1 hypo intense lesion. There is a small hypo intense lesion in the left posterior acetabulum.  MUSCLES AND TENDONS  Severe edema in the right iliopsoas muscle and tendon with proximal retraction consistent with lesser trochanteric avulsion fracture. Severe soft tissue edema in the extensor and adductor compartment. The gluteus and hamstring tendons appear normal. The piriformis muscles appear symmetric.  INTERNAL PELVIC FINDINGS  The visualized internal pelvic contents are unremarkable.  IMPRESSION: 1. Right lesser trochanteric avulsion fracture with an underlying osseous lesion in the right proximal femur concerning for malignancy. 2. Multiple hypointense lesions involving the right acetabulum, left acetabulum, right proximal femur and left proximal femur most concerning for metastatic disease.   Electronically Signed   By: Kathreen Devoid   On: 05/08/2014 09:05   Dg Chest Portable 1 View  05/22/2014   CLINICAL DATA:  Congestion.  EXAM: PORTABLE CHEST - 1 VIEW  COMPARISON:  May 21, 2014.  FINDINGS: Stable cardiomediastinal silhouette. Status post coronary artery bypass graft. No pneumothorax or pleural effusion is noted. Mild central pulmonary vascular congestion is noted. Narrowing of right subacromial space is noted concerning for rotator cuff injury.  IMPRESSION: Mild central pulmonary vascular congestion is noted. No other acute abnormality seen.    Electronically Signed   By: Sabino Dick M.D.   On: 05/22/2014 16:42   Dg Bone Survey Met  05/07/2014   CLINICAL DATA:  Metastatic bone survey.  EXAM: METASTATIC BONE SURVEY  COMPARISON:  CT right hip 05/06/2014  FINDINGS: No lytic or sclerotic bone lesions are identified in the axial or appendicular skeleton.  There is an avulsion fracture again demonstrated at the lesser trochanter region of the right hip.  There is degenerative joint disease.  IMPRESSION: Negative metastatic bone survey  for lytic or sclerotic metastatic disease.  Avulsion fracture at the lesser trochanter of the right hip.  Degenerative joint disease.   Electronically Signed   By: Mark  Gallerani M.D.   On: 05/07/2014 07:33   Dg C-arm 1-60 Min  05/11/2014   CLINICAL DATA:  Right hip ORIF  EXAM: DG C-ARM 61-120 MIN; RIGHT FEMUR - 2 VIEW  : COMPARISON:  MRI of the right hip from 2 days prior  FINDINGS: Status post intra medullary nail fixation of the femur with dynamic hip screw. No evidence of periprosthetic fracture or other complication.  IMPRESSION: Right femur internal fixation.   Electronically Signed   By: Jonathan  Watts M.D.   On: 05/11/2014 04:04    ASSESSMENT: Frandy T Khawaja 80 y.o. male with a history of Prostate cancer metastatic to bone - Plan: CBC with Differential, Comprehensive metabolic panel (Cmet) - [facility], Lactate dehydrogenase (LDH) - [facility], PSA, Testosterone  Metastatic cancer to bone   PLAN:   1. Probable Metastatic Prostate cancer.  --Last visit, we reviewed his images in detail with findings consistent with Stage IV disease as evidence by multiple bone metastases. Pathology was negative for malignancy. In addition, he had an elevated PSA of 60.63 on 06/07/2014. Testesterone level was 168 on 06/07/2014 prior to receipt of lupron. Given these findings, we explained that he likely has metastatic prostate cancer. Given his advanced age and poor functional status along with multiple co-morbidities, family declined  referral to urology for consideration of tissue biopsy which would provide definitive tissue diagnosis.    --Previously, we also reviewed the CT of abdomen and pelvis report obtained from [facility] during his recent admission. It was negative for visceral disease. We discussed treatment options for stage IV prostate cancer includes ADT with Lupron plus casodex versus chemotherapy. He is not a chemotherapy candidate due to his functional status and co-morbidities. We started on ADT on 06/05/2014 We discussed the indications would be palliative therapy, the benefits included improved quality of life and decreased pain, and risks include but are not limited to hot flashes, weight changes, decreased libido, testicular atrophy, tumor flare (an initial rise in serum testosterone concentrations may cause including bone pain, neuropathy, hematuria, or ureteral or bladder outlet obstruction during the first 2 weeks). We started casodex 50 mg daily to minimize this risk and he completed it on 06/29/2014.   He consented and agreed to proceed with ADT understanding the indications, benefits and risks.   --We will plan on following his PSA and testosterone levels every 4 weeks.  Consideration will also be given to adding denosumab or zometa for bone support given evidence of his bone lesions.  If progression or hormone-refractory, Xtandi or Radium 223 will also be considered.   2. Pathologic R. Hip fracture(avulsion, less trochanteric) s/p R ORIF (05/10/2014)  --Continuing physical therapy at [facility].   3. S/p complications with Hematoma, supratherapautic INR.  -- He has now resumed his anticoagulation.   4. AVR s/p mechanical valve replacement.  --Anticoagulation resumed per his son.   5. CAD/ CABG  --He is on metoprolol 25 mg bid and aspirin.   6. Blindness secondary to Macular Degeneration.   7. ECOG 3.  8. Dementia  -- On Aricept.   9. Diastolic CHF/HTN/HLD   10.  History of Elevated  transaminases NOS  --Unclear etiology. Possible hepatic congestion. We will continue to trend.   11. Normocytic anemia  --Likely anemia of chronic disease with concomitant anemia of blood loss secondary to #3.   We will continue to trend. Less likely related to his mild chronic kidney disease, CrCl is greater than 43.5 mL/min.   12. Follow up.  --PSA, testesterone monthly and Lupron 22.5 mg q 3 months.   All questions were answered. The patient knows to call the clinic with any problems, questions or concerns. We can certainly see the patient much sooner if necessary.  I spent 15 minutes counseling the patient face to face. The total time spent in the appointment was 25 minutes.    , , MD 07/05/2014 12:11 PM  

## 2014-07-06 LAB — TESTOSTERONE: Testosterone: 24 ng/dL — ABNORMAL LOW (ref 300–890)

## 2014-07-06 LAB — PSA: PSA: 10.66 ng/mL — ABNORMAL HIGH (ref <=4.00)

## 2014-08-01 ENCOUNTER — Telehealth: Payer: Self-pay | Admitting: Hematology

## 2014-08-01 NOTE — Telephone Encounter (Signed)
CHANGED TIME OF 9/24 APPTS DUE TO AS WILL NOT BE IN UNTIL 10AM. S/W PT WIFE RE CHANGE AND NEW TIME FOR LB/FU 9*24 @ 2PM.

## 2014-08-02 ENCOUNTER — Other Ambulatory Visit (HOSPITAL_BASED_OUTPATIENT_CLINIC_OR_DEPARTMENT_OTHER): Payer: Medicare HMO

## 2014-08-02 ENCOUNTER — Telehealth: Payer: Self-pay | Admitting: Hematology

## 2014-08-02 ENCOUNTER — Ambulatory Visit (HOSPITAL_BASED_OUTPATIENT_CLINIC_OR_DEPARTMENT_OTHER): Payer: Medicare HMO | Admitting: Hematology

## 2014-08-02 ENCOUNTER — Ambulatory Visit (HOSPITAL_BASED_OUTPATIENT_CLINIC_OR_DEPARTMENT_OTHER): Payer: Medicare HMO

## 2014-08-02 VITALS — BP 158/62 | HR 53 | Temp 98.4°F | Resp 20 | Ht 68.0 in | Wt 185.8 lb

## 2014-08-02 DIAGNOSIS — C7951 Secondary malignant neoplasm of bone: Secondary | ICD-10-CM

## 2014-08-02 DIAGNOSIS — C7952 Secondary malignant neoplasm of bone marrow: Secondary | ICD-10-CM

## 2014-08-02 DIAGNOSIS — C61 Malignant neoplasm of prostate: Secondary | ICD-10-CM

## 2014-08-02 DIAGNOSIS — Z954 Presence of other heart-valve replacement: Secondary | ICD-10-CM

## 2014-08-02 DIAGNOSIS — F0390 Unspecified dementia without behavioral disturbance: Secondary | ICD-10-CM

## 2014-08-02 DIAGNOSIS — R740 Nonspecific elevation of levels of transaminase and lactic acid dehydrogenase [LDH]: Secondary | ICD-10-CM

## 2014-08-02 DIAGNOSIS — R7402 Elevation of levels of lactic acid dehydrogenase (LDH): Secondary | ICD-10-CM

## 2014-08-02 DIAGNOSIS — I5030 Unspecified diastolic (congestive) heart failure: Secondary | ICD-10-CM

## 2014-08-02 DIAGNOSIS — D509 Iron deficiency anemia, unspecified: Secondary | ICD-10-CM

## 2014-08-02 DIAGNOSIS — I509 Heart failure, unspecified: Secondary | ICD-10-CM

## 2014-08-02 DIAGNOSIS — I2510 Atherosclerotic heart disease of native coronary artery without angina pectoris: Secondary | ICD-10-CM

## 2014-08-02 LAB — CBC WITH DIFFERENTIAL/PLATELET
BASO%: 0.5 % (ref 0.0–2.0)
BASOS ABS: 0 10*3/uL (ref 0.0–0.1)
EOS ABS: 0.3 10*3/uL (ref 0.0–0.5)
EOS%: 4 % (ref 0.0–7.0)
HCT: 35.8 % — ABNORMAL LOW (ref 38.4–49.9)
HEMOGLOBIN: 11.2 g/dL — AB (ref 13.0–17.1)
LYMPH#: 1.7 10*3/uL (ref 0.9–3.3)
LYMPH%: 26.6 % (ref 14.0–49.0)
MCH: 29.1 pg (ref 27.2–33.4)
MCHC: 31.3 g/dL — ABNORMAL LOW (ref 32.0–36.0)
MCV: 93 fL (ref 79.3–98.0)
MONO#: 0.6 10*3/uL (ref 0.1–0.9)
MONO%: 9.1 % (ref 0.0–14.0)
NEUT%: 59.8 % (ref 39.0–75.0)
NEUTROS ABS: 3.7 10*3/uL (ref 1.5–6.5)
Platelets: 184 10*3/uL (ref 140–400)
RBC: 3.85 10*6/uL — ABNORMAL LOW (ref 4.20–5.82)
RDW: 15 % — AB (ref 11.0–14.6)
WBC: 6.2 10*3/uL (ref 4.0–10.3)

## 2014-08-02 LAB — COMPREHENSIVE METABOLIC PANEL (CC13)
ALBUMIN: 3 g/dL — AB (ref 3.5–5.0)
ALT: 13 U/L (ref 0–55)
AST: 19 U/L (ref 5–34)
Alkaline Phosphatase: 243 U/L — ABNORMAL HIGH (ref 40–150)
Anion Gap: 8 mEq/L (ref 3–11)
BUN: 22.5 mg/dL (ref 7.0–26.0)
CALCIUM: 8.6 mg/dL (ref 8.4–10.4)
CHLORIDE: 107 meq/L (ref 98–109)
CO2: 25 mEq/L (ref 22–29)
Creatinine: 1.1 mg/dL (ref 0.7–1.3)
GLUCOSE: 142 mg/dL — AB (ref 70–140)
POTASSIUM: 4 meq/L (ref 3.5–5.1)
Sodium: 140 mEq/L (ref 136–145)
Total Bilirubin: 0.35 mg/dL (ref 0.20–1.20)
Total Protein: 6.9 g/dL (ref 6.4–8.3)

## 2014-08-02 LAB — LACTATE DEHYDROGENASE (CC13): LDH: 274 U/L — ABNORMAL HIGH (ref 125–245)

## 2014-08-02 MED ORDER — DENOSUMAB 120 MG/1.7ML ~~LOC~~ SOLN
120.0000 mg | Freq: Once | SUBCUTANEOUS | Status: AC
  Administered 2014-08-02: 120 mg via SUBCUTANEOUS
  Filled 2014-08-02: qty 1.7

## 2014-08-02 NOTE — Telephone Encounter (Signed)
gv adn printed appt sched and avs for pt for OCT °

## 2014-08-02 NOTE — Progress Notes (Signed)
Consent form for X-geva signed by son.  Explained about taking Calcium supplements.  Information packet given to son.

## 2014-08-03 LAB — TESTOSTERONE: TESTOSTERONE: 25 ng/dL — AB (ref 300–890)

## 2014-08-03 LAB — PSA: PSA: 5.67 ng/mL — AB (ref <=4.00)

## 2014-08-05 ENCOUNTER — Encounter: Payer: Self-pay | Admitting: Hematology

## 2014-08-05 NOTE — Progress Notes (Signed)
[facility] PROGRESS NOTE DATE OF VISIT: 08/02/2014  [name], MD [place] [id number]  DIAGNOSIS: PROSTATE CANCER WITH BONE METASTASIS.  Chief Complaint  Patient presents with  . Follow-up    CURRENT TREATMENT:  Lupron 22.5 mg q 3 months,started on 06/05/2014.  Next one on 09/05/2014. Completed one month of casodex 50 mg daily on 06/29/2014.   INTERVAL HISTORY:  BABAK LUCUS 80 y.o. male 80 y.o. male who was referred for further evaluation of his suspected metastatic prostate cancer and seen initially on 06/05/2014. Today, he is accompanied by his son Elenore Rota.   As previously reported, Mr. Tomes had a fall on the 27th of June. He reports getting his feet tangled up in bathroom and had mechanical fall. Shortly following his fall, he reported right hip pain prompting a follow up visit to ER on th 28th. He had CXR, right femur x-Marquee and x-rays of his pelvis. The CXR demonstrated no acute disease. Right femur x-Carsin revealed an avulsed fracture of the right lesser trochanter which may be an intertrochanteric fracture. Attention on pelvic radiograph was recommended. He had a subsequent Ct of of his right hip confirming a displaced avulsion fracture of the RIGHT lesser trochanter. There was a crescentic sclerosis around the location of the avulsed fragment suggested an underlying osseous lesion. This crescentic sclerosis was new compared to the prior CT and was compatible with an underlying osseous metastatic lesion. Followup whole-body bone scan was recommended to assess for other foci of metastatic disease.. Bone survey was negative for lytic or sclerotic metastatic disease. He also had nuclear medicine bone scan revealing the following: 1. Uptake at the site of the fracture. 2. Uptake within the anterior inferior iliac spine on the right is concerning for metastatic disease. 3. The uptake within the right scapula may represent metastatic disease. 4. Focal  uptake at T10 on the right was not present on the prior MRI. This may represent new disease. His PSA was collected on 05/06/2014 was elevated to 70.23.   He had surgery with open reduction internal fixation right hip due to impending pathologic fracture of the right hip on 05/10/2014. This was done by Dr. Jean Rosenthal. He was discharged to subacute rehab at [facility] on 05/14/2014. He was readmitted on 05/21/2014 to the hospitalist service (Dr. Dia Crawford) with a right adductor magnus hematoma and low hemoglobin (around 5.8) due to supratherapeutic INR of 10.4. CT of abdomen and pelvis/hip at [facility] was negative for intraabdominal metastatic disease. He was maintained on coumadin due to an artificial heart valve. He was given vitamin K x 2, FFP x 1 and 3 units of packed RBCs. He was discharged back to the [facility] rehab on 05/25/2014. Son reports that a couple years ago, he had hematuria. He was told that he had elevated PSA in the past but they cannot recollect how high. He has never had a colonoscopy. He is now completely blind due to advanced macular degeneration.   Today, he reports no recent hospitalizations or emergency room visits.  He continues rehab. He is having some hot flashes and vasomotor symptoms but not bad  He denies any bone pain or hot flashes. I am going to start him on Xgeva shots today for his metastatic disease in bones to reduce pain, reduce the risk for pathological fractures and delay bone metastasis at new sites.  MEDICAL HISTORY: Past Medical History  Diagnosis Date  . CAD (coronary artery disease)  s/p CABG 1999  . HLD (hyperlipidemia)   . HTN (hypertension)   . GERD (gastroesophageal reflux disease)   . H/O hiatal hernia   . Arthritis   . Peripheral vascular disease     aneursym  . Aortic aneurysm     at least 3 cmm infrarenal AAA by lumbar CT 05/22/13 [facility] 41 mm proximal ascending aorta on echo 05/05/13 [facility])    INTERIM  HISTORY: has HYPERLIPIDEMIA; HYPERTENSION, UNSPECIFIED; Coronary atherosclerosis of native coronary artery; s/p [employer] AVR; Aortic valve disorders; Right inguinal hernia; Preoperative clearance; Fall; Weakness; B12 deficiency; Impaired vision; Chronic anticoagulation; Closed fracture of lesser trochanter of femur; Fall at home; Femoral fracture; Acute on chronic renal failure; Dementia without behavioral disturbance; Acute blood loss anemia; Metastatic cancer to bone; Chronic diastolic heart failure; Acute on chronic diastolic heart failure; Anemia; and Prostate cancer metastatic to bone on his problem list.    ALLERGIES:  is allergic to tetanus toxoids.  MEDICATIONS: has a current medication list which includes the following prescription(s): aspirin ec, cyanocobalamin, docusate sodium, donepezil, dorzolamide-timolol, hydrocodone-acetaminophen, latanoprost, lisinopril, meclizine, metoprolol, metoprolol, mirtazapine, multivitamin-lutein, omeprazole, polyethylene glycol, tamsulosin, and warfarin.  SURGICAL HISTORY:  Past Surgical History  Procedure Laterality Date  . Aortic valve replacement  1998  . Coronary artery bypass graft  1998  . Cholecystectomy    . Tonsillectomy    . US extremity*l* Left     arm  . Inguinal hernia repair  09/25/2013    with mesh  . Inguinal hernia repair Right 09/25/2013    Procedure: HERNIA REPAIR INGUINAL ADULT;  Surgeon: Imogene Burn. Georgette Dover, MD;  Location: [facility];  Service: General;  Laterality: Right;  . Insertion of mesh Right 09/25/2013    Procedure: INSERTION OF MESH;  Surgeon: Imogene Burn. Georgette Dover, MD;  Location: [facility];  Service: General;  Laterality: Right;  . Hand surgery Left     MULTIPLE   . Intramedullary (im) nail intertrochanteric Right 05/10/2014    Procedure: Open Reduction Internal Fixation Right Hip;  Surgeon: Mcarthur Rossetti, MD;  Location: [place];  Service: Orthopedics;  Laterality: Right;    REVIEW OF SYSTEMS:   Constitutional: Denies fevers,  chills or abnormal weight loss Eyes: Denies blurriness of vision, legally blind due to macular degeneration Ears, nose, mouth, throat, and face: Denies mucositis or sore throat, hard of hearing+. Respiratory: Denies cough, dyspnea or wheezes Cardiovascular: Denies palpitation, chest discomfort or lower extremity swelling Gastrointestinal:  Denies nausea, heartburn or change in bowel habits Skin: Denies abnormal skin rashes Lymphatics: Denies new lymphadenopathy or easy bruising Neurological:Denies numbness, tingling or new weaknesses Behavioral/Psych: Mood is stable, no new changes  All other systems were reviewed with the patient and are negative.  PHYSICAL EXAMINATION: ECOG PERFORMANCE STATUS: 2-3  Blood pressure 158/62, pulse 53, temperature 98.4 F (36.9 C), temperature source Oral, resp. rate 20, height _0  (1.727 m), weight 185 lb 12.8 oz (84.278 kg).  GENERAL:alert, no distress and comfortable; chronically ill appearing elderly male sitting in his wheelchair. Blind bilaterally and HOH.  SKIN: skin color, texture, turgor are normal, no rashes or significant lesions  EYES: normal, Conjunctiva are pink and non-injected, sclera clear  OROPHARYNX:no exudate, no erythema and lips, buccal mucosa, and tongue normal  NECK: supple, thyroid normal size, non-tender, without nodularity  LYMPH: no palpable lymphadenopathy in the cervical, axillary or inguinal  LUNGS: clear to auscultation and percussion with normal breathing effort  HEART: regular rate & rhythm and no murmurs and no lower  extremity edema  ABDOMEN:abdomen soft, non-tender and normal bowel sounds  Musculoskeletal:no cyanosis of digits and no clubbing  NEURO: alert & oriented x 3 with fluent speech, no focal motor/sensory deficits  Labs:  Lab Results  Component Value Date   WBC 6.2 08/02/2014   HGB 11.2* 08/02/2014   HCT 35.8* 08/02/2014   MCV 93.0 08/02/2014   PLT 184 08/02/2014   NEUTROABS 3.7 08/02/2014      Chemistry       Component Value Date/Time   NA 140 08/02/2014 1355   NA 140 05/24/2014 0357   K 4.0 08/02/2014 1355   K 4.7 05/24/2014 0357   CL 101 05/24/2014 0357   CO2 25 08/02/2014 1355   CO2 28 05/24/2014 0357   BUN 22.5 08/02/2014 1355   BUN 19 05/24/2014 0357   CREATININE 1.1 08/02/2014 1355   CREATININE 1.08 05/24/2014 0357      Component Value Date/Time   CALCIUM 8.6 08/02/2014 1355   CALCIUM 8.1* 05/24/2014 0357   ALKPHOS 243* 08/02/2014 1355   ALKPHOS 176* 05/24/2014 0357   AST 19 08/02/2014 1355   AST 87* 05/24/2014 0357   ALT 13 08/02/2014 1355   ALT 62* 05/24/2014 0357   BILITOT 0.35 08/02/2014 1355   BILITOT 1.8* 05/24/2014 0357      PSA 5.67 08/02/2014 PSA 10.66 1 MONTH AGO PSA 60.63 2 MONTHS AGO PSA 70 3 MONTHS AGO   RADIOGRAPHIC STUDIES:  Dg Chest 1 View  EXAM: PORTABLE CHEST - 1 VIEW COMPARISON: May 21, 2014. FINDINGS: Stable cardiomediastinal silhouette. Status post coronary artery bypass graft. No pneumothorax or pleural effusion is noted. Mild central pulmonary vascular congestion is noted. Narrowing of right subacromial space is noted concerning for rotator cuff injury. IMPRESSION:  Mild central pulmonary vascular congestion is noted. No other acute abnormality seen  05/06/2014   CLINICAL DATA:  Fall, right hip pain  EXAM: CHEST - 1 VIEW  COMPARISON:  10/17/2013  FINDINGS: Cardiomediastinal silhouette is stable. Status post CABG. No acute infiltrate or pleural effusion. No pulmonary edema.  IMPRESSION: No active disease.  Status post CABG.   Electronically Signed   By: Lahoma Crocker M.D.   On: 05/06/2014 13:35   Dg Pelvis 1-2 Views  05/06/2014   CLINICAL DATA:  Fall  EXAM: PELVIS - 1-2 VIEW  COMPARISON:  None.  FINDINGS: There is a displaced fracture of the lesser trochanter. There is also lucency in the intertrochanteric region medially. There is slight varus deformity of the proximal femur. Impaction intertrochanteric fracture is not excluded. Osteopenia.  IMPRESSION: Avulsed  fracture of the lesser trochanter.  Possible intertrochanteric femur fracture.  CT is recommended.   Electronically Signed   By: Maryclare Daniel M.D.   On: 05/06/2014 13:37   Dg Femur Right  05/11/2014   CLINICAL DATA:  Right hip ORIF  EXAM: DG C-ARM 61-120 MIN; RIGHT FEMUR - 2 VIEW  : COMPARISON:  MRI of the right hip from 2 days prior  FINDINGS: Status post intra medullary nail fixation of the femur with dynamic hip screw. No evidence of periprosthetic fracture or other complication.  IMPRESSION: Right femur internal fixation.   Electronically Signed   By: Jorje Guild M.D.   On: 05/11/2014 04:04   Dg Femur Right  05/06/2014   CLINICAL DATA:  Fall  EXAM: RIGHT FEMUR - 2 VIEW  COMPARISON:  None.  FINDINGS: The lesser trochanter is avulsed and displaced superiorly. There are overlapping structures at the intertrochanteric region. There is also  slight varus deformity of the proximal femur. Underlying intertrochanteric fracture is not excluded.  IMPRESSION: Avulsed fracture of the right lesser trochanter. There may be an intertrochanteric fracture. Attention on pelvic radiograph is recommended.   Electronically Signed   By: Maryclare Debarr M.D.   On: 05/06/2014 13:35   Ct Head Wo Contrast  05/22/2014   CLINICAL DATA:  Confusion  EXAM: CT HEAD WITHOUT CONTRAST  TECHNIQUE: Contiguous axial images were obtained from the base of the skull through the vertex without intravenous contrast.  COMPARISON:  Prior CT from 05/06/2014  FINDINGS: Atrophy with chronic microvascular ischemic changes are stable from prior.  There is no acute intracranial hemorrhage or infarct. No mass lesion or midline shift. Gray-white matter differentiation is well maintained. Ventricles are normal in size without evidence of hydrocephalus. CSF containing spaces are within normal limits. No extra-axial fluid collection.  The calvarium is intact.  Orbital soft tissues are within normal limits.  The paranasal sinuses and mastoid air cells are well  pneumatized and free of fluid.  Scalp soft tissues are unremarkable.  IMPRESSION: 1. No acute intracranial process. 2. Stable atrophy with moderate chronic microvascular ischemic disease.   Electronically Signed   By: Jeannine Boga M.D.   On: 05/22/2014 03:31   Ct Head Wo Contrast  05/06/2014   CLINICAL DATA:  Fall  EXAM: CT HEAD WITHOUT CONTRAST  TECHNIQUE: Contiguous axial images were obtained from the base of the skull through the vertex without intravenous contrast.  COMPARISON:  None.  FINDINGS: Global atrophy. Chronic ischemic changes in the periventricular white matter. Cranium is intact. Mastoid air cells clear. Visualized paranasal sinuses are clear.  IMPRESSION: No acute intracranial pathology.   Electronically Signed   By: Maryclare Mottern M.D.   On: 05/06/2014 13:04   Nm Bone Scan Whole Body  05/07/2014   CLINICAL DATA:  Abnormal CT scan. Question metastatic disease. Pathologic fracture.  EXAM: NUCLEAR MEDICINE WHOLE BODY BONE SCAN  TECHNIQUE: Whole body anterior and posterior images were obtained approximately 3 hours after intravenous injection of radiopharmaceutical.  RADIOPHARMACEUTICALS:  Twenty-five mCi Technetium-99 MDP  COMPARISON:  Bone survey 05/06/2014. CT of the right hip 05/06/2014. Thoracic and lumbar spine MRI 10/18/2013. Focal uptake within the scapula on the right is concerning for metastatic disease. This could be within posterior ribs. Minimal uptake is noted within the left lesser trochanter, potentially degenerative. Uptake within the medial aspect of the right knee is degenerative.  FINDINGS: Focal uptake is present along the lesser trochanter fracture and along the anterior inferior iliac spine. The former corresponds with fracture. The ladder with some degenerative change there may be a sclerotic lesion at the anterior inferior iliac spine as well. Focal uptake is present on the right at T10. There was no abnormality on the previous MRI.  IMPRESSION: 1. Uptake at the site of  the fracture. 2. Uptake within the anterior inferior iliac spine on the right is concerning for metastatic disease. This corresponds with a focal sclerotic lesion. 3. The uptake within the right scapula may represent metastatic disease. 4. Focal uptake at T10 on the right was not present on the prior MRI. This may represent new disease.   Electronically Signed   By: Lawrence Santiago M.D.   On: 05/07/2014 16:52   Ct Hip Right Wo Contrast  05/06/2014   CLINICAL DATA:  RIGHT hip pain. Fracture identified on prior radiographs.  EXAM: CT OF THE RIGHT HIP WITHOUT CONTRAST  TECHNIQUE: Multidetector CT imaging was performed according to  the standard protocol. Multiplanar CT image reconstructions were also generated.  COMPARISON:  Radiographs 05/06/2014. CT 05/01/2013 at [facility].  FINDINGS: There is an avulsion of the RIGHT lesser trochanter with anterior displacement of 4.3 cm. Proximal retraction is mild, measuring about 2 cm. On the coronal images, there is sclerosis surrounding the lesser trochanter, suggesting an underlying lesion, likely metastatic disease. No intertrochanteric extension of fracture is identified. Moderate RIGHT hip osteoarthritis is present with subchondral cyst in the femoral head and acetabular roof. Fatty atrophy of the gluteus minimus muscle is present. Small nonspecific sclerotic lesion present in the RIGHT iliac bone. Ossification of the L5-S1 disc with ankylosis. Benign bone island in the RIGHT iliac crest. Aortoiliac atherosclerosis.  IMPRESSION: Displaced avulsion fracture of the RIGHT lesser trochanter. Crescentic sclerosis around the location of the avulsed fragment suggests an underlying osseous lesion. This crescentic sclerosis is new compared to the prior CT and is compatible with an underlying osseous metastatic lesion. Followup whole-body bone scan recommended to assess for other foci of metastatic disease. In this older man, PSA should also be obtained.   Electronically  Signed   By: Dereck Ligas M.D.   On: 05/06/2014 15:39   Mr Hip Right Wo Contrast  05/08/2014   CLINICAL DATA:  Status post fall.  Right hip pain.  EXAM: MRI OF THE RIGHT HIP WITHOUT CONTRAST  TECHNIQUE: Multiplanar, multisequence MR imaging was performed. No intravenous contrast was administered.  COMPARISON:  None.  FINDINGS: HIP JOINTS  Both femoral heads appear normal without evidence of acute fracture, dislocation or avascular necrosis. There is no significant hip joint effusion. There is no gross labral or paralabral abnormality.  BONY PELVIS  There is an avulsion fracture of the right lesser trochanter superiorly displaced. There is adjacent soft tissue edema. There is a 2.3 x 5.3 cm osseous lesion within the proximal right femur at site of the lesser trochanteric avulsion.  There is a 2 cm superior right acetabular T1 hypointense lesion within adjacent 14 mm lesion. There is a 11.4 cm left intertrochanteric T1 hypo intense lesion. There is a small hypo intense lesion in the left posterior acetabulum.  MUSCLES AND TENDONS  Severe edema in the right iliopsoas muscle and tendon with proximal retraction consistent with lesser trochanteric avulsion fracture. Severe soft tissue edema in the extensor and adductor compartment. The gluteus and hamstring tendons appear normal. The piriformis muscles appear symmetric.  INTERNAL PELVIC FINDINGS  The visualized internal pelvic contents are unremarkable.  IMPRESSION: 1. Right lesser trochanteric avulsion fracture with an underlying osseous lesion in the right proximal femur concerning for malignancy. 2. Multiple hypointense lesions involving the right acetabulum, left acetabulum, right proximal femur and left proximal femur most concerning for metastatic disease.   Electronically Signed   By: Kathreen Devoid   On: 05/08/2014 09:05   Dg Bone Survey Met  05/07/2014   CLINICAL DATA:  Metastatic bone survey.  EXAM: METASTATIC BONE SURVEY  COMPARISON:  CT right hip  05/06/2014  FINDINGS: No lytic or sclerotic bone lesions are identified in the axial or appendicular skeleton.  There is an avulsion fracture again demonstrated at the lesser trochanter region of the right hip.  There is degenerative joint disease.  IMPRESSION: Negative metastatic bone survey for lytic or sclerotic metastatic disease.  Avulsion fracture at the lesser trochanter of the right hip.  Degenerative joint disease.   Electronically Signed   By: Kalman Jewels M.D.   On: 05/07/2014 07:33    ASSESSMENT: Clayton [name]  Clayton [name] 80 y.o. male with a history of Metastatic prostate cancer  PLAN:   1. Probable Metastatic Prostate cancer.  --Last visit, we reviewed his images in detail with findings consistent with Stage IV disease as evidence by multiple bone metastases. Pathology was negative for malignancy. In addition, he had an elevated PSA of 60.63 on 06/07/2014. Testesterone level was 168 on 06/07/2014 prior to receipt of lupron. Given these findings, we explained that he likely has metastatic prostate cancer. Given his advanced age and poor functional status along with multiple co-morbidities, family declined referral to urology for consideration of tissue biopsy which would provide definitive tissue diagnosis.    --Previously, we also reviewed the CT of abdomen and pelvis report obtained from [facility] during his recent admission. It was negative for visceral disease. We discussed treatment options for stage IV prostate cancer includes ADT with Lupron plus casodex versus chemotherapy. He is not a chemotherapy candidate due to his functional status and co-morbidities. We started on ADT on 06/05/2014 We discussed the indications would be palliative therapy, the benefits included improved quality of life and decreased pain, and risks include but are not limited to hot flashes, weight changes, decreased libido, testicular atrophy, tumor flare (an initial rise in serum testosterone concentrations may cause including  bone pain, neuropathy, hematuria, or ureteral or bladder outlet obstruction during the first 2 weeks). We started casodex 50 mg daily to minimize this risk and he completed it on 06/29/2014.   He consented and agreed to proceed with ADT understanding the indications, benefits and risks. His PSA is coming down consistent with treatment response.  --His next Lupron injections would be on 08/30/14 and 11/22/2014 and q 3 months after that.  --His Xgeva schedule will be starting today and then 08/30/14, 09/27/14, 10/25/14,11/22/14 and monthly after that. There is a risk for hypocalcemia and small risk of ONJ associated with Xgeva and that was explained to patient but overall benefits outweigh the risk esp with metastatic disease in bones and risk of skeletal complications.  --We will plan on following his PSA every 4 weeks.  I am adding Denosumab Delton See) for bone support given evidence of his bone lesions and that is given every 4 weeks.  If progression or hormone-refractory, Zytiga, Xtandi or Radium 223 will also be considered.   2. Pathologic R. Hip fracture(avulsion, less trochanteric) s/p R ORIF (05/10/2014)  --Completed physical therapy at [facility].   3. S/p complications with Hematoma, supratherapautic INR.  -- He has now resumed his anticoagulation.   4. AVR s/p mechanical valve replacement.  --Anticoagulation resumed per his son.   5. CAD/ CABG  --He is on metoprolol 25 mg bid and aspirin.   6. Blindness secondary to Macular Degeneration.   7. ECOG 3.  8. Dementia  -- On Aricept.   9. Diastolic CHF/HTN/HLD   10.  History of Elevated transaminases NOS  --Unclear etiology. Possible hepatic congestion. We will continue to trend.   11. Normocytic anemia  --Likely anemia of chronic disease with concomitant anemia of blood loss secondary to #3. We will continue to trend. Less likely related to his mild chronic kidney disease, CrCl is greater than 43.5 mL/min.   12. Follow up.   --PSA monthly and Lupron 22.5 mg q 3 months. Will check a testosterone level in 3 months.MD visit in 4 weeks.  All questions were answered. The patient knows to call the clinic with any problems, questions or concerns. We can certainly see the patient much sooner if necessary.  I spent 25 minutes counseling the patient face to face. The total time spent in the appointment was 30 minutes.    Bernadene Bell, MD Medical Hematologist/Oncologist [place] Pager: [phone] Office No: [phone]

## 2014-08-30 ENCOUNTER — Other Ambulatory Visit (HOSPITAL_BASED_OUTPATIENT_CLINIC_OR_DEPARTMENT_OTHER): Payer: Medicare HMO

## 2014-08-30 ENCOUNTER — Ambulatory Visit (HOSPITAL_BASED_OUTPATIENT_CLINIC_OR_DEPARTMENT_OTHER): Payer: Medicare HMO

## 2014-08-30 ENCOUNTER — Ambulatory Visit (HOSPITAL_BASED_OUTPATIENT_CLINIC_OR_DEPARTMENT_OTHER): Payer: Medicare HMO | Admitting: Hematology

## 2014-08-30 ENCOUNTER — Encounter: Payer: Self-pay | Admitting: Hematology

## 2014-08-30 ENCOUNTER — Telehealth: Payer: Self-pay | Admitting: Hematology

## 2014-08-30 VITALS — BP 149/71 | HR 51 | Temp 97.5°F

## 2014-08-30 VITALS — BP 167/65 | HR 51 | Temp 98.1°F | Resp 18 | Ht 68.0 in | Wt 191.1 lb

## 2014-08-30 DIAGNOSIS — C7951 Secondary malignant neoplasm of bone: Secondary | ICD-10-CM

## 2014-08-30 DIAGNOSIS — Z5111 Encounter for antineoplastic chemotherapy: Secondary | ICD-10-CM

## 2014-08-30 DIAGNOSIS — C61 Malignant neoplasm of prostate: Secondary | ICD-10-CM

## 2014-08-30 DIAGNOSIS — I5030 Unspecified diastolic (congestive) heart failure: Secondary | ICD-10-CM

## 2014-08-30 DIAGNOSIS — I10 Essential (primary) hypertension: Secondary | ICD-10-CM

## 2014-08-30 DIAGNOSIS — D649 Anemia, unspecified: Secondary | ICD-10-CM

## 2014-08-30 DIAGNOSIS — F0390 Unspecified dementia without behavioral disturbance: Secondary | ICD-10-CM

## 2014-08-30 DIAGNOSIS — R740 Nonspecific elevation of levels of transaminase and lactic acid dehydrogenase [LDH]: Secondary | ICD-10-CM

## 2014-08-30 DIAGNOSIS — I2510 Atherosclerotic heart disease of native coronary artery without angina pectoris: Secondary | ICD-10-CM

## 2014-08-30 LAB — CBC WITH DIFFERENTIAL/PLATELET
BASO%: 0.5 % (ref 0.0–2.0)
Basophils Absolute: 0 10*3/uL (ref 0.0–0.1)
EOS ABS: 0.2 10*3/uL (ref 0.0–0.5)
EOS%: 2.8 % (ref 0.0–7.0)
HEMATOCRIT: 37.3 % — AB (ref 38.4–49.9)
HGB: 12 g/dL — ABNORMAL LOW (ref 13.0–17.1)
LYMPH%: 21.5 % (ref 14.0–49.0)
MCH: 29.2 pg (ref 27.2–33.4)
MCHC: 32.3 g/dL (ref 32.0–36.0)
MCV: 90.6 fL (ref 79.3–98.0)
MONO#: 0.8 10*3/uL (ref 0.1–0.9)
MONO%: 8.8 % (ref 0.0–14.0)
NEUT#: 5.9 10*3/uL (ref 1.5–6.5)
NEUT%: 66.4 % (ref 39.0–75.0)
PLATELETS: 218 10*3/uL (ref 140–400)
RBC: 4.12 10*6/uL — ABNORMAL LOW (ref 4.20–5.82)
RDW: 15.4 % — ABNORMAL HIGH (ref 11.0–14.6)
WBC: 8.9 10*3/uL (ref 4.0–10.3)
lymph#: 1.9 10*3/uL (ref 0.9–3.3)

## 2014-08-30 LAB — COMPREHENSIVE METABOLIC PANEL (CC13)
ALT: 25 U/L (ref 0–55)
AST: 24 U/L (ref 5–34)
Albumin: 3.2 g/dL — ABNORMAL LOW (ref 3.5–5.0)
Alkaline Phosphatase: 164 U/L — ABNORMAL HIGH (ref 40–150)
Anion Gap: 6 mEq/L (ref 3–11)
BUN: 22.4 mg/dL (ref 7.0–26.0)
CO2: 27 mEq/L (ref 22–29)
Calcium: 8.6 mg/dL (ref 8.4–10.4)
Chloride: 108 mEq/L (ref 98–109)
Creatinine: 1.3 mg/dL (ref 0.7–1.3)
Glucose: 103 mg/dl (ref 70–140)
Potassium: 4.5 mEq/L (ref 3.5–5.1)
Sodium: 141 mEq/L (ref 136–145)
Total Bilirubin: 0.5 mg/dL (ref 0.20–1.20)
Total Protein: 6.9 g/dL (ref 6.4–8.3)

## 2014-08-30 MED ORDER — DENOSUMAB 120 MG/1.7ML ~~LOC~~ SOLN
120.0000 mg | Freq: Once | SUBCUTANEOUS | Status: AC
  Administered 2014-08-30: 120 mg via SUBCUTANEOUS
  Filled 2014-08-30: qty 1.7

## 2014-08-30 MED ORDER — LEUPROLIDE ACETATE (3 MONTH) 22.5 MG IM KIT
22.5000 mg | PACK | Freq: Once | INTRAMUSCULAR | Status: AC
  Administered 2014-08-30: 22.5 mg via INTRAMUSCULAR
  Filled 2014-08-30: qty 22.5

## 2014-08-30 NOTE — Patient Instructions (Signed)
Denosumab injection What is this medicine? DENOSUMAB (den oh sue mab) slows bone breakdown. Prolia is used to treat osteoporosis in women after menopause and in men. Xgeva is used to prevent bone fractures and other bone problems caused by cancer bone metastases. Xgeva is also used to treat giant cell tumor of the bone. This medicine may be used for other purposes; ask your health care provider or pharmacist if you have questions. COMMON BRAND NAME(S): Prolia, XGEVA What should I tell my health care provider before I take this medicine? They need to know if you have any of these conditions: -dental disease -eczema -infection or history of infections -kidney disease or on dialysis -low blood calcium or vitamin D -malabsorption syndrome -scheduled to have surgery or tooth extraction -taking medicine that contains denosumab -thyroid or parathyroid disease -an unusual reaction to denosumab, other medicines, foods, dyes, or preservatives -pregnant or trying to get pregnant -breast-feeding How should I use this medicine? This medicine is for injection under the skin. It is given by a health care professional in a hospital or clinic setting. If you are getting Prolia, a special MedGuide will be given to you by the pharmacist with each prescription and refill. Be sure to read this information carefully each time. For Prolia, talk to your pediatrician regarding the use of this medicine in children. Special care may be needed. For Xgeva, talk to your pediatrician regarding the use of this medicine in children. While this drug may be prescribed for children as young as 13 years for selected conditions, precautions do apply. Overdosage: If you think you've taken too much of this medicine contact a poison control center or emergency room at once. Overdosage: If you think you have taken too much of this medicine contact a poison control center or emergency room at once. NOTE: This medicine is only for  you. Do not share this medicine with others. What if I miss a dose? It is important not to miss your dose. Call your doctor or health care professional if you are unable to keep an appointment. What may interact with this medicine? Do not take this medicine with any of the following medications: -other medicines containing denosumab This medicine may also interact with the following medications: -medicines that suppress the immune system -medicines that treat cancer -steroid medicines like prednisone or cortisone This list may not describe all possible interactions. Give your health care provider a list of all the medicines, herbs, non-prescription drugs, or dietary supplements you use. Also tell them if you smoke, drink alcohol, or use illegal drugs. Some items may interact with your medicine. What should I watch for while using this medicine? Visit your doctor or health care professional for regular checks on your progress. Your doctor or health care professional may order blood tests and other tests to see how you are doing. Call your doctor or health care professional if you get a cold or other infection while receiving this medicine. Do not treat yourself. This medicine may decrease your body's ability to fight infection. You should make sure you get enough calcium and vitamin D while you are taking this medicine, unless your doctor tells you not to. Discuss the foods you eat and the vitamins you take with your health care professional. See your dentist regularly. Brush and floss your teeth as directed. Before you have any dental work done, tell your dentist you are receiving this medicine. Do not become pregnant while taking this medicine or for 5 months after stopping   it. Women should inform their doctor if they wish to become pregnant or think they might be pregnant. There is a potential for serious side effects to an unborn child. Talk to your health care professional or pharmacist for more  information. What side effects may I notice from receiving this medicine? Side effects that you should report to your doctor or health care professional as soon as possible: -allergic reactions like skin rash, itching or hives, swelling of the face, lips, or tongue -breathing problems -chest pain -fast, irregular heartbeat -feeling faint or lightheaded, falls -fever, chills, or any other sign of infection -muscle spasms, tightening, or twitches -numbness or tingling -skin blisters or bumps, or is dry, peels, or red -slow healing or unexplained pain in the mouth or jaw -unusual bleeding or bruising Side effects that usually do not require medical attention (Report these to your doctor or health care professional if they continue or are bothersome.): -muscle pain -stomach upset, gas This list may not describe all possible side effects. Call your doctor for medical advice about side effects. You may report side effects to FDA at 1-800-FDA-1088. Where should I keep my medicine? This medicine is only given in a clinic, doctor's office, or other health care setting and will not be stored at home. NOTE: This sheet is a summary. It may not cover all possible information. If you have questions about this medicine, talk to your doctor, pharmacist, or health care provider.  2015, [employer]. (2012-04-25 12:37:47) Leuprolide depot injection or implant What is this medicine? LEUPROLIDE (loo PROE lide) is a man-made protein that acts like a natural hormone in the body. It decreases testosterone in men and decreases estrogen in women. In men, this medicine is used to treat advanced prostate cancer. In women, some forms of this medicine may be used to treat endometriosis, uterine fibroids, or other male hormone-related problems. This medicine may be used for other purposes; ask your health care provider or pharmacist if you have questions. COMMON BRAND NAME(S): Eligard, Lupron Depot, Lupron  Depot-Ped, Viadur What should I tell my health care provider before I take this medicine? They need to know if you have any of these conditions: -diabetes -heart disease or previous heart attack -high blood pressure -high cholesterol -osteoporosis -pain or difficulty passing urine -spinal cord metastasis -stroke -tobacco smoker -unusual vaginal bleeding (women) -an unusual or allergic reaction to leuprolide, benzyl alcohol, other medicines, foods, dyes, or preservatives -pregnant or trying to get pregnant -breast-feeding How should I use this medicine? This medicine is for injection into a muscle or for implant or injection under the skin. It is given by a health care professional in a hospital or clinic setting. The specific product will determine how it will be given to you. Make sure you understand which product you receive and how often you will receive it. Talk to your pediatrician regarding the use of this medicine in children. Special care may be needed. Overdosage: If you think you have taken too much of this medicine contact a poison control center or emergency room at once. NOTE: This medicine is only for you. Do not share this medicine with others. What if I miss a dose? It is important not to miss a dose. Call your doctor or health care professional if you are unable to keep an appointment. Depot injections: Depot injections are given either once-monthly, every 12 weeks, every 16 weeks, or every 24 weeks depending on the product you are prescribed. The product you are prescribed   will be based on if you are male or male, and your condition. Make sure you understand your product and dosing. Implant dosing: The implant is removed and replaced once a year. The implant is only used in males. What may interact with this medicine? Do not take this medicine with any of the following medications: -chasteberry This medicine may also interact with the following medications: -herbal or  dietary supplements, like black cohosh or DHEA -male hormones, like estrogens or progestins and birth control pills, patches, rings, or injections -male hormones, like testosterone This list may not describe all possible interactions. Give your health care provider a list of all the medicines, herbs, non-prescription drugs, or dietary supplements you use. Also tell them if you smoke, drink alcohol, or use illegal drugs. Some items may interact with your medicine. What should I watch for while using this medicine? Visit your doctor or health care professional for regular checks on your progress. During the first weeks of treatment, your symptoms may get worse, but then will improve as you continue your treatment. You may get hot flashes, increased bone pain, increased difficulty passing urine, or an aggravation of nerve symptoms. Discuss these effects with your doctor or health care professional, some of them may improve with continued use of this medicine. Male patients may experience a menstrual cycle or spotting during the first months of therapy with this medicine. If this continues, contact your doctor or health care professional. What side effects may I notice from receiving this medicine? Side effects that you should report to your doctor or health care professional as soon as possible: -allergic reactions like skin rash, itching or hives, swelling of the face, lips, or tongue -breathing problems -chest pain -depression or memory disorders -pain in your legs or groin -pain at site where injected or implanted -severe headache -swelling of the feet and legs -visual changes -vomiting Side effects that usually do not require medical attention (report to your doctor or health care professional if they continue or are bothersome): -breast swelling or tenderness -decrease in sex drive or performance -diarrhea -hot flashes -loss of appetite -muscle, joint, or bone pains -nausea -redness  or irritation at site where injected or implanted -skin problems or acne This list may not describe all possible side effects. Call your doctor for medical advice about side effects. You may report side effects to FDA at 1-800-FDA-1088. Where should I keep my medicine? This drug is given in a hospital or clinic and will not be stored at home. NOTE: This sheet is a summary. It may not cover all possible information. If you have questions about this medicine, talk to your doctor, pharmacist, or health care provider.  2015, [employer]. (2010-04-29 14:41:21)  

## 2014-08-30 NOTE — Progress Notes (Signed)
[facility] ONCOL[facility] OFFICE PROGRESS NOTE DATE OF VISIT: 08/30/2014  [name], MD [place] [address]  DIAGNOSIS: PROSTATE CANCER WITH BONE METASTASIS.  Chief Complaint  Patient presents with  . Follow-up    CURRENT TREATMENT:  Lupron 22.5 mg q 3 months,started on 06/05/2014 and today on 09/05/2014. Completed one month of casodex 50 mg daily on 06/29/2014.   INTERVAL HISTORY:  QUAMAINE WEBB 80 y.o. male 80 y.o. male who was referred for further evaluation of his suspected metastatic prostate cancer and seen initially on 06/05/2014. Today, he is accompanied by his wife. Last visit here was on 08/02/14.   As previously reported, Mr. Steinhoff had a fall on the 27th of June. He reports getting his feet tangled up in bathroom and had mechanical fall. Shortly following his fall, he reported right hip pain prompting a follow up visit to ER on th 28th. He had CXR, right femur x-Yandriel and x-rays of his pelvis. The CXR demonstrated no acute disease. Right femur x-Caine revealed an avulsed fracture of the right lesser trochanter which may be an intertrochanteric fracture. Attention on pelvic radiograph was recommended. He had a subsequent Ct of of his right hip confirming a displaced avulsion fracture of the RIGHT lesser trochanter. There was a crescentic sclerosis around the location of the avulsed fragment suggested an underlying osseous lesion. This crescentic sclerosis was new compared to the prior CT and was compatible with an underlying osseous metastatic lesion. Followup whole-body bone scan was recommended to assess for other foci of metastatic disease.. Bone survey was negative for lytic or sclerotic metastatic disease. He also had nuclear medicine bone scan revealing the following: 1. Uptake at the site of the fracture. 2. Uptake within the anterior inferior iliac spine on the right is concerning for metastatic disease. 3. The uptake within the right scapula may represent  metastatic disease. 4. Focal uptake at T10 on the right was not present on the prior MRI. This may represent new disease. His PSA was collected on 05/06/2014 was elevated to 70.23.   He had surgery with open reduction internal fixation right hip due to impending pathologic fracture of the right hip on 05/10/2014. This was done by Dr. Jean Rosenthal. He was discharged to subacute rehab at [facility] on 05/14/2014. He was readmitted on 05/21/2014 to the hospitalist service (Dr. Dia Crawford) with a right adductor magnus hematoma and low hemoglobin (around 5.8) due to supratherapeutic INR of 10.4. CT of abdomen and pelvis/hip at [facility] was negative for intraabdominal metastatic disease. He was maintained on coumadin due to an artificial heart valve. He was given vitamin K x 2, FFP x 1 and 3 units of packed RBCs. He was discharged back to the [facility] rehab on 05/25/2014. Son reports that a couple years ago, he had hematuria. He was told that he had elevated PSA in the past but they cannot recollect how high. He has never had a colonoscopy. He is now completely blind due to advanced macular degeneration.   Today, he reports no recent hospitalizations or emergency room visits.  He continues rehab. He is having some hot flashes and vasomotor symptoms but not bad  He denies any bone pain or hot flashes. I started monthly Xgeva shots in September 2015 for his metastatic disease in bones to reduce pain, reduce the risk for pathological fractures and delay bone metastasis at new sites. He has noticed some polyuria and nocturia symptoms, his urologist is Dr Risa Grill.  MEDICAL HISTORY: Past Medical History  Diagnosis Date  . CAD (coronary artery disease)     s/p CABG 1999  . HLD (hyperlipidemia)   . HTN (hypertension)   . GERD (gastroesophageal reflux disease)   . H/O hiatal hernia   . Arthritis   . Peripheral vascular disease     aneursym  . Aortic aneurysm     at least 3 cmm infrarenal AAA by  lumbar CT 05/22/13 [facility]); 41 mm proximal ascending aorta on echo 05/05/13 [facility])    INTERIM HISTORY: has HYPERLIPIDEMIA; HYPERTENSION, UNSPECIFIED; Coronary atherosclerosis of native coronary artery; s/p [employer] AVR; Aortic valve disorders; Right inguinal hernia; Preoperative clearance; Fall; Weakness; B12 deficiency; Impaired vision; Chronic anticoagulation; Closed fracture of lesser trochanter of femur; Fall at home; Femoral fracture; Acute on chronic renal failure; Dementia without behavioral disturbance; Acute blood loss anemia; Metastatic cancer to bone; Chronic diastolic heart failure; Acute on chronic diastolic heart failure; Anemia; and Prostate cancer metastatic to bone on his problem list.    ALLERGIES:  is allergic to tetanus toxoids.  MEDICATIONS: has a current medication list which includes the following prescription(s): aspirin ec, cyanocobalamin, donepezil, dorzolamide-timolol, latanoprost, lisinopril, meclizine, metoprolol, metoprolol, tamsulosin, warfarin, mirtazapine, multivitamin-lutein, and omeprazole.  SURGICAL HISTORY:  Past Surgical History  Procedure Laterality Date  . Aortic valve replacement  1998  . Coronary artery bypass graft  1998  . Cholecystectomy    . Tonsillectomy    . US extremity*l* Left     arm  . Inguinal hernia repair  09/25/2013    with mesh  . Inguinal hernia repair Right 09/25/2013    Procedure: HERNIA REPAIR INGUINAL ADULT;  Surgeon: Imogene Burn. Georgette Dover, MD;  Location: [place];  Service: General;  Laterality: Right;  . Insertion of mesh Right 09/25/2013    Procedure: INSERTION OF MESH;  Surgeon: Imogene Burn. Georgette Dover, MD;  Location: [facility];  Service: General;  Laterality: Right;  . Hand surgery Left     MULTIPLE   . Intramedullary (im) nail intertrochanteric Right 05/10/2014    Procedure: Open Reduction Internal Fixation Right Hip;  Surgeon: Mcarthur Rossetti, MD;  Location: [facility];  Service: Orthopedics;  Laterality: Right;     REVIEW OF SYSTEMS:   Constitutional: Denies fevers, chills or abnormal weight loss Eyes: Denies blurriness of vision, legally blind due to macular degeneration Ears, nose, mouth, throat, and face: Denies mucositis or sore throat, hard of hearing+. Respiratory: Denies cough, dyspnea or wheezes Cardiovascular: Denies palpitation, chest discomfort or lower extremity swelling Gastrointestinal:  Denies nausea, heartburn or change in bowel habits Skin: Denies abnormal skin rashes Lymphatics: Denies new lymphadenopathy or easy bruising Neurological:Denies numbness, tingling or new weaknesses Behavioral/Psych: Mood is stable, no new changes  All other systems were reviewed with the patient and are negative.  PHYSICAL EXAMINATION: ECOG PERFORMANCE STATUS: 2-3  Blood pressure 167/65, pulse 51, temperature 98.1 F (36.7 C), temperature source Oral, resp. rate 18, height _0  (1.727 m), weight 191 lb 1.6 oz (86.682 kg), SpO2 99.00%.  GENERAL:alert, no distress and comfortable; chronically ill appearing elderly male sitting in his wheelchair. Blind bilaterally and HOH.  SKIN: skin color, texture, turgor are normal, no rashes or significant lesions  EYES: normal, Conjunctiva are pink and non-injected, sclera clear  OROPHARYNX:no exudate, no erythema and lips, buccal mucosa, and tongue normal  NECK: supple, thyroid normal size, non-tender, without nodularity  LYMPH: no palpable lymphadenopathy in the cervical, axillary or inguinal  LUNGS: clear to auscultation and percussion with  normal breathing effort  HEART: regular rate & rhythm and no murmurs and no lower extremity edema  ABDOMEN:abdomen soft, non-tender and normal bowel sounds  Musculoskeletal:no cyanosis of digits and no clubbing  NEURO: alert & oriented x 3 with fluent speech, no focal motor/sensory deficits  Labs:  Lab Results  Component Value Date   WBC 8.9 08/30/2014   HGB 12.0* 08/30/2014   HCT 37.3* 08/30/2014   MCV 90.6  08/30/2014   PLT 218 08/30/2014   NEUTROABS 5.9 08/30/2014      Chemistry      Component Value Date/Time   NA 141 08/30/2014 0819   NA 140 05/24/2014 0357   K 4.5 08/30/2014 0819   K 4.7 05/24/2014 0357   CL 101 05/24/2014 0357   CO2 27 08/30/2014 0819   CO2 28 05/24/2014 0357   BUN 22.4 08/30/2014 0819   BUN 19 05/24/2014 0357   CREATININE 1.3 08/30/2014 0819   CREATININE 1.08 05/24/2014 0357      Component Value Date/Time   CALCIUM 8.6 08/30/2014 0819   CALCIUM 8.1* 05/24/2014 0357   ALKPHOS 164* 08/30/2014 0819   ALKPHOS 176* 05/24/2014 0357   AST 24 08/30/2014 0819   AST 87* 05/24/2014 0357   ALT 25 08/30/2014 0819   ALT 62* 05/24/2014 0357   BILITOT 0.50 08/30/2014 0819   BILITOT 1.8* 05/24/2014 0357     PSA pending from today  PSA 5.67 08/02/2014 PSA 10.66 1 MONTH AGO PSA 60.63 2 MONTHS AGO PSA 70 3 MONTHS AGO   RADIOGRAPHIC STUDIES:  Dg Chest 1 View  EXAM: PORTABLE CHEST - 1 VIEW COMPARISON: May 21, 2014. FINDINGS: Stable cardiomediastinal silhouette. Status post coronary artery bypass graft. No pneumothorax or pleural effusion is noted. Mild central pulmonary vascular congestion is noted. Narrowing of right subacromial space is noted concerning for rotator cuff injury. IMPRESSION:  Mild central pulmonary vascular congestion is noted. No other acute abnormality seen  05/06/2014   CLINICAL DATA:  Fall, right hip pain  EXAM: CHEST - 1 VIEW  COMPARISON:  10/17/2013  FINDINGS: Cardiomediastinal silhouette is stable. Status post CABG. No acute infiltrate or pleural effusion. No pulmonary edema.  IMPRESSION: No active disease.  Status post CABG.   Electronically Signed   By: Lahoma Crocker M.D.   On: 05/06/2014 13:35   Dg Pelvis 1-2 Views  05/06/2014   CLINICAL DATA:  Fall  EXAM: PELVIS - 1-2 VIEW  COMPARISON:  None.  FINDINGS: There is a displaced fracture of the lesser trochanter. There is also lucency in the intertrochanteric region medially. There is slight varus deformity of  the proximal femur. Impaction intertrochanteric fracture is not excluded. Osteopenia.  IMPRESSION: Avulsed fracture of the lesser trochanter.  Possible intertrochanteric femur fracture.  CT is recommended.   Electronically Signed   By: Maryclare Lagerquist M.D.   On: 05/06/2014 13:37   Dg Femur Right  05/11/2014   CLINICAL DATA:  Right hip ORIF  EXAM: DG C-ARM 61-120 MIN; RIGHT FEMUR - 2 VIEW  : COMPARISON:  MRI of the right hip from 2 days prior  FINDINGS: Status post intra medullary nail fixation of the femur with dynamic hip screw. No evidence of periprosthetic fracture or other complication.  IMPRESSION: Right femur internal fixation.   Electronically Signed   By: Jorje Guild M.D.   On: 05/11/2014 04:04   Dg Femur Right  05/06/2014   CLINICAL DATA:  Fall  EXAM: RIGHT FEMUR - 2 VIEW  COMPARISON:  None.  FINDINGS:  The lesser trochanter is avulsed and displaced superiorly. There are overlapping structures at the intertrochanteric region. There is also slight varus deformity of the proximal femur. Underlying intertrochanteric fracture is not excluded.  IMPRESSION: Avulsed fracture of the right lesser trochanter. There may be an intertrochanteric fracture. Attention on pelvic radiograph is recommended.   Electronically Signed   By: Maryclare Streight M.D.   On: 05/06/2014 13:35   Ct Head Wo Contrast  05/22/2014   CLINICAL DATA:  Confusion  EXAM: CT HEAD WITHOUT CONTRAST  TECHNIQUE: Contiguous axial images were obtained from the base of the skull through the vertex without intravenous contrast.  COMPARISON:  Prior CT from 05/06/2014  FINDINGS: Atrophy with chronic microvascular ischemic changes are stable from prior.  There is no acute intracranial hemorrhage or infarct. No mass lesion or midline shift. Gray-white matter differentiation is well maintained. Ventricles are normal in size without evidence of hydrocephalus. CSF containing spaces are within normal limits. No extra-axial fluid collection.  The calvarium is intact.   Orbital soft tissues are within normal limits.  The paranasal sinuses and mastoid air cells are well pneumatized and free of fluid.  Scalp soft tissues are unremarkable.  IMPRESSION: 1. No acute intracranial process. 2. Stable atrophy with moderate chronic microvascular ischemic disease.   Electronically Signed   By: Jeannine Boga M.D.   On: 05/22/2014 03:31   Ct Head Wo Contrast  05/06/2014   CLINICAL DATA:  Fall  EXAM: CT HEAD WITHOUT CONTRAST  TECHNIQUE: Contiguous axial images were obtained from the base of the skull through the vertex without intravenous contrast.  COMPARISON:  None.  FINDINGS: Global atrophy. Chronic ischemic changes in the periventricular white matter. Cranium is intact. Mastoid air cells clear. Visualized paranasal sinuses are clear.  IMPRESSION: No acute intracranial pathology.   Electronically Signed   By: Maryclare Rock M.D.   On: 05/06/2014 13:04   Nm Bone Scan Whole Body  05/07/2014   CLINICAL DATA:  Abnormal CT scan. Question metastatic disease. Pathologic fracture.  EXAM: NUCLEAR MEDICINE WHOLE BODY BONE SCAN  TECHNIQUE: Whole body anterior and posterior images were obtained approximately 3 hours after intravenous injection of radiopharmaceutical.  RADIOPHARMACEUTICALS:  Twenty-five mCi Technetium-99 MDP  COMPARISON:  Bone survey 05/06/2014. CT of the right hip 05/06/2014. Thoracic and lumbar spine MRI 10/18/2013. Focal uptake within the scapula on the right is concerning for metastatic disease. This could be within posterior ribs. Minimal uptake is noted within the left lesser trochanter, potentially degenerative. Uptake within the medial aspect of the right knee is degenerative.  FINDINGS: Focal uptake is present along the lesser trochanter fracture and along the anterior inferior iliac spine. The former corresponds with fracture. The ladder with some degenerative change there may be a sclerotic lesion at the anterior inferior iliac spine as well. Focal uptake is present on  the right at T10. There was no abnormality on the previous MRI.  IMPRESSION: 1. Uptake at the site of the fracture. 2. Uptake within the anterior inferior iliac spine on the right is concerning for metastatic disease. This corresponds with a focal sclerotic lesion. 3. The uptake within the right scapula may represent metastatic disease. 4. Focal uptake at T10 on the right was not present on the prior MRI. This may represent new disease.   Electronically Signed   By: Lawrence Santiago M.D.   On: 05/07/2014 16:52   Ct Hip Right Wo Contrast  05/06/2014   CLINICAL DATA:  RIGHT hip pain. Fracture identified on prior  radiographs.  EXAM: CT OF THE RIGHT HIP WITHOUT CONTRAST  TECHNIQUE: Multidetector CT imaging was performed according to the standard protocol. Multiplanar CT image reconstructions were also generated.  COMPARISON:  Radiographs 05/06/2014. CT 05/01/2013 at [facility].  FINDINGS: There is an avulsion of the RIGHT lesser trochanter with anterior displacement of 4.3 cm. Proximal retraction is mild, measuring about 2 cm. On the coronal images, there is sclerosis surrounding the lesser trochanter, suggesting an underlying lesion, likely metastatic disease. No intertrochanteric extension of fracture is identified. Moderate RIGHT hip osteoarthritis is present with subchondral cyst in the femoral head and acetabular roof. Fatty atrophy of the gluteus minimus muscle is present. Small nonspecific sclerotic lesion present in the RIGHT iliac bone. Ossification of the L5-S1 disc with ankylosis. Benign bone island in the RIGHT iliac crest. Aortoiliac atherosclerosis.  IMPRESSION: Displaced avulsion fracture of the RIGHT lesser trochanter. Crescentic sclerosis around the location of the avulsed fragment suggests an underlying osseous lesion. This crescentic sclerosis is new compared to the prior CT and is compatible with an underlying osseous metastatic lesion. Followup whole-body bone scan recommended to assess for  other foci of metastatic disease. In this older man, PSA should also be obtained.   Electronically Signed   By: Dereck Ligas M.D.   On: 05/06/2014 15:39   Mr Hip Right Wo Contrast  05/08/2014   CLINICAL DATA:  Status post fall.  Right hip pain.  EXAM: MRI OF THE RIGHT HIP WITHOUT CONTRAST  TECHNIQUE: Multiplanar, multisequence MR imaging was performed. No intravenous contrast was administered.  COMPARISON:  None.  FINDINGS: HIP JOINTS  Both femoral heads appear normal without evidence of acute fracture, dislocation or avascular necrosis. There is no significant hip joint effusion. There is no gross labral or paralabral abnormality.  BONY PELVIS  There is an avulsion fracture of the right lesser trochanter superiorly displaced. There is adjacent soft tissue edema. There is a 2.3 x 5.3 cm osseous lesion within the proximal right femur at site of the lesser trochanteric avulsion.  There is a 2 cm superior right acetabular T1 hypointense lesion within adjacent 14 mm lesion. There is a 11.4 cm left intertrochanteric T1 hypo intense lesion. There is a small hypo intense lesion in the left posterior acetabulum.  MUSCLES AND TENDONS  Severe edema in the right iliopsoas muscle and tendon with proximal retraction consistent with lesser trochanteric avulsion fracture. Severe soft tissue edema in the extensor and adductor compartment. The gluteus and hamstring tendons appear normal. The piriformis muscles appear symmetric.  INTERNAL PELVIC FINDINGS  The visualized internal pelvic contents are unremarkable.  IMPRESSION: 1. Right lesser trochanteric avulsion fracture with an underlying osseous lesion in the right proximal femur concerning for malignancy. 2. Multiple hypointense lesions involving the right acetabulum, left acetabulum, right proximal femur and left proximal femur most concerning for metastatic disease.   Electronically Signed   By: Kathreen Devoid   On: 05/08/2014 09:05   Dg Bone Survey Met  05/07/2014    CLINICAL DATA:  Metastatic bone survey.  EXAM: METASTATIC BONE SURVEY  COMPARISON:  CT right hip 05/06/2014  FINDINGS: No lytic or sclerotic bone lesions are identified in the axial or appendicular skeleton.  There is an avulsion fracture again demonstrated at the lesser trochanter region of the right hip.  There is degenerative joint disease.  IMPRESSION: Negative metastatic bone survey for lytic or sclerotic metastatic disease.  Avulsion fracture at the lesser trochanter of the right hip.  Degenerative joint disease.   Electronically  Signed   By: Kalman Jewels M.D.   On: 05/07/2014 07:33    ASSESSMENT: Clayton [name] 80 y.o. male with a history of Metastatic prostate cancer  PLAN:   1. Metastatic Prostate cancer.  --Last visit, we reviewed his images in detail with findings consistent with Stage IV disease as evidence by multiple bone metastases. Pathology was negative for malignancy. In addition, he had an elevated PSA of 60.63 on 06/07/2014. Testesterone level was 168 on 06/07/2014 prior to receipt of lupron. Given these findings, we explained that he likely has metastatic prostate cancer. Given his advanced age and poor functional status along with multiple co-morbidities, family declined referral to urology for consideration of tissue biopsy which would provide definitive tissue diagnosis.    --Previously, we also reviewed the CT of abdomen and pelvis report obtained from [facility] during his recent admission. It was negative for visceral disease. We discussed treatment options for stage IV prostate cancer includes ADT with Lupron plus casodex versus chemotherapy. He is not a chemotherapy candidate due to his functional status and co-morbidities. We started on ADT on 06/05/2014 We discussed the indications would be palliative therapy, the benefits included improved quality of life and decreased pain, and risks include but are not limited to hot flashes, weight changes, decreased libido, testicular  atrophy, tumor flare (an initial rise in serum testosterone concentrations may cause including bone pain, neuropathy, hematuria, or ureteral or bladder outlet obstruction during the first 2 weeks). We started casodex 50 mg daily to minimize this risk and he completed it on 06/29/2014.   He consented and agreed to proceed with ADT understanding the indications, benefits and risks. His PSA is coming down consistent with treatment response.  --His next Lupron injections would be on 08/30/14 and 11/22/2014 and q 3 months after that.  --His Xgeva schedule will be starting today and then 08/30/14, 09/27/14, 10/25/14,11/22/14 and monthly after that. There is a risk for hypocalcemia and small risk of ONJ associated with Xgeva and that was explained to patient but overall benefits outweigh the risk esp with metastatic disease in bones and risk of skeletal complications.  --We will plan on following his PSA every 4 weeks.  I am adding Denosumab Delton See) for bone support given evidence of his bone lesions and that is given every 4 weeks.  If progression or hormone-refractory, Zytiga, Xtandi or Radium 223 will also be considered.   2. Pathologic R. Hip fracture(avulsion, less trochanteric) s/p R ORIF (05/10/2014)  --[facility].   3. S/p complications with Hematoma, supratherapautic INR.  -- He has now resumed his anticoagulation.   4. AVR s/p mechanical valve replacement.  --Anticoagulation resumed per his son.   5. CAD/ CABG  --He is on metoprolol 25 mg bid and aspirin.   6. Blindness secondary to Macular Degeneration.   7. ECOG 3.  8. Dementia  -- On Aricept.   9. Diastolic CHF/HTN/HLD   10.  History of Elevated transaminases NOS  --Unclear etiology. Possible hepatic congestion. We will continue to trend.   11. Normocytic anemia  --Likely anemia of chronic disease with concomitant anemia of blood loss secondary to #3. We will continue to trend. Less likely  related to his mild chronic kidney disease, CrCl is greater than 43.5 mL/min.   12. Follow up.  --PSA monthly and Lupron 22.5 mg q 3 months. Will check a testosterone level in 3 months.MD visit in 4 weeks.  All questions were answered. The patient knows to call  the clinic with any problems, questions or concerns. We can certainly see the patient much sooner if necessary.  I spent 25 minutes counseling the patient face to face. The total time spent in the appointment was 30 minutes.    Bernadene Bell, MD Medical Hematologist/Oncologist [place] Pager: [phone] Office No: [phone]

## 2014-08-31 LAB — PSA: PSA: 4.61 ng/mL — AB (ref <=4.00)

## 2014-09-27 ENCOUNTER — Other Ambulatory Visit: Payer: Medicare HMO

## 2014-09-27 ENCOUNTER — Ambulatory Visit: Payer: Medicare HMO

## 2014-09-28 ENCOUNTER — Ambulatory Visit (HOSPITAL_BASED_OUTPATIENT_CLINIC_OR_DEPARTMENT_OTHER): Payer: Medicare HMO | Admitting: Nurse Practitioner

## 2014-09-28 ENCOUNTER — Other Ambulatory Visit: Payer: Self-pay | Admitting: Hematology

## 2014-09-28 ENCOUNTER — Other Ambulatory Visit (HOSPITAL_BASED_OUTPATIENT_CLINIC_OR_DEPARTMENT_OTHER): Payer: Medicare HMO

## 2014-09-28 ENCOUNTER — Encounter: Payer: Self-pay | Admitting: Nurse Practitioner

## 2014-09-28 ENCOUNTER — Ambulatory Visit: Payer: Medicare HMO

## 2014-09-28 VITALS — BP 156/64 | HR 56 | Temp 98.2°F | Resp 18 | Ht 68.0 in | Wt 193.9 lb

## 2014-09-28 DIAGNOSIS — C7951 Secondary malignant neoplasm of bone: Secondary | ICD-10-CM

## 2014-09-28 DIAGNOSIS — G8929 Other chronic pain: Secondary | ICD-10-CM

## 2014-09-28 DIAGNOSIS — R232 Flushing: Secondary | ICD-10-CM

## 2014-09-28 DIAGNOSIS — C61 Malignant neoplasm of prostate: Secondary | ICD-10-CM

## 2014-09-28 DIAGNOSIS — M25569 Pain in unspecified knee: Secondary | ICD-10-CM

## 2014-09-28 DIAGNOSIS — M25561 Pain in right knee: Secondary | ICD-10-CM

## 2014-09-28 LAB — CBC WITH DIFFERENTIAL/PLATELET
BASO%: 0.1 % (ref 0.0–2.0)
BASOS ABS: 0 10*3/uL (ref 0.0–0.1)
EOS ABS: 0.2 10*3/uL (ref 0.0–0.5)
EOS%: 3.1 % (ref 0.0–7.0)
HCT: 39.3 % (ref 38.4–49.9)
HEMOGLOBIN: 12.5 g/dL — AB (ref 13.0–17.1)
LYMPH#: 1.6 10*3/uL (ref 0.9–3.3)
LYMPH%: 22.7 % (ref 14.0–49.0)
MCH: 29.4 pg (ref 27.2–33.4)
MCHC: 31.8 g/dL — ABNORMAL LOW (ref 32.0–36.0)
MCV: 92.5 fL (ref 79.3–98.0)
MONO#: 0.7 10*3/uL (ref 0.1–0.9)
MONO%: 9.5 % (ref 0.0–14.0)
NEUT%: 64.6 % (ref 39.0–75.0)
NEUTROS ABS: 4.6 10*3/uL (ref 1.5–6.5)
Platelets: 172 10*3/uL (ref 140–400)
RBC: 4.25 10*6/uL (ref 4.20–5.82)
RDW: 14.8 % — AB (ref 11.0–14.6)
WBC: 7.1 10*3/uL (ref 4.0–10.3)

## 2014-09-28 LAB — COMPREHENSIVE METABOLIC PANEL (CC13)
ALBUMIN: 3.4 g/dL — AB (ref 3.5–5.0)
ALT: 18 U/L (ref 0–55)
ANION GAP: 6 meq/L (ref 3–11)
AST: 22 U/L (ref 5–34)
Alkaline Phosphatase: 148 U/L (ref 40–150)
BUN: 16.5 mg/dL (ref 7.0–26.0)
CALCIUM: 8.7 mg/dL (ref 8.4–10.4)
CHLORIDE: 108 meq/L (ref 98–109)
CO2: 28 meq/L (ref 22–29)
CREATININE: 1.1 mg/dL (ref 0.7–1.3)
GLUCOSE: 101 mg/dL (ref 70–140)
POTASSIUM: 4.4 meq/L (ref 3.5–5.1)
Sodium: 141 mEq/L (ref 136–145)
Total Bilirubin: 0.51 mg/dL (ref 0.20–1.20)
Total Protein: 7 g/dL (ref 6.4–8.3)

## 2014-09-28 MED ORDER — DENOSUMAB 120 MG/1.7ML ~~LOC~~ SOLN
120.0000 mg | Freq: Once | SUBCUTANEOUS | Status: AC
  Administered 2014-09-28: 120 mg via SUBCUTANEOUS
  Filled 2014-09-28: qty 1.7

## 2014-09-28 NOTE — Progress Notes (Signed)
will   SYMPTOM MANAGEMENT CLINIC   HPI: Clayton [name] 80 y.o. male diagnosed with prostate cancer.  Currently undergoing Lupron and Xgeva therapy.  Patient presents to the [facility] today for followup of his prostate cancer.  He last received his Lupron injection on 08/30/2014.  He is scheduled to receive his Xgeva injection today for previously diagnosed bone metastasis.  Patient states he's been doing fairly well recently.  He states his energy level and his appetite remains stable.  He is complaining of having multiple hot flashes throughout the day; but feels that they are tolerable at present.  Patient states that he continues with chronic right knee pain; and feels that this is arthritic in nature.  His chronic knee pain does prevent him from ambulating is much as he would like.  He is able to live with the assistance of a walker so: But prefers to stay in his wheelchair.  Patient is blind as baseline per glaucoma history.  Patient also wears a glove to his left hand from a childhood surgical event.  Patient denies any new fevers or chills.   HPI  ROS  Past Medical History  Diagnosis Date  . CAD (coronary artery disease)     s/p CABG 1999  . HLD (hyperlipidemia)   . HTN (hypertension)   . GERD (gastroesophageal reflux disease)   . H/O hiatal hernia   . Arthritis   . Peripheral vascular disease     aneursym  . Aortic aneurysm     at least 3 cmm infrarenal AAA by lumbar CT 05/22/13 [facility]); 41 mm proximal ascending aorta on echo 05/05/13 [facility])    Past Surgical History  Procedure Laterality Date  . Aortic valve replacement  1998  . Coronary artery bypass graft  1998  . Cholecystectomy    . Tonsillectomy    . US extremity*l* Left     arm  . Inguinal hernia repair  09/25/2013    with mesh  . Inguinal hernia repair Right 09/25/2013    Procedure: HERNIA REPAIR INGUINAL ADULT;  Surgeon: Imogene Burn. Georgette Dover, MD;  Location: [place];  Service: General;   Laterality: Right;  . Insertion of mesh Right 09/25/2013    Procedure: INSERTION OF MESH;  Surgeon: Imogene Burn. Georgette Dover, MD;  Location: [place];  Service: General;  Laterality: Right;  . Hand surgery Left     MULTIPLE   . Intramedullary (im) nail intertrochanteric Right 05/10/2014    Procedure: Open Reduction Internal Fixation Right Hip;  Surgeon: Mcarthur Rossetti, MD;  Location: [place];  Service: Orthopedics;  Laterality: Right;    has HYPERLIPIDEMIA; HYPERTENSION, UNSPECIFIED; Coronary atherosclerosis of native coronary artery; s/p [employer] AVR; Aortic valve disorders; Right inguinal hernia; Preoperative clearance; Fall; Weakness; B12 deficiency; Impaired vision; Chronic anticoagulation; Closed fracture of lesser trochanter of femur; Fall at home; Femoral fracture; Acute on chronic renal failure; Dementia without behavioral disturbance; Acute blood loss anemia; Metastatic cancer to bone; Chronic diastolic heart failure; Acute on chronic diastolic heart failure; Anemia; Prostate cancer metastatic to bone; Hot flashes; and Knee pain, chronic on his problem list.     is allergic to tetanus toxoids.    Medication List       This list is accurate as of: 09/28/14  4:59 PM.  Always use your most recent med list.               aspirin EC 81 MG tablet  Take 81 mg by mouth daily.  cyanocobalamin 1000 MCG/ML injection  Commonly known as:  (VITAMIN B-12)  Inject 1,000 mcg into the muscle See admin instructions. 1000 mcg IM daily for 5 days (received 7/7-7/11), then weekly for 3 weeks (first dose due 7/18), then monthly     donepezil 5 MG tablet  Commonly known as:  ARICEPT  Take 5 mg by mouth at bedtime.     dorzolamide-timolol 22.3-6.8 MG/ML ophthalmic solution  Commonly known as:  COSOPT  Place 1 drop into both eyes 2 (two) times daily.     latanoprost 0.005 % ophthalmic solution  Commonly known as:  XALATAN  Place 1 drop into both eyes at bedtime.     lisinopril 10 MG tablet    Commonly known as:  PRINIVIL,ZESTRIL  Take 1 tablet by mouth daily.     meclizine 25 MG tablet  Commonly known as:  ANTIVERT  Take 25 mg by mouth daily as needed for dizziness or nausea.     metoprolol 100 MG tablet  Commonly known as:  LOPRESSOR  Take 100 mg by mouth daily.     metoprolol 50 MG tablet  Commonly known as:  LOPRESSOR  Take 50 mg by mouth at bedtime.     mirtazapine 7.5 MG tablet  Commonly known as:  REMERON  Take 7.5 mg by mouth at bedtime.     multivitamin-lutein Caps capsule  Take 1 capsule by mouth 2 (two) times daily.     omeprazole 20 MG capsule  Commonly known as:  PRILOSEC  Take 20 mg by mouth daily.     tamsulosin 0.4 MG Caps capsule  Commonly known as:  FLOMAX  Take 0.4 mg by mouth at bedtime.     warfarin 6 MG tablet  Commonly known as:  COUMADIN  5 mg. Take 5 mg daily except 7mg  on Thursday and Sunday.         PHYSICAL EXAMINATION  Blood pressure 156/64, pulse 56, temperature 98.2 F (36.8 C), temperature source Oral, resp. rate 18, height 5\' 8"  (1.727 m), weight 193 lb 14.4 oz (87.952 kg), SpO2 97 %.  Physical Exam  Constitutional: He is oriented to person, place, and time. Vital signs are normal. He appears unhealthy.  HENT:  Head: Normocephalic and atraumatic.  Mouth/Throat: Oropharynx is clear and moist.  Patient is blind from glaucoma as baseline.  Eyes: Conjunctivae and EOM are normal. Pupils are equal, round, and reactive to light. Right eye exhibits no discharge. Left eye exhibits no discharge. No scleral icterus.  Neck: Normal range of motion. Neck supple. No JVD present. No tracheal deviation present. No thyromegaly present.  Cardiovascular: Normal rate, regular rhythm, normal heart sounds and intact distal pulses.   Pulmonary/Chest: Effort normal. No respiratory distress. He has no wheezes. He has no rales.  Abdominal: Soft. Bowel sounds are normal. He exhibits no distension and no mass. There is no tenderness. There is no  rebound and no guarding.  Musculoskeletal: Normal range of motion. He exhibits no edema or tenderness.  Patient wears a glove to his left hand as baseline.  Patient's right knee with no tenderness or evidence of injury trauma on exam.  Lymphadenopathy:    He has no cervical adenopathy.  Neurological: He is alert and oriented to person, place, and time.  Skin: Skin is warm and dry. No rash noted. No erythema.  Psychiatric: Affect normal.  Nursing note and vitals reviewed.   LABORATORY DATA:. Appointment on 09/28/2014  Component Date Value Ref Range Status  . WBC 09/28/2014  7.1  4.0 - 10.3 10e3/uL Final  . NEUT# 09/28/2014 4.6  1.5 - 6.5 10e3/uL Final  . HGB 09/28/2014 12.5* 13.0 - 17.1 g/dL Final  . HCT 09/28/2014 39.3  38.4 - 49.9 % Final  . Platelets 09/28/2014 172  140 - 400 10e3/uL Final  . MCV 09/28/2014 92.5  79.3 - 98.0 fL Final  . MCH 09/28/2014 29.4  27.2 - 33.4 pg Final  . MCHC 09/28/2014 31.8* 32.0 - 36.0 g/dL Final  . RBC 09/28/2014 4.25  4.20 - 5.82 10e6/uL Final  . RDW 09/28/2014 14.8* 11.0 - 14.6 % Final  . lymph# 09/28/2014 1.6  0.9 - 3.3 10e3/uL Final  . MONO# 09/28/2014 0.7  0.1 - 0.9 10e3/uL Final  . Eosinophils Absolute 09/28/2014 0.2  0.0 - 0.5 10e3/uL Final  . Basophils Absolute 09/28/2014 0.0  0.0 - 0.1 10e3/uL Final  . NEUT% 09/28/2014 64.6  39.0 - 75.0 % Final  . LYMPH% 09/28/2014 22.7  14.0 - 49.0 % Final  . MONO% 09/28/2014 9.5  0.0 - 14.0 % Final  . EOS% 09/28/2014 3.1  0.0 - 7.0 % Final  . BASO% 09/28/2014 0.1  0.0 - 2.0 % Final  . Sodium 09/28/2014 141  136 - 145 mEq/L Final  . Potassium 09/28/2014 4.4  3.5 - 5.1 mEq/L Final  . Chloride 09/28/2014 108  98 - 109 mEq/L Final  . CO2 09/28/2014 28  22 - 29 mEq/L Final  . Glucose 09/28/2014 101  70 - 140 mg/dl Final  . BUN 09/28/2014 16.5  7.0 - 26.0 mg/dL Final  . Creatinine 09/28/2014 1.1  0.7 - 1.3 mg/dL Final  . Total Bilirubin 09/28/2014 0.51  0.20 - 1.20 mg/dL Final  . Alkaline Phosphatase  09/28/2014 148  40 - 150 U/L Final  . AST 09/28/2014 22  5 - 34 U/L Final  . ALT 09/28/2014 18  0 - 55 U/L Final  . Total Protein 09/28/2014 7.0  6.4 - 8.3 g/dL Final  . Albumin 09/28/2014 3.4* 3.5 - 5.0 g/dL Final  . Calcium 09/28/2014 8.7  8.4 - 10.4 mg/dL Final  . Anion Gap 09/28/2014 6  3 - 11 mEq/L Final     RADIOGRAPHIC STUDIES: No results found.  ASSESSMENT/PLAN:    Hot flashes Patient is complaining of some hot flashes today.  He states he experiences approximate 10 hot flashes per day.  Most likely, this is secondary to Lupron therapy.  Patient states that the hot flashes are tolerable at present.  Will continue to monitor closely.  Knee pain, chronic Patient has history of chronic right knee pain; which patient reports is most likely arthritis. He is status post right hip repair in the past; but states that this has completely healed. Patient is able to cannulate with the assistance of the walker; but generally spends most of his time in a wheelchair due to this knee pain.  Advised patient to followup with his primary care provider or his orthopedist for further evaluation of his right knee.  Metastatic cancer to bone Patient has previously diagnosed bone metastasis.  He receives Niger on a monthly basis.  He will receive his Xgeva injection today.  Prostate cancer metastatic to bone Patient received his last Lupron injection on 08/30/2014.  His Lupron is cycled on an every three-month basis.  He will be scheduled to receive his next Lupron 22.5 mg injection on 11/30/2013.  Reviewed all of patient's lab results with both patient and his son today.  PSA results are  still pending; and I informed both patient and his family that we would call as soon as received the results.  Patient knows to call in the interim if he has any new worries or concerns.   Patient stated understanding of all instructions; and was in agreement with this plan of care. The patient knows to call the  clinic with any problems, questions or concerns.   Review/collaboration with Dr. Lona Kettle regarding all aspects of patient's visit today.   Total time spent with patient was 25 minutes;  with greater than 80 percent of that time spent in face to face counseling regarding his symptoms, and coordination of care and follow up.  Disclaimer: This note was dictated with voice recognition software. Similar sounding words can inadvertently be transcribed and may not be corrected upon review.   Drue Second, NP 09/28/2014

## 2014-09-28 NOTE — Assessment & Plan Note (Signed)
Patient has history of chronic right knee pain; which patient reports is most likely arthritis. He is status post right hip repair in the past; but states that this has completely healed. Patient is able to cannulate with the assistance of the walker; but generally spends most of his time in a wheelchair due to this knee pain.  Advised patient to followup with his primary care provider or his orthopedist for further evaluation of his right knee.

## 2014-09-28 NOTE — Assessment & Plan Note (Signed)
Patient received his last Lupron injection on 08/30/2014.  His Lupron is cycled on an every three-month basis.  He will be scheduled to receive his next Lupron 22.5 mg injection on 11/30/2013.  Reviewed all of patient's lab results with both patient and his son today.  PSA results are still pending; and I informed both patient and his family that we would call as soon as received the results.  Patient knows to call in the interim if he has any new worries or concerns.

## 2014-09-28 NOTE — Assessment & Plan Note (Signed)
Patient is complaining of some hot flashes today.  He states he experiences approximate 10 hot flashes per day.  Most likely, this is secondary to Lupron therapy.  Patient states that the hot flashes are tolerable at present.  Will continue to monitor closely.

## 2014-09-28 NOTE — Patient Instructions (Signed)
Denosumab injection What is this medicine? DENOSUMAB (den oh sue mab) slows bone breakdown. Prolia is used to treat osteoporosis in women after menopause and in men. Xgeva is used to prevent bone fractures and other bone problems caused by cancer bone metastases. Xgeva is also used to treat giant cell tumor of the bone. This medicine may be used for other purposes; ask your health care provider or pharmacist if you have questions. COMMON BRAND NAME(S): Prolia, XGEVA What should I tell my health care provider before I take this medicine? They need to know if you have any of these conditions: -dental disease -eczema -infection or history of infections -kidney disease or on dialysis -low blood calcium or vitamin D -malabsorption syndrome -scheduled to have surgery or tooth extraction -taking medicine that contains denosumab -thyroid or parathyroid disease -an unusual reaction to denosumab, other medicines, foods, dyes, or preservatives -pregnant or trying to get pregnant -breast-feeding How should I use this medicine? This medicine is for injection under the skin. It is given by a health care professional in a hospital or clinic setting. If you are getting Prolia, a special MedGuide will be given to you by the pharmacist with each prescription and refill. Be sure to read this information carefully each time. For Prolia, talk to your pediatrician regarding the use of this medicine in children. Special care may be needed. For Xgeva, talk to your pediatrician regarding the use of this medicine in children. While this drug may be prescribed for children as young as 13 years for selected conditions, precautions do apply. Overdosage: If you think you've taken too much of this medicine contact a poison control center or emergency room at once. Overdosage: If you think you have taken too much of this medicine contact a poison control center or emergency room at once. NOTE: This medicine is only for  you. Do not share this medicine with others. What if I miss a dose? It is important not to miss your dose. Call your doctor or health care professional if you are unable to keep an appointment. What may interact with this medicine? Do not take this medicine with any of the following medications: -other medicines containing denosumab This medicine may also interact with the following medications: -medicines that suppress the immune system -medicines that treat cancer -steroid medicines like prednisone or cortisone This list may not describe all possible interactions. Give your health care provider a list of all the medicines, herbs, non-prescription drugs, or dietary supplements you use. Also tell them if you smoke, drink alcohol, or use illegal drugs. Some items may interact with your medicine. What should I watch for while using this medicine? Visit your doctor or health care professional for regular checks on your progress. Your doctor or health care professional may order blood tests and other tests to see how you are doing. Call your doctor or health care professional if you get a cold or other infection while receiving this medicine. Do not treat yourself. This medicine may decrease your body's ability to fight infection. You should make sure you get enough calcium and vitamin D while you are taking this medicine, unless your doctor tells you not to. Discuss the foods you eat and the vitamins you take with your health care professional. See your dentist regularly. Brush and floss your teeth as directed. Before you have any dental work done, tell your dentist you are receiving this medicine. Do not become pregnant while taking this medicine or for 5 months after stopping   it. Women should inform their doctor if they wish to become pregnant or think they might be pregnant. There is a potential for serious side effects to an unborn child. Talk to your health care professional or pharmacist for more  information. What side effects may I notice from receiving this medicine? Side effects that you should report to your doctor or health care professional as soon as possible: -allergic reactions like skin rash, itching or hives, swelling of the face, lips, or tongue -breathing problems -chest pain -fast, irregular heartbeat -feeling faint or lightheaded, falls -fever, chills, or any other sign of infection -muscle spasms, tightening, or twitches -numbness or tingling -skin blisters or bumps, or is dry, peels, or red -slow healing or unexplained pain in the mouth or jaw -unusual bleeding or bruising Side effects that usually do not require medical attention (Report these to your doctor or health care professional if they continue or are bothersome.): -muscle pain -stomach upset, gas This list may not describe all possible side effects. Call your doctor for medical advice about side effects. You may report side effects to FDA at 1-800-FDA-1088. Where should I keep my medicine? This medicine is only given in a clinic, doctor's office, or other health care setting and will not be stored at home. NOTE: This sheet is a summary. It may not cover all possible information. If you have questions about this medicine, talk to your doctor, pharmacist, or health care provider.  2015, [employer]. (2012-04-25 12:37:47)  

## 2014-09-28 NOTE — Assessment & Plan Note (Signed)
Patient has previously diagnosed bone metastasis.  He receives Niger on a monthly basis.  He will receive his Xgeva injection today.

## 2014-09-29 LAB — PSA: PSA: 4.79 ng/mL — AB (ref <=4.00)

## 2014-10-01 ENCOUNTER — Other Ambulatory Visit: Payer: Self-pay

## 2014-10-01 ENCOUNTER — Telehealth: Payer: Self-pay | Admitting: Cardiovascular Disease

## 2014-10-01 NOTE — Telephone Encounter (Signed)
New message           Pt wife would like to know if husband should have cataract surgery

## 2014-10-01 NOTE — Telephone Encounter (Signed)
I spoke with the pt's wife and the pt is scheduled for cataract surgery on 10/11/14. The pt's wife wanted to know if Dr Burt Knack is okay with the pt having this surgery.  I will forward this message to Dr Burt Knack for review.

## 2014-10-01 NOTE — Telephone Encounter (Signed)
This is ok. thx 

## 2014-10-01 NOTE — Telephone Encounter (Signed)
Pt's wife aware.

## 2014-10-01 NOTE — Progress Notes (Signed)
S/w wife and gave her psa result and discussed pt will be seeing Dr Alen Blew on his next appt.

## 2014-10-03 ENCOUNTER — Telehealth: Payer: Self-pay | Admitting: Oncology

## 2014-10-03 NOTE — Telephone Encounter (Signed)
S/w pt's wife Clayton [name] confirming labs/ov/inj for the next 3 months per 11/23 POF, mailed pt updated copy.... KJ

## 2014-10-05 ENCOUNTER — Ambulatory Visit: Payer: Medicare HMO

## 2014-10-11 ENCOUNTER — Ambulatory Visit: Payer: Medicare HMO | Admitting: Cardiovascular Disease

## 2014-10-25 ENCOUNTER — Ambulatory Visit: Payer: Medicare HMO

## 2014-10-25 ENCOUNTER — Other Ambulatory Visit: Payer: Self-pay | Admitting: *Deleted

## 2014-10-25 ENCOUNTER — Other Ambulatory Visit: Payer: Medicare HMO

## 2014-10-25 DIAGNOSIS — C7951 Secondary malignant neoplasm of bone: Secondary | ICD-10-CM

## 2014-10-25 DIAGNOSIS — C61 Malignant neoplasm of prostate: Secondary | ICD-10-CM

## 2014-10-26 ENCOUNTER — Other Ambulatory Visit (HOSPITAL_BASED_OUTPATIENT_CLINIC_OR_DEPARTMENT_OTHER): Payer: Medicare HMO

## 2014-10-26 ENCOUNTER — Encounter: Payer: Self-pay | Admitting: Physician Assistant

## 2014-10-26 ENCOUNTER — Ambulatory Visit (HOSPITAL_BASED_OUTPATIENT_CLINIC_OR_DEPARTMENT_OTHER): Payer: Medicare HMO

## 2014-10-26 ENCOUNTER — Ambulatory Visit (HOSPITAL_BASED_OUTPATIENT_CLINIC_OR_DEPARTMENT_OTHER): Payer: Medicare HMO | Admitting: Physician Assistant

## 2014-10-26 VITALS — BP 177/83 | HR 56 | Temp 98.2°F | Resp 17 | Ht 68.0 in | Wt 195.5 lb

## 2014-10-26 DIAGNOSIS — C61 Malignant neoplasm of prostate: Secondary | ICD-10-CM

## 2014-10-26 DIAGNOSIS — C7951 Secondary malignant neoplasm of bone: Secondary | ICD-10-CM

## 2014-10-26 DIAGNOSIS — D649 Anemia, unspecified: Secondary | ICD-10-CM

## 2014-10-26 DIAGNOSIS — R972 Elevated prostate specific antigen [PSA]: Secondary | ICD-10-CM

## 2014-10-26 DIAGNOSIS — I509 Heart failure, unspecified: Secondary | ICD-10-CM

## 2014-10-26 LAB — CBC WITH DIFFERENTIAL/PLATELET
BASO%: 0.6 % (ref 0.0–2.0)
BASOS ABS: 0 10*3/uL (ref 0.0–0.1)
EOS ABS: 0.2 10*3/uL (ref 0.0–0.5)
EOS%: 3.1 % (ref 0.0–7.0)
HCT: 37.9 % — ABNORMAL LOW (ref 38.4–49.9)
HEMOGLOBIN: 12 g/dL — AB (ref 13.0–17.1)
LYMPH#: 1.9 10*3/uL (ref 0.9–3.3)
LYMPH%: 25.8 % (ref 14.0–49.0)
MCH: 29.4 pg (ref 27.2–33.4)
MCHC: 31.7 g/dL — ABNORMAL LOW (ref 32.0–36.0)
MCV: 92.9 fL (ref 79.3–98.0)
MONO#: 0.7 10*3/uL (ref 0.1–0.9)
MONO%: 8.7 % (ref 0.0–14.0)
NEUT#: 4.6 10*3/uL (ref 1.5–6.5)
NEUT%: 61.8 % (ref 39.0–75.0)
Platelets: 180 10*3/uL (ref 140–400)
RBC: 4.08 10*6/uL — ABNORMAL LOW (ref 4.20–5.82)
RDW: 14.8 % — AB (ref 11.0–14.6)
WBC: 7.5 10*3/uL (ref 4.0–10.3)

## 2014-10-26 LAB — COMPREHENSIVE METABOLIC PANEL (CC13)
ALT: 15 U/L (ref 0–55)
AST: 22 U/L (ref 5–34)
Albumin: 3.4 g/dL — ABNORMAL LOW (ref 3.5–5.0)
Alkaline Phosphatase: 120 U/L (ref 40–150)
Anion Gap: 7 mEq/L (ref 3–11)
BUN: 17.1 mg/dL (ref 7.0–26.0)
CALCIUM: 8.1 mg/dL — AB (ref 8.4–10.4)
CHLORIDE: 105 meq/L (ref 98–109)
CO2: 27 mEq/L (ref 22–29)
Creatinine: 1 mg/dL (ref 0.7–1.3)
EGFR: 65 mL/min/{1.73_m2} — ABNORMAL LOW (ref >90)
Glucose: 95 mg/dl (ref 70–140)
Potassium: 4.5 mEq/L (ref 3.5–5.1)
Sodium: 139 mEq/L (ref 136–145)
Total Bilirubin: 0.55 mg/dL (ref 0.20–1.20)
Total Protein: 6.9 g/dL (ref 6.4–8.3)

## 2014-10-26 MED ORDER — DENOSUMAB 120 MG/1.7ML ~~LOC~~ SOLN
120.0000 mg | Freq: Once | SUBCUTANEOUS | Status: AC
  Administered 2014-10-26: 120 mg via SUBCUTANEOUS
  Filled 2014-10-26: qty 1.7

## 2014-10-26 NOTE — Patient Instructions (Signed)
Denosumab injection What is this medicine? DENOSUMAB (den oh sue mab) slows bone breakdown. Prolia is used to treat osteoporosis in women after menopause and in men. Xgeva is used to prevent bone fractures and other bone problems caused by cancer bone metastases. Xgeva is also used to treat giant cell tumor of the bone. This medicine may be used for other purposes; ask your health care provider or pharmacist if you have questions. COMMON BRAND NAME(S): Prolia, XGEVA What should I tell my health care provider before I take this medicine? They need to know if you have any of these conditions: -dental disease -eczema -infection or history of infections -kidney disease or on dialysis -low blood calcium or vitamin D -malabsorption syndrome -scheduled to have surgery or tooth extraction -taking medicine that contains denosumab -thyroid or parathyroid disease -an unusual reaction to denosumab, other medicines, foods, dyes, or preservatives -pregnant or trying to get pregnant -breast-feeding How should I use this medicine? This medicine is for injection under the skin. It is given by a health care professional in a hospital or clinic setting. If you are getting Prolia, a special MedGuide will be given to you by the pharmacist with each prescription and refill. Be sure to read this information carefully each time. For Prolia, talk to your pediatrician regarding the use of this medicine in children. Special care may be needed. For Xgeva, talk to your pediatrician regarding the use of this medicine in children. While this drug may be prescribed for children as young as 13 years for selected conditions, precautions do apply. Overdosage: If you think you've taken too much of this medicine contact a poison control center or emergency room at once. Overdosage: If you think you have taken too much of this medicine contact a poison control center or emergency room at once. NOTE: This medicine is only for  you. Do not share this medicine with others. What if I miss a dose? It is important not to miss your dose. Call your doctor or health care professional if you are unable to keep an appointment. What may interact with this medicine? Do not take this medicine with any of the following medications: -other medicines containing denosumab This medicine may also interact with the following medications: -medicines that suppress the immune system -medicines that treat cancer -steroid medicines like prednisone or cortisone This list may not describe all possible interactions. Give your health care provider a list of all the medicines, herbs, non-prescription drugs, or dietary supplements you use. Also tell them if you smoke, drink alcohol, or use illegal drugs. Some items may interact with your medicine. What should I watch for while using this medicine? Visit your doctor or health care professional for regular checks on your progress. Your doctor or health care professional may order blood tests and other tests to see how you are doing. Call your doctor or health care professional if you get a cold or other infection while receiving this medicine. Do not treat yourself. This medicine may decrease your body's ability to fight infection. You should make sure you get enough calcium and vitamin D while you are taking this medicine, unless your doctor tells you not to. Discuss the foods you eat and the vitamins you take with your health care professional. See your dentist regularly. Brush and floss your teeth as directed. Before you have any dental work done, tell your dentist you are receiving this medicine. Do not become pregnant while taking this medicine or for 5 months after stopping   it. Women should inform their doctor if they wish to become pregnant or think they might be pregnant. There is a potential for serious side effects to an unborn child. Talk to your health care professional or pharmacist for more  information. What side effects may I notice from receiving this medicine? Side effects that you should report to your doctor or health care professional as soon as possible: -allergic reactions like skin rash, itching or hives, swelling of the face, lips, or tongue -breathing problems -chest pain -fast, irregular heartbeat -feeling faint or lightheaded, falls -fever, chills, or any other sign of infection -muscle spasms, tightening, or twitches -numbness or tingling -skin blisters or bumps, or is dry, peels, or red -slow healing or unexplained pain in the mouth or jaw -unusual bleeding or bruising Side effects that usually do not require medical attention (Report these to your doctor or health care professional if they continue or are bothersome.): -muscle pain -stomach upset, gas This list may not describe all possible side effects. Call your doctor for medical advice about side effects. You may report side effects to FDA at 1-800-FDA-1088. Where should I keep my medicine? This medicine is only given in a clinic, doctor's office, or other health care setting and will not be stored at home. NOTE: This sheet is a summary. It may not cover all possible information. If you have questions about this medicine, talk to your doctor, pharmacist, or health care provider.  2015, [employer]. (2012-04-25 12:37:47) Leuprolide depot injection or implant What is this medicine? LEUPROLIDE (loo PROE lide) is a man-made protein that acts like a natural hormone in the body. It decreases testosterone in men and decreases estrogen in women. In men, this medicine is used to treat advanced prostate cancer. In women, some forms of this medicine may be used to treat endometriosis, uterine fibroids, or other male hormone-related problems. This medicine may be used for other purposes; ask your health care provider or pharmacist if you have questions. COMMON BRAND NAME(S): Eligard, Lupron Depot, Lupron  Depot-Ped, Viadur What should I tell my health care provider before I take this medicine? They need to know if you have any of these conditions: -diabetes -heart disease or previous heart attack -high blood pressure -high cholesterol -osteoporosis -pain or difficulty passing urine -spinal cord metastasis -stroke -tobacco smoker -unusual vaginal bleeding (women) -an unusual or allergic reaction to leuprolide, benzyl alcohol, other medicines, foods, dyes, or preservatives -pregnant or trying to get pregnant -breast-feeding How should I use this medicine? This medicine is for injection into a muscle or for implant or injection under the skin. It is given by a health care professional in a hospital or clinic setting. The specific product will determine how it will be given to you. Make sure you understand which product you receive and how often you will receive it. Talk to your pediatrician regarding the use of this medicine in children. Special care may be needed. Overdosage: If you think you have taken too much of this medicine contact a poison control center or emergency room at once. NOTE: This medicine is only for you. Do not share this medicine with others. What if I miss a dose? It is important not to miss a dose. Call your doctor or health care professional if you are unable to keep an appointment. Depot injections: Depot injections are given either once-monthly, every 12 weeks, every 16 weeks, or every 24 weeks depending on the product you are prescribed. The product you are prescribed   will be based on if you are male or male, and your condition. Make sure you understand your product and dosing. Implant dosing: The implant is removed and replaced once a year. The implant is only used in males. What may interact with this medicine? Do not take this medicine with any of the following medications: -chasteberry This medicine may also interact with the following medications: -herbal or  dietary supplements, like black cohosh or DHEA -male hormones, like estrogens or progestins and birth control pills, patches, rings, or injections -male hormones, like testosterone This list may not describe all possible interactions. Give your health care provider a list of all the medicines, herbs, non-prescription drugs, or dietary supplements you use. Also tell them if you smoke, drink alcohol, or use illegal drugs. Some items may interact with your medicine. What should I watch for while using this medicine? Visit your doctor or health care professional for regular checks on your progress. During the first weeks of treatment, your symptoms may get worse, but then will improve as you continue your treatment. You may get hot flashes, increased bone pain, increased difficulty passing urine, or an aggravation of nerve symptoms. Discuss these effects with your doctor or health care professional, some of them may improve with continued use of this medicine. Male patients may experience a menstrual cycle or spotting during the first months of therapy with this medicine. If this continues, contact your doctor or health care professional. What side effects may I notice from receiving this medicine? Side effects that you should report to your doctor or health care professional as soon as possible: -allergic reactions like skin rash, itching or hives, swelling of the face, lips, or tongue -breathing problems -chest pain -depression or memory disorders -pain in your legs or groin -pain at site where injected or implanted -severe headache -swelling of the feet and legs -visual changes -vomiting Side effects that usually do not require medical attention (report to your doctor or health care professional if they continue or are bothersome): -breast swelling or tenderness -decrease in sex drive or performance -diarrhea -hot flashes -loss of appetite -muscle, joint, or bone pains -nausea -redness  or irritation at site where injected or implanted -skin problems or acne This list may not describe all possible side effects. Call your doctor for medical advice about side effects. You may report side effects to FDA at 1-800-FDA-1088. Where should I keep my medicine? This drug is given in a hospital or clinic and will not be stored at home. NOTE: This sheet is a summary. It may not cover all possible information. If you have questions about this medicine, talk to your doctor, pharmacist, or health care provider.  2015, [employer]. (2010-04-29 14:41:21)  

## 2014-10-26 NOTE — Patient Instructions (Signed)
Follow up with your primary care physician or cardiologist regarding your blood pressure control and medications Follow up with Dr. Alen Blew in 1 month

## 2014-10-26 NOTE — Progress Notes (Signed)
[facility] PROGRESS NOTE DATE OF VISIT: 08/30/2014  Clayton [name], MD [place] [address]  DIAGNOSIS: PROSTATE CANCER WITH BONE METASTASIS.  No chief complaint on file.   CURRENT TREATMENT:  Lupron 22.5 mg q 3 months,started on 06/05/2014 and given on 09/05/2014. Completed one month of casodex 50 mg daily on 06/29/2014.   INTERVAL HISTORY:  Clayton [name] 80 y.o. male 80 y.o. male who was referred for further evaluation of his suspected metastatic prostate cancer and seen initially on 06/05/2014. Today, he is accompanied by his wife. Last visit here was on 09/28/14.   Patient is currently blind due to his history of glaucoma. He complains of continued hot flashes, likely related to his treatment with Lupron. He also notes frequent urination particularly at night. He is on Flomax. Since his last office visit on 09/28/2014 he's not had any emergency room visits or hospitalizations. He continues to have his chronic knee pain but is able to ambulate with help of a walker. He voiced no other specific complaints today. He was started on started monthly Xgeva shots in September 2015 by Dr. Lona Kettle for his metastatic disease in bones to reduce pain, reduce the risk for pathological fractures and delay bone metastasis at new sites. He has noticed some polyuria and nocturia symptoms, his urologist is Dr Risa Grill.   MEDICAL HISTORY: Past Medical History  Diagnosis Date  . CAD (coronary artery disease)     s/p CABG 1999  . HLD (hyperlipidemia)   . HTN (hypertension)   . GERD (gastroesophageal reflux disease)   . H/O hiatal hernia   . Arthritis   . Peripheral vascular disease     aneursym  . Aortic aneurysm     at least 3 cmm infrarenal AAA by lumbar CT 05/22/13 [facility]); 41 mm proximal ascending aorta on echo 05/05/13 [facility])    INTERIM HISTORY: has HYPERLIPIDEMIA; HYPERTENSION, UNSPECIFIED; Coronary atherosclerosis of native coronary  artery; s/p [employer] AVR; Aortic valve disorders; Right inguinal hernia; Preoperative clearance; Fall; Weakness; B12 deficiency; Impaired vision; Chronic anticoagulation; Closed fracture of lesser trochanter of femur; Fall at home; Femoral fracture; Acute on chronic renal failure; Dementia without behavioral disturbance; Acute blood loss anemia; Metastatic cancer to bone; Chronic diastolic heart failure; Acute on chronic diastolic heart failure; Anemia; Prostate cancer metastatic to bone; Hot flashes; and Knee pain, chronic on his problem list.    ALLERGIES:  is allergic to tetanus toxoids.  MEDICATIONS: has a current medication list which includes the following prescription(s): aspirin ec, cyanocobalamin, donepezil, dorzolamide-timolol, latanoprost, lisinopril, meclizine, metoprolol, metoprolol, multivitamin-lutein, omeprazole, tamsulosin, warfarin, and mirtazapine.  SURGICAL HISTORY:  Past Surgical History  Procedure Laterality Date  . Aortic valve replacement  1998  . Coronary artery bypass graft  1998  . Cholecystectomy    . Tonsillectomy    . US extremity*l* Left     arm  . Inguinal hernia repair  09/25/2013    with mesh  . Inguinal hernia repair Right 09/25/2013    Procedure: HERNIA REPAIR INGUINAL ADULT;  Surgeon: Imogene Burn. Georgette Dover, MD;  Location: [facility];  Service: General;  Laterality: Right;  . Insertion of mesh Right 09/25/2013    Procedure: INSERTION OF MESH;  Surgeon: Imogene Burn. Georgette Dover, MD;  Location: [facility];  Service: General;  Laterality: Right;  . Hand surgery Left     MULTIPLE   . Intramedullary (im) nail intertrochanteric Right 05/10/2014    Procedure: Open Reduction Internal Fixation Right Hip;  Surgeon: Mcarthur Rossetti, MD;  Location: [facility];  Service: Orthopedics;  Laterality: Right;    REVIEW OF SYSTEMS:   Constitutional: Denies fevers, chills or abnormal weight loss Eyes: Denies blurriness of vision, legally blind due to macular degeneration/glaucoma Ears, nose,  mouth, throat, and face: Denies mucositis or sore throat, hard of hearing+. Respiratory: Denies cough, dyspnea or wheezes Cardiovascular: Denies palpitation, chest discomfort or lower extremity swelling Gastrointestinal:  Denies nausea, heartburn or change in bowel habits Genitourinary: Reports urinary frequency particularly at night Skin: Denies abnormal skin rashes Lymphatics: Denies new lymphadenopathy or easy bruising Neurological:Denies numbness, tingling or new weaknesses Behavioral/Psych: Mood is stable, no new changes  All other systems were reviewed with the patient and are negative.  PHYSICAL EXAMINATION: ECOG PERFORMANCE STATUS: 2-3  Blood pressure 177/83, pulse 56, temperature 98.2 F (36.8 C), temperature source Oral, resp. rate 17, height 5' 8"  (1.727 m), weight 195 lb 8 oz (88.678 kg), SpO2 96 %.  GENERAL:alert, no distress and comfortable; chronically ill appearing elderly male sitting in his wheelchair. Blind bilaterally  SKIN: skin color, texture, turgor are normal, no rashes or significant lesions  EYES: normal, Conjunctiva are pink and non-injected, sclera clear  OROPHARYNX:no exudate, no erythema and lips, buccal mucosa, and tongue normal  NECK: supple, thyroid normal size, non-tender, without nodularity  LYMPH: no palpable lymphadenopathy in the cervical, axillary or inguinal  LUNGS: clear to auscultation and percussion with normal breathing effort  HEART: regular rate & rhythm and no murmurs and no lower extremity edema  ABDOMEN:abdomen soft, non-tender and normal bowel sounds  Musculoskeletal:no cyanosis of digits and no clubbing  NEURO: alert & oriented x 3 with fluent speech, no focal motor/sensory deficits  Labs:  Lab Results  Component Value Date   WBC 7.5 10/26/2014   HGB 12.0* 10/26/2014   HCT 37.9* 10/26/2014   MCV 92.9 10/26/2014   PLT 180 10/26/2014   NEUTROABS 4.6 10/26/2014      Chemistry      Component Value Date/Time   NA 139 10/26/2014  0933   NA 140 05/24/2014 0357   K 4.5 10/26/2014 0933   K 4.7 05/24/2014 0357   CL 101 05/24/2014 0357   CO2 27 10/26/2014 0933   CO2 28 05/24/2014 0357   BUN 17.1 10/26/2014 0933   BUN 19 05/24/2014 0357   CREATININE 1.0 10/26/2014 0933   CREATININE 1.08 05/24/2014 0357      Component Value Date/Time   CALCIUM 8.1* 10/26/2014 0933   CALCIUM 8.1* 05/24/2014 0357   ALKPHOS 120 10/26/2014 0933   ALKPHOS 176* 05/24/2014 0357   AST 22 10/26/2014 0933   AST 87* 05/24/2014 0357   ALT 15 10/26/2014 0933   ALT 62* 05/24/2014 0357   BILITOT 0.55 10/26/2014 0933   BILITOT 1.8* 05/24/2014 0357     PSA pending from today  PSA 5.67 08/02/2014 PSA 10.66 1 MONTH AGO PSA 60.63 2 MONTHS AGO PSA 70 3 MONTHS AGO   RADIOGRAPHIC STUDIES:  Dg Chest 1 View  EXAM: PORTABLE CHEST - 1 VIEW COMPARISON: May 21, 2014. FINDINGS: Stable cardiomediastinal silhouette. Status post coronary artery bypass graft. No pneumothorax or pleural effusion is noted. Mild central pulmonary vascular congestion is noted. Narrowing of right subacromial space is noted concerning for rotator cuff injury. IMPRESSION:  Mild central pulmonary vascular congestion is noted. No other acute abnormality seen  05/06/2014   CLINICAL DATA:  Fall, right hip pain  EXAM: CHEST - 1 VIEW  COMPARISON:  10/17/2013  FINDINGS:  Cardiomediastinal silhouette is stable. Status post CABG. No acute infiltrate or pleural effusion. No pulmonary edema.  IMPRESSION: No active disease.  Status post CABG.   Electronically Signed   By: Lahoma Crocker M.D.   On: 05/06/2014 13:35   Dg Pelvis 1-2 Views  05/06/2014   CLINICAL DATA:  Fall  EXAM: PELVIS - 1-2 VIEW  COMPARISON:  None.  FINDINGS: There is a displaced fracture of the lesser trochanter. There is also lucency in the intertrochanteric region medially. There is slight varus deformity of the proximal femur. Impaction intertrochanteric fracture is not excluded. Osteopenia.  IMPRESSION: Avulsed fracture of  the lesser trochanter.  Possible intertrochanteric femur fracture.  CT is recommended.   Electronically Signed   By: Maryclare Knotek M.D.   On: 05/06/2014 13:37   Dg Femur Right  05/11/2014   CLINICAL DATA:  Right hip ORIF  EXAM: DG C-ARM 61-120 MIN; RIGHT FEMUR - 2 VIEW  : COMPARISON:  MRI of the right hip from 2 days prior  FINDINGS: Status post intra medullary nail fixation of the femur with dynamic hip screw. No evidence of periprosthetic fracture or other complication.  IMPRESSION: Right femur internal fixation.   Electronically Signed   By: Jorje Guild M.D.   On: 05/11/2014 04:04   Dg Femur Right  05/06/2014   CLINICAL DATA:  Fall  EXAM: RIGHT FEMUR - 2 VIEW  COMPARISON:  None.  FINDINGS: The lesser trochanter is avulsed and displaced superiorly. There are overlapping structures at the intertrochanteric region. There is also slight varus deformity of the proximal femur. Underlying intertrochanteric fracture is not excluded.  IMPRESSION: Avulsed fracture of the right lesser trochanter. There may be an intertrochanteric fracture. Attention on pelvic radiograph is recommended.   Electronically Signed   By: Maryclare Sher M.D.   On: 05/06/2014 13:35   Ct Head Wo Contrast  05/22/2014   CLINICAL DATA:  Confusion  EXAM: CT HEAD WITHOUT CONTRAST  TECHNIQUE: Contiguous axial images were obtained from the base of the skull through the vertex without intravenous contrast.  COMPARISON:  Prior CT from 05/06/2014  FINDINGS: Atrophy with chronic microvascular ischemic changes are stable from prior.  There is no acute intracranial hemorrhage or infarct. No mass lesion or midline shift. Gray-white matter differentiation is well maintained. Ventricles are normal in size without evidence of hydrocephalus. CSF containing spaces are within normal limits. No extra-axial fluid collection.  The calvarium is intact.  Orbital soft tissues are within normal limits.  The paranasal sinuses and mastoid air cells are well pneumatized and  free of fluid.  Scalp soft tissues are unremarkable.  IMPRESSION: 1. No acute intracranial process. 2. Stable atrophy with moderate chronic microvascular ischemic disease.   Electronically Signed   By: Jeannine Boga M.D.   On: 05/22/2014 03:31   Ct Head Wo Contrast  05/06/2014   CLINICAL DATA:  Fall  EXAM: CT HEAD WITHOUT CONTRAST  TECHNIQUE: Contiguous axial images were obtained from the base of the skull through the vertex without intravenous contrast.  COMPARISON:  None.  FINDINGS: Global atrophy. Chronic ischemic changes in the periventricular white matter. Cranium is intact. Mastoid air cells clear. Visualized paranasal sinuses are clear.  IMPRESSION: No acute intracranial pathology.   Electronically Signed   By: Maryclare Rendell M.D.   On: 05/06/2014 13:04   Nm Bone Scan Whole Body  05/07/2014   CLINICAL DATA:  Abnormal CT scan. Question metastatic disease. Pathologic fracture.  EXAM: NUCLEAR MEDICINE WHOLE BODY BONE SCAN  TECHNIQUE: Whole body anterior and posterior images were obtained approximately 3 hours after intravenous injection of radiopharmaceutical.  RADIOPHARMACEUTICALS:  Twenty-five mCi Technetium-99 MDP  COMPARISON:  Bone survey 05/06/2014. CT of the right hip 05/06/2014. Thoracic and lumbar spine MRI 10/18/2013. Focal uptake within the scapula on the right is concerning for metastatic disease. This could be within posterior ribs. Minimal uptake is noted within the left lesser trochanter, potentially degenerative. Uptake within the medial aspect of the right knee is degenerative.  FINDINGS: Focal uptake is present along the lesser trochanter fracture and along the anterior inferior iliac spine. The former corresponds with fracture. The ladder with some degenerative change there may be a sclerotic lesion at the anterior inferior iliac spine as well. Focal uptake is present on the right at T10. There was no abnormality on the previous MRI.  IMPRESSION: 1. Uptake at the site of the fracture.  2. Uptake within the anterior inferior iliac spine on the right is concerning for metastatic disease. This corresponds with a focal sclerotic lesion. 3. The uptake within the right scapula may represent metastatic disease. 4. Focal uptake at T10 on the right was not present on the prior MRI. This may represent new disease.   Electronically Signed   By: Lawrence Santiago M.D.   On: 05/07/2014 16:52   Ct Hip Right Wo Contrast  05/06/2014   CLINICAL DATA:  RIGHT hip pain. Fracture identified on prior radiographs.  EXAM: CT OF THE RIGHT HIP WITHOUT CONTRAST  TECHNIQUE: Multidetector CT imaging was performed according to the standard protocol. Multiplanar CT image reconstructions were also generated.  COMPARISON:  Radiographs 05/06/2014. CT 05/01/2013 at [facility].  FINDINGS: There is an avulsion of the RIGHT lesser trochanter with anterior displacement of 4.3 cm. Proximal retraction is mild, measuring about 2 cm. On the coronal images, there is sclerosis surrounding the lesser trochanter, suggesting an underlying lesion, likely metastatic disease. No intertrochanteric extension of fracture is identified. Moderate RIGHT hip osteoarthritis is present with subchondral cyst in the femoral head and acetabular roof. Fatty atrophy of the gluteus minimus muscle is present. Small nonspecific sclerotic lesion present in the RIGHT iliac bone. Ossification of the L5-S1 disc with ankylosis. Benign bone island in the RIGHT iliac crest. Aortoiliac atherosclerosis.  IMPRESSION: Displaced avulsion fracture of the RIGHT lesser trochanter. Crescentic sclerosis around the location of the avulsed fragment suggests an underlying osseous lesion. This crescentic sclerosis is new compared to the prior CT and is compatible with an underlying osseous metastatic lesion. Followup whole-body bone scan recommended to assess for other foci of metastatic disease. In this older man, PSA should also be obtained.   Electronically Signed   By:  Dereck Ligas M.D.   On: 05/06/2014 15:39   Mr Hip Right Wo Contrast  05/08/2014   CLINICAL DATA:  Status post fall.  Right hip pain.  EXAM: MRI OF THE RIGHT HIP WITHOUT CONTRAST  TECHNIQUE: Multiplanar, multisequence MR imaging was performed. No intravenous contrast was administered.  COMPARISON:  None.  FINDINGS: HIP JOINTS  Both femoral heads appear normal without evidence of acute fracture, dislocation or avascular necrosis. There is no significant hip joint effusion. There is no gross labral or paralabral abnormality.  BONY PELVIS  There is an avulsion fracture of the right lesser trochanter superiorly displaced. There is adjacent soft tissue edema. There is a 2.3 x 5.3 cm osseous lesion within the proximal right femur at site of the lesser trochanteric avulsion.  There is a 2 cm superior right  acetabular T1 hypointense lesion within adjacent 14 mm lesion. There is a 11.4 cm left intertrochanteric T1 hypo intense lesion. There is a small hypo intense lesion in the left posterior acetabulum.  MUSCLES AND TENDONS  Severe edema in the right iliopsoas muscle and tendon with proximal retraction consistent with lesser trochanteric avulsion fracture. Severe soft tissue edema in the extensor and adductor compartment. The gluteus and hamstring tendons appear normal. The piriformis muscles appear symmetric.  INTERNAL PELVIC FINDINGS  The visualized internal pelvic contents are unremarkable.  IMPRESSION: 1. Right lesser trochanteric avulsion fracture with an underlying osseous lesion in the right proximal femur concerning for malignancy. 2. Multiple hypointense lesions involving the right acetabulum, left acetabulum, right proximal femur and left proximal femur most concerning for metastatic disease.   Electronically Signed   By: Kathreen Devoid   On: 05/08/2014 09:05   Dg Bone Survey Met  05/07/2014   CLINICAL DATA:  Metastatic bone survey.  EXAM: METASTATIC BONE SURVEY  COMPARISON:  CT right hip 05/06/2014   FINDINGS: No lytic or sclerotic bone lesions are identified in the axial or appendicular skeleton.  There is an avulsion fracture again demonstrated at the lesser trochanter region of the right hip.  There is degenerative joint disease.  IMPRESSION: Negative metastatic bone survey for lytic or sclerotic metastatic disease.  Avulsion fracture at the lesser trochanter of the right hip.  Degenerative joint disease.   Electronically Signed   By: Kalman Jewels M.D.   On: 05/07/2014 07:33    ASSESSMENT: Clayton [name] 80 y.o. male with a history of Metastatic prostate cancer  PLAN:   1. Metastatic Prostate cancer.  --In his August 05, 2014 visit, Dr. Lona Kettle reviewed his images in detail with findings consistent with Stage IV disease as evidence by multiple bone metastases. Pathology was negative for malignancy. In addition, he had an elevated PSA of 60.63 on 06/07/2014. Testesterone level was 168 on 06/07/2014 prior to receipt of lupron. Given these findings, we explained that he likely has metastatic prostate cancer. Given his advanced age and poor functional status along with multiple co-morbidities, family declined referral to urology for consideration of tissue biopsy which would provide definitive tissue diagnosis.    --Previously, we also reviewed the CT of abdomen and pelvis report obtained from [facility] during his recent admission. It was negative for visceral disease. We discussed treatment options for stage IV prostate cancer includes ADT with Lupron plus casodex versus chemotherapy. He is not a chemotherapy candidate due to his functional status and co-morbidities. We started on ADT on 06/05/2014 We discussed the indications would be palliative therapy, the benefits included improved quality of life and decreased pain, and risks include but are not limited to hot flashes, weight changes, decreased libido, testicular atrophy, tumor flare (an initial rise in serum testosterone concentrations may cause  including bone pain, neuropathy, hematuria, or ureteral or bladder outlet obstruction during the first 2 weeks). We started casodex 50 mg daily to minimize this risk and he completed it on 06/29/2014.   He consented and agreed to proceed with ADT understanding the indications, benefits and risks. His PSA is coming down consistent with treatment response.  --His next Lupron injections would be on 11/22/2014 and q 3 months after that.  --His Xgeva therapy was started on 08/05/2014 and continues on a monthly basis.There is a risk for hypocalcemia and small risk of ONJ associated with Xgeva and that was explained to patient but overall benefits outweigh the risk esp with metastatic  disease in bones and risk of skeletal complications.  --We will plan on following his PSA every 4 weeks. Dr. Lona Kettle added Denosumab Delton See) for bone support given evidence of his bone lesions and that is given every 4 weeks.  If progression or hormone-refractory, Zytiga, Xtandi or Radium 223 will also be considered.   2. Pathologic R. Hip fracture(avulsion, less trochanteric) s/p R ORIF (05/10/2014)  --Completed [facility] at [facility].   3. S/p complications with Hematoma, supratherapautic INR.  -- He has now resumed his anticoagulation.   4. AVR s/p mechanical valve replacement.  --Anticoagulation resumed per his son.   5. CAD/ CABG  --He is on metoprolol 25 mg bid and aspirin.   6. Blindness secondary to Macular Degeneration.   7. ECOG 3.  8. Dementia  -- On Aricept.   9. Diastolic CHF/HTN/HLD - sub-optimal control. Patient advised to follow up with his primary care physician and or his cardiologist regarding his blood pressure control and medications  10.  History of Elevated transaminases NOS  --Unclear etiology. Possible hepatic congestion. We will continue to trend.   11. Normocytic anemia  --Likely anemia of chronic disease with concomitant anemia of blood loss secondary to #3. We will  continue to trend. Less likely related to his mild chronic kidney disease, CrCl is greater than 43.5 mL/min.   12. Follow up.  --PSA monthly and Lupron 22.5 mg q 3 months. Lupron next due January 2016. Follow up with Dr. Alen Blew as scheduled 11/23/2014 with CBC diff, CMET, PSA and testoterone level.  All questions were answered. The patient knows to call the clinic with any problems, questions or concerns. We can certainly see the patient much sooner if necessary.  I spent 25 minutes counseling the patient face to face. The total time spent in the appointment was 30 minutes.    Clayton Adam, PA-C 10/26/2014

## 2014-11-20 ENCOUNTER — Ambulatory Visit: Payer: Medicare HMO | Admitting: Cardiovascular Disease

## 2014-11-22 ENCOUNTER — Other Ambulatory Visit: Payer: Medicare HMO

## 2014-11-23 ENCOUNTER — Telehealth: Payer: Self-pay | Admitting: Oncology

## 2014-11-23 ENCOUNTER — Ambulatory Visit (HOSPITAL_BASED_OUTPATIENT_CLINIC_OR_DEPARTMENT_OTHER): Payer: Medicare HMO | Admitting: Oncology

## 2014-11-23 ENCOUNTER — Other Ambulatory Visit (HOSPITAL_BASED_OUTPATIENT_CLINIC_OR_DEPARTMENT_OTHER): Payer: Medicare HMO

## 2014-11-23 ENCOUNTER — Ambulatory Visit (HOSPITAL_BASED_OUTPATIENT_CLINIC_OR_DEPARTMENT_OTHER): Payer: Medicare HMO

## 2014-11-23 VITALS — BP 165/71 | HR 49 | Resp 18 | Ht 68.0 in

## 2014-11-23 DIAGNOSIS — C61 Malignant neoplasm of prostate: Secondary | ICD-10-CM

## 2014-11-23 DIAGNOSIS — C7951 Secondary malignant neoplasm of bone: Secondary | ICD-10-CM

## 2014-11-23 DIAGNOSIS — D649 Anemia, unspecified: Secondary | ICD-10-CM

## 2014-11-23 LAB — COMPREHENSIVE METABOLIC PANEL (CC13)
ALBUMIN: 3.4 g/dL — AB (ref 3.5–5.0)
ALK PHOS: 98 U/L (ref 40–150)
ALT: 14 U/L (ref 0–55)
ANION GAP: 6 meq/L (ref 3–11)
AST: 22 U/L (ref 5–34)
BILIRUBIN TOTAL: 0.54 mg/dL (ref 0.20–1.20)
BUN: 23.7 mg/dL (ref 7.0–26.0)
CALCIUM: 8 mg/dL — AB (ref 8.4–10.4)
CO2: 26 mEq/L (ref 22–29)
CREATININE: 1.1 mg/dL (ref 0.7–1.3)
Chloride: 107 mEq/L (ref 98–109)
EGFR: 61 mL/min/{1.73_m2} — ABNORMAL LOW (ref >90)
Glucose: 103 mg/dl (ref 70–140)
Potassium: 4.2 mEq/L (ref 3.5–5.1)
SODIUM: 139 meq/L (ref 136–145)
TOTAL PROTEIN: 6.6 g/dL (ref 6.4–8.3)

## 2014-11-23 LAB — CBC WITH DIFFERENTIAL/PLATELET
BASO%: 0.3 % (ref 0.0–2.0)
Basophils Absolute: 0 10*3/uL (ref 0.0–0.1)
EOS ABS: 0.3 10*3/uL (ref 0.0–0.5)
EOS%: 3.2 % (ref 0.0–7.0)
HEMATOCRIT: 37.8 % — AB (ref 38.4–49.9)
HGB: 12.1 g/dL — ABNORMAL LOW (ref 13.0–17.1)
LYMPH%: 23.7 % (ref 14.0–49.0)
MCH: 30 pg (ref 27.2–33.4)
MCHC: 32 g/dL (ref 32.0–36.0)
MCV: 93.6 fL (ref 79.3–98.0)
MONO#: 0.8 10*3/uL (ref 0.1–0.9)
MONO%: 10.4 % (ref 0.0–14.0)
NEUT#: 4.9 10*3/uL (ref 1.5–6.5)
NEUT%: 62.4 % (ref 39.0–75.0)
Platelets: 169 10*3/uL (ref 140–400)
RBC: 4.04 10*6/uL — ABNORMAL LOW (ref 4.20–5.82)
RDW: 13.9 % (ref 11.0–14.6)
WBC: 7.8 10*3/uL (ref 4.0–10.3)
lymph#: 1.8 10*3/uL (ref 0.9–3.3)

## 2014-11-23 MED ORDER — DENOSUMAB 120 MG/1.7ML ~~LOC~~ SOLN
120.0000 mg | Freq: Once | SUBCUTANEOUS | Status: AC
  Administered 2014-11-23: 120 mg via SUBCUTANEOUS
  Filled 2014-11-23: qty 1.7

## 2014-11-23 NOTE — Progress Notes (Signed)
[name] PROGRESS NOTE DATE OF VISIT: 08/30/2014  [name], MD [place] [address]  DIAGNOSIS: 81 year old  gentleman with hormone sensitive metastatic prostate cancer diagnosed in June 2015. He presented with bony metastasis and a PSA of 70.23.   CURRENT TREATMENT:   Lupron 22.5 mg q 3 months started on 06/05/2014 and given on 09/05/2014.  Completed one month of casodex 50 mg daily on 06/29/2014.  Xgeva given in September 2015.  INTERVAL HISTORY: Mr. Lanny Cramp presents today for a follow-up visit. Since the last visit, he is continue to do reasonably fair. He tolerated Lupron with increased hot flashes. He also reported some insomnia. But does not report any back pain or shoulder pain. He does have chronic knee pain. He is currently blind due to his history of glaucoma.  He also notes frequent urination particularly at night. He is on Flomax. His mobility is very limited confined to a wheelchair due to his arthritis and blindness. He does not report any headaches or blurry vision or syncope. He does not report any fevers, chills, sweats or weight loss. He does not report any chest pain, palpitation orthopnea or dyspnea on exertion. He does not report any cough or hemoptysis or wheezing. Does not report any nausea, vomiting, abdominal pain. He does not report any constipation or diarrhea. Does not report any hematuria but does report nocturia and frequency. Rest of his review of systems unremarkable.  MEDICAL HISTORY: Past Medical History  Diagnosis Date  . CAD (coronary artery disease)     s/p CABG 1999  . HLD (hyperlipidemia)   . HTN (hypertension)   . GERD (gastroesophageal reflux disease)   . H/O hiatal hernia   . Arthritis   . Peripheral vascular disease     aneursym  . Aortic aneurysm     at least 3 cmm infrarenal AAA by lumbar CT 05/22/13 [facility]); 41 mm proximal ascending aorta on echo 05/05/13 [facility])      ALLERGIES:  is allergic to tetanus toxoids.  MEDICATIONS: has a current medication list which includes the following prescription(s): aspirin ec, cyanocobalamin, donepezil, dorzolamide-timolol, latanoprost, lisinopril, meclizine, metoprolol, metoprolol, multivitamin-lutein, omeprazole, tamsulosin, and warfarin.  SECOG PERFORMANCE STATUS:  3  Blood pressure 165/71, pulse 49, resp. rate 18, height 5\' 8"  (1.727 m), weight 0 lb (0 kg), SpO2 96 %.  GENERAL:alert, no distress and comfortable; chronically ill appearing. SKIN: No rashes or significant lesions  EYES: normal, Conjunctiva are pink and non-injected, sclera clear  OROPHARYNX:no exudate, no erythema and lips, buccal mucosa, and tongue normal  NECK: supple, thyroid normal size, non-tender. No lymphadenopathy noted. LYMPH: no palpable lymphadenopathy in the cervical, axillary or inguinal  LUNGS: clear to auscultation and percussion with normal breathing effort  HEART: regular rate & rhythm and no murmurs and no lower extremity edema  ABDOMEN:abdomen soft, non-tender and normal bowel sounds  Musculoskeletal:no cyanosis of digits and no clubbing  NEURO: No focal deficits noted.  Labs:  Lab Results  Component Value Date   WBC 7.5 10/26/2014   HGB 12.0* 10/26/2014   HCT 37.9* 10/26/2014   MCV 92.9 10/26/2014   PLT 180 10/26/2014   NEUTROABS 4.6 10/26/2014      Chemistry      Component Value Date/Time   NA 139 10/26/2014 0933   NA 140 05/24/2014 0357   K 4.5 10/26/2014 0933   K 4.7 05/24/2014 0357   CL 101 05/24/2014 0357   CO2 27 10/26/2014 0933  CO2 28 05/24/2014 0357   BUN 17.1 10/26/2014 0933   BUN 19 05/24/2014 0357   CREATININE 1.0 10/26/2014 0933   CREATININE 1.08 05/24/2014 0357      Component Value Date/Time   CALCIUM 8.1* 10/26/2014 0933   CALCIUM 8.1* 05/24/2014 0357   ALKPHOS 120 10/26/2014 0933   ALKPHOS 176* 05/24/2014 0357   AST 22 10/26/2014 0933   AST 87* 05/24/2014 0357   ALT 15 10/26/2014  0933   ALT 62* 05/24/2014 0357   BILITOT 0.55 10/26/2014 0933   BILITOT 1.8* 05/24/2014 0357      Results for ORIE, BAXENDALE (MRN [id number]) as of 11/23/2014 08:47  Ref. Range 05/06/2014 22:15 06/05/2014 09:59 07/05/2014 08:53 08/02/2014 13:56 08/30/2014 08:19 09/28/2014 09:01  PSA Latest Range: <=4.00 ng/mL 70.23 (H) 60.63 (H) 10.66 (H) 5.67 (H) 4.61 (H) 4.79 (H)      ASSESSMENT AND PLAN : Quanell T Basley 81 y.o. male with a history of Metastatic prostate cancer    1. Metastatic Prostate cancer. His disease appears to be hormone sensitive with PSA dropping from 70.23 down to 4.6. His last PSA in November 2015 up to 4.79. He is currently on Lupron only and have tolerated it reasonably well. The plan is to continue with the current regimen and repeat Lupron every 3 months. His PSA continues to rise we'll and Casodex again to his regimen.  I had a discussion today with Mr. Gitto and his family regarding his natural course of this disease and prognosis. I wanted to be clear that this is an incurable malignancy and all these treatments are merely palliative. He is not really a candidate for any aggressive therapy and as long as he is tolerating palliative therapy will continue with that. He will likely require Lupron and possibly other hormonal agents for the duration of his life. He understands without these interventions, he will develop overwhelming malignancy that will lead to further morbidity and possible death.  2. Pathologic R. Hip fracture(avulsion, less trochanteric) s/p R ORIF (05/10/2014)   3. Bone directed therapy:  He continues to be on Xgeva which I will increase in frequency every 3 months. It will be a lot easier for him to come to clinic every 3 months versus monthly basis.  4. AVR s/p mechanical valve replacement.  Anticoagulation resumed.  5. CAD/ CABG  He is on metoprolol 25 mg bid and aspirin.   6. Diastolic CHF/HTN/HLD - follows up with his primary care physician and  cardiologist. regarding this issue.   7. Normocytic anemia likely multifactorial in nature due to malignancy and chronic disease. We'll continue to monitor this.  8. Follow-up: Will be in 3 months with repeat Lupron and Xgeva.  All her questions were answered today their satisfaction.   [facility], MD 11/23/2014.

## 2014-11-23 NOTE — Patient Instructions (Signed)
Denosumab injection What is this medicine? DENOSUMAB (den oh sue mab) slows bone breakdown. Prolia is used to treat osteoporosis in women after menopause and in men. Xgeva is used to prevent bone fractures and other bone problems caused by cancer bone metastases. Xgeva is also used to treat giant cell tumor of the bone. This medicine may be used for other purposes; ask your health care provider or pharmacist if you have questions. COMMON BRAND NAME(S): Prolia, XGEVA What should I tell my health care provider before I take this medicine? They need to know if you have any of these conditions: -dental disease -eczema -infection or history of infections -kidney disease or on dialysis -low blood calcium or vitamin D -malabsorption syndrome -scheduled to have surgery or tooth extraction -taking medicine that contains denosumab -thyroid or parathyroid disease -an unusual reaction to denosumab, other medicines, foods, dyes, or preservatives -pregnant or trying to get pregnant -breast-feeding How should I use this medicine? This medicine is for injection under the skin. It is given by a health care professional in a hospital or clinic setting. If you are getting Prolia, a special MedGuide will be given to you by the pharmacist with each prescription and refill. Be sure to read this information carefully each time. For Prolia, talk to your pediatrician regarding the use of this medicine in children. Special care may be needed. For Xgeva, talk to your pediatrician regarding the use of this medicine in children. While this drug may be prescribed for children as young as 13 years for selected conditions, precautions do apply. Overdosage: If you think you've taken too much of this medicine contact a poison control center or emergency room at once. Overdosage: If you think you have taken too much of this medicine contact a poison control center or emergency room at once. NOTE: This medicine is only for  you. Do not share this medicine with others. What if I miss a dose? It is important not to miss your dose. Call your doctor or health care professional if you are unable to keep an appointment. What may interact with this medicine? Do not take this medicine with any of the following medications: -other medicines containing denosumab This medicine may also interact with the following medications: -medicines that suppress the immune system -medicines that treat cancer -steroid medicines like prednisone or cortisone This list may not describe all possible interactions. Give your health care provider a list of all the medicines, herbs, non-prescription drugs, or dietary supplements you use. Also tell them if you smoke, drink alcohol, or use illegal drugs. Some items may interact with your medicine. What should I watch for while using this medicine? Visit your doctor or health care professional for regular checks on your progress. Your doctor or health care professional may order blood tests and other tests to see how you are doing. Call your doctor or health care professional if you get a cold or other infection while receiving this medicine. Do not treat yourself. This medicine may decrease your body's ability to fight infection. You should make sure you get enough calcium and vitamin D while you are taking this medicine, unless your doctor tells you not to. Discuss the foods you eat and the vitamins you take with your health care professional. See your dentist regularly. Brush and floss your teeth as directed. Before you have any dental work done, tell your dentist you are receiving this medicine. Do not become pregnant while taking this medicine or for 5 months after stopping   it. Women should inform their doctor if they wish to become pregnant or think they might be pregnant. There is a potential for serious side effects to an unborn child. Talk to your health care professional or pharmacist for more  information. What side effects may I notice from receiving this medicine? Side effects that you should report to your doctor or health care professional as soon as possible: -allergic reactions like skin rash, itching or hives, swelling of the face, lips, or tongue -breathing problems -chest pain -fast, irregular heartbeat -feeling faint or lightheaded, falls -fever, chills, or any other sign of infection -muscle spasms, tightening, or twitches -numbness or tingling -skin blisters or bumps, or is dry, peels, or red -slow healing or unexplained pain in the mouth or jaw -unusual bleeding or bruising Side effects that usually do not require medical attention (Report these to your doctor or health care professional if they continue or are bothersome.): -muscle pain -stomach upset, gas This list may not describe all possible side effects. Call your doctor for medical advice about side effects. You may report side effects to FDA at 1-800-FDA-1088. Where should I keep my medicine? This medicine is only given in a clinic, doctor's office, or other health care setting and will not be stored at home. NOTE: This sheet is a summary. It may not cover all possible information. If you have questions about this medicine, talk to your doctor, pharmacist, or health care provider.  2015, [employer]. (2012-04-25 12:37:47)  

## 2014-11-23 NOTE — Telephone Encounter (Signed)
Gave avs & cal for April. °

## 2014-11-24 LAB — TESTOSTERONE: Testosterone: 33 ng/dL — ABNORMAL LOW (ref 300–890)

## 2014-11-24 LAB — PSA: PSA: 3.21 ng/mL (ref <=4.00)

## 2014-12-17 ENCOUNTER — Ambulatory Visit: Payer: Medicare HMO | Admitting: Cardiovascular Disease

## 2014-12-21 ENCOUNTER — Ambulatory Visit: Payer: Medicare HMO

## 2014-12-21 ENCOUNTER — Ambulatory Visit: Payer: Medicare HMO | Admitting: Oncology

## 2014-12-21 ENCOUNTER — Other Ambulatory Visit: Payer: Medicare HMO

## 2014-12-21 NOTE — Progress Notes (Signed)
Spoke with patient's wife, let her know that his PSA is normal. Reminded her of upcoming appt for 02/22/15

## 2014-12-24 ENCOUNTER — Telehealth: Payer: Self-pay

## 2014-12-24 NOTE — Telephone Encounter (Signed)
Pt c/o r hip pain last 2-3 days, interferes with sleep. Couldn't turn over. Hurts when stands on it. Has not tried any OTC meds. No initiating event. No fever. No GI upset. Suggested OTC pain meds, ice to hip. Suggested call PCP, Dr Bea Graff.who is closer to pt's home in [place]. Call back if these steps do not help.

## 2014-12-27 ENCOUNTER — Emergency Department (HOSPITAL_COMMUNITY): Payer: Medicare HMO

## 2014-12-27 ENCOUNTER — Emergency Department (HOSPITAL_COMMUNITY)
Admission: EM | Admit: 2014-12-27 | Discharge: 2014-12-27 | Disposition: A | Discharge status: Home / Self Care | Payer: Medicare HMO | Attending: Emergency Medicine | Admitting: Emergency Medicine

## 2014-12-27 ENCOUNTER — Encounter (HOSPITAL_COMMUNITY): Payer: Self-pay | Admitting: *Deleted

## 2014-12-27 DIAGNOSIS — K219 Gastro-esophageal reflux disease without esophagitis: Secondary | ICD-10-CM | POA: Insufficient documentation

## 2014-12-27 DIAGNOSIS — Z8781 Personal history of (healed) traumatic fracture: Secondary | ICD-10-CM | POA: Insufficient documentation

## 2014-12-27 DIAGNOSIS — C61 Malignant neoplasm of prostate: Secondary | ICD-10-CM | POA: Insufficient documentation

## 2014-12-27 DIAGNOSIS — M25551 Pain in right hip: Secondary | ICD-10-CM | POA: Insufficient documentation

## 2014-12-27 DIAGNOSIS — R1030 Lower abdominal pain, unspecified: Secondary | ICD-10-CM | POA: Diagnosis not present

## 2014-12-27 DIAGNOSIS — Z79899 Other long term (current) drug therapy: Secondary | ICD-10-CM | POA: Insufficient documentation

## 2014-12-27 DIAGNOSIS — C7951 Secondary malignant neoplasm of bone: Secondary | ICD-10-CM | POA: Diagnosis not present

## 2014-12-27 DIAGNOSIS — I10 Essential (primary) hypertension: Secondary | ICD-10-CM | POA: Diagnosis not present

## 2014-12-27 DIAGNOSIS — M545 Low back pain: Secondary | ICD-10-CM | POA: Insufficient documentation

## 2014-12-27 DIAGNOSIS — Z87891 Personal history of nicotine dependence: Secondary | ICD-10-CM | POA: Diagnosis not present

## 2014-12-27 DIAGNOSIS — Z951 Presence of aortocoronary bypass graft: Secondary | ICD-10-CM | POA: Insufficient documentation

## 2014-12-27 DIAGNOSIS — Z7901 Long term (current) use of anticoagulants: Secondary | ICD-10-CM | POA: Insufficient documentation

## 2014-12-27 DIAGNOSIS — Z8639 Personal history of other endocrine, nutritional and metabolic disease: Secondary | ICD-10-CM | POA: Diagnosis not present

## 2014-12-27 DIAGNOSIS — M1990 Unspecified osteoarthritis, unspecified site: Secondary | ICD-10-CM | POA: Insufficient documentation

## 2014-12-27 DIAGNOSIS — R1031 Right lower quadrant pain: Secondary | ICD-10-CM

## 2014-12-27 DIAGNOSIS — I2510 Atherosclerotic heart disease of native coronary artery without angina pectoris: Secondary | ICD-10-CM | POA: Insufficient documentation

## 2014-12-27 LAB — URINALYSIS, ROUTINE W REFLEX MICROSCOPIC
GLUCOSE, UA: NEGATIVE mg/dL
Hgb urine dipstick: NEGATIVE
Ketones, ur: NEGATIVE mg/dL
LEUKOCYTES UA: NEGATIVE
Nitrite: NEGATIVE
PH: 5 (ref 5.0–8.0)
Protein, ur: NEGATIVE mg/dL
Specific Gravity, Urine: 1.025 (ref 1.005–1.030)
Urobilinogen, UA: 0.2 mg/dL (ref 0.0–1.0)

## 2014-12-27 LAB — CBC WITH DIFFERENTIAL/PLATELET
BASOS ABS: 0 10*3/uL (ref 0.0–0.1)
BASOS PCT: 0 % (ref 0–1)
EOS PCT: 3 % (ref 0–5)
Eosinophils Absolute: 0.2 10*3/uL (ref 0.0–0.7)
HEMATOCRIT: 39.8 % (ref 39.0–52.0)
Hemoglobin: 12.8 g/dL — ABNORMAL LOW (ref 13.0–17.0)
Lymphocytes Relative: 28 % (ref 12–46)
Lymphs Abs: 1.8 10*3/uL (ref 0.7–4.0)
MCH: 30 pg (ref 26.0–34.0)
MCHC: 32.2 g/dL (ref 30.0–36.0)
MCV: 93.4 fL (ref 78.0–100.0)
Monocytes Absolute: 0.6 10*3/uL (ref 0.1–1.0)
Monocytes Relative: 9 % (ref 3–12)
Neutro Abs: 3.9 10*3/uL (ref 1.7–7.7)
Neutrophils Relative %: 60 % (ref 43–77)
PLATELETS: 172 10*3/uL (ref 150–400)
RBC: 4.26 MIL/uL (ref 4.22–5.81)
RDW: 14 % (ref 11.5–15.5)
WBC: 6.5 10*3/uL (ref 4.0–10.5)

## 2014-12-27 LAB — COMPREHENSIVE METABOLIC PANEL
ALT: 22 U/L (ref 0–53)
AST: 29 U/L (ref 0–37)
Albumin: 3.7 g/dL (ref 3.5–5.2)
Alkaline Phosphatase: 79 U/L (ref 39–117)
Anion gap: 5 (ref 5–15)
BUN: 25 mg/dL — ABNORMAL HIGH (ref 6–23)
CO2: 29 mmol/L (ref 19–32)
Calcium: 8.5 mg/dL (ref 8.4–10.5)
Chloride: 104 mmol/L (ref 96–112)
Creatinine, Ser: 1.2 mg/dL (ref 0.50–1.35)
GFR calc Af Amer: 61 mL/min — ABNORMAL LOW (ref >90)
GFR calc non Af Amer: 53 mL/min — ABNORMAL LOW (ref >90)
Glucose, Bld: 110 mg/dL — ABNORMAL HIGH (ref 70–99)
Potassium: 4.4 mmol/L (ref 3.5–5.1)
Sodium: 138 mmol/L (ref 135–145)
Total Bilirubin: 0.9 mg/dL (ref 0.3–1.2)
Total Protein: 6.8 g/dL (ref 6.0–8.3)

## 2014-12-27 LAB — PROTIME-INR
INR: 2.46 — ABNORMAL HIGH (ref 0.00–1.49)
Prothrombin Time: 26.8 seconds — ABNORMAL HIGH (ref 11.6–15.2)

## 2014-12-27 MED ORDER — IOHEXOL 300 MG/ML  SOLN
80.0000 mL | Freq: Once | INTRAMUSCULAR | Status: AC | PRN
  Administered 2014-12-27: 80 mL via INTRAVENOUS

## 2014-12-27 NOTE — ED Provider Notes (Signed)
  Face-to-face evaluation   History: Patient presents for evaluation of right groin and right lower quadrant abdominal pain.  Symptoms ongoing for several days.  No specific trauma.  The discomfort makes it hard to stand and walk.  Physical exam: Alert, elderly man who is uncomfortable.  Right groin has a pulsatile mass about 3 cm in diameter; it is very tender to palpation   Medical screening examination/treatment/procedure(s) were conducted as a shared visit with non-physician practitioner(s) and myself.  I personally evaluated the patient during the encounter   Richarda Blade, MD 12/28/14 [phone]

## 2014-12-27 NOTE — ED Notes (Signed)
Pt c/o right hip and right lower back pain for 3-4 days. Pt denies any recent falls or injury to back and hip.

## 2014-12-27 NOTE — ED Provider Notes (Signed)
CSN: [id number]     Arrival date & time 12/27/14  1530 History   First MD Initiated Contact with Patient 12/27/14 1810     Chief Complaint  Patient presents with  . Back Pain  . Hip Pain   [name] is a 81 y.o. male who is wheelchair bound with a history of aortic valve replacement, hypertension, GERD, coronary artery disease, and arthritis, who presents to the ED with his wife complaining of right hip pain and bilateral low back pain for the past 4 days that is worse at night. They deny any trauma or injury to his right hip. They deny any recent falls. At the time of my evaluation the patient reports no pain as he is not moving. Patient reports his pain is worse with moving or trying to roll over. The patient had right femur fracture last July and has been in physical therapy since until 4 days ago. They report this pain began after stopping physical therapy. Upon further questioning the patient also complains of right inguinal pain, his wife reports he has not complained of this until now. He reports only pain with pushing. The patient is on Coumadin for a previous valve replacement. He reports he's been eating and drinking normally. The patient denies fevers, recent illness, chest pain, cough, wheezing, shortness of breath, nausea, vomiting, diarrhea, constipation, trauma or rashes.   (Consider location/radiation/quality/duration/timing/severity/associated sxs/prior Treatment) HPI  Past Medical History  Diagnosis Date  . CAD (coronary artery disease)     s/p CABG 1999  . HLD (hyperlipidemia)   . HTN (hypertension)   . GERD (gastroesophageal reflux disease)   . H/O hiatal hernia   . Arthritis   . Peripheral vascular disease     aneursym  . Aortic aneurysm     at least 3 cmm infrarenal AAA by lumbar CT 05/22/13 [facility]); 41 mm proximal ascending aorta on echo 05/05/13 [facility])   Past Surgical History  Procedure Laterality Date  . Aortic valve replacement  1998  .  Coronary artery bypass graft  1998  . Cholecystectomy    . Tonsillectomy    . US extremity*l* Left     arm  . Inguinal hernia repair  09/25/2013    with mesh  . Inguinal hernia repair Right 09/25/2013    Procedure: HERNIA REPAIR INGUINAL ADULT;  Surgeon: Imogene Burn. Georgette Dover, MD;  Location: [place];  Service: General;  Laterality: Right;  . Insertion of mesh Right 09/25/2013    Procedure: INSERTION OF MESH;  Surgeon: Imogene Burn. Georgette Dover, MD;  Location: [place];  Service: General;  Laterality: Right;  . Hand surgery Left     MULTIPLE   . Intramedullary (im) nail intertrochanteric Right 05/10/2014    Procedure: Open Reduction Internal Fixation Right Hip;  Surgeon: Mcarthur Rossetti, MD;  Location: [place];  Service: Orthopedics;  Laterality: Right;   Family History  Problem Relation Age of Onset  . Coronary artery disease    . Heart attack Brother    History  Substance Use Topics  . Smoking status: Former Research scientist (life sciences)  . Smokeless tobacco: Never Used     Comment: quit smoking 45 years ago "  . Alcohol Use: No    Review of Systems  Constitutional: Negative for fever and chills.  HENT: Negative for congestion and sore throat.   Eyes: Negative for visual disturbance.  Respiratory: Negative for cough, shortness of breath and wheezing.   Cardiovascular: Negative for chest pain and palpitations.  Gastrointestinal:  Positive for abdominal pain. Negative for nausea, vomiting, diarrhea and blood in stool.  Genitourinary: Negative for dysuria and hematuria.  Musculoskeletal: Positive for back pain. Negative for joint swelling and neck pain.       Right hip pain  Skin: Negative for rash and wound.  Neurological: Negative for headaches.      Allergies  Tetanus toxoids  Home Medications   Prior to Admission medications   Medication Sig Start Date End Date Taking? Authorizing Provider  aspirin EC 81 MG tablet Take 81 mg by mouth daily.   Yes Historical Provider, MD  cyanocobalamin (,VITAMIN  B-12,) 1000 MCG/ML injection Inject 1,000 mcg into the muscle See admin instructions. 1000 mcg IM daily for 5 days (received 7/7-7/11), then weekly for 3 weeks (first dose due 7/18), then monthly   Yes Historical Provider, MD  denosumab (XGEVA) 120 MG/1.7ML SOLN injection Inject 120 mg into the skin once.   Yes Historical Provider, MD  donepezil (ARICEPT) 5 MG tablet Take 5 mg by mouth at bedtime.  02/26/14  Yes Historical Provider, MD  dorzolamide-timolol (COSOPT) 22.3-6.8 MG/ML ophthalmic solution Place 1 drop into both eyes 2 (two) times daily.  07/26/13  Yes Historical Provider, MD  latanoprost (XALATAN) 0.005 % ophthalmic solution Place 1 drop into both eyes at bedtime.  07/26/13  Yes Historical Provider, MD  leuprolide (LUPRON) 22.5 MG injection Inject 22.5 mg into the muscle every 3 (three) months.   Yes Historical Provider, MD  lisinopril (PRINIVIL,ZESTRIL) 10 MG tablet Take 1 tablet by mouth daily. 02/26/14  Yes Historical Provider, MD  meclizine (ANTIVERT) 25 MG tablet Take 25 mg by mouth daily as needed for dizziness or nausea.  05/19/13  Yes Historical Provider, MD  metoprolol (LOPRESSOR) 100 MG tablet Take 100 mg by mouth daily.    Yes Historical Provider, MD  metoprolol (LOPRESSOR) 50 MG tablet Take 50 mg by mouth at bedtime.   Yes Historical Provider, MD  multivitamin-lutein (OCUVITE-LUTEIN) CAPS Take 1 capsule by mouth 2 (two) times daily.     Yes Historical Provider, MD  omeprazole (PRILOSEC) 20 MG capsule Take 20 mg by mouth daily.  03/27/14  Yes Historical Provider, MD  Tamsulosin HCl (FLOMAX) 0.4 MG CAPS Take 0.4 mg by mouth at bedtime.    Yes Historical Provider, MD  warfarin (COUMADIN) 1 MG tablet Take 7 mg by mouth daily. Pt takes  5 mg and 2 1 mg tablets to equal a total of 7 mg on Sunday Thursday Saturday   Yes Historical Provider, MD  warfarin (COUMADIN) 5 MG tablet Take 5-7 mg by mouth daily. Patient uses 5 mg tablet and 2 of the 1 mg to equal a total dose of 7 mg on Sunday  Thursday Saturday. Rest of the days patient takes 5 mg.   Yes Historical Provider, MD  warfarin (COUMADIN) 6 MG tablet 5 mg. Take 5 mg daily except 7mg  on Thursday and Sunday.    Historical Provider, MD   BP 191/87 mmHg  Pulse 64  Temp(Src) 97.6 F (36.4 C) (Oral)  Resp 14  Ht 5\' 8"  (1.727 m)  Wt 190 lb (86.183 kg)  BMI 28.90 kg/m2  SpO2 97% Physical Exam  Constitutional: He appears well-developed and well-nourished. No distress.  HENT:  Head: Normocephalic and atraumatic.  Mouth/Throat: Oropharynx is clear and moist.  Eyes: Conjunctivae are normal. Pupils are equal, round, and reactive to light. Right eye exhibits no discharge. Left eye exhibits no discharge.  Neck: Neck supple. No JVD present.  Cardiovascular: Normal rate, regular rhythm and intact distal pulses.   Bilateral radial, posterior tibialis and dorsalis pedis pulses are intact.    Pulmonary/Chest: Effort normal and breath sounds normal. No respiratory distress. He has no wheezes. He has no rales.  Abdominal: Soft. Bowel sounds are normal. He exhibits no distension and no mass. There is tenderness. There is no rebound and no guarding.  Right inguinal tenderness to palpation with palpable mass. No hernias or masses appreciated. No rashes or lesions. His abdomen is otherwise non-tender.   Musculoskeletal: Normal range of motion. He exhibits no edema or tenderness.  Patient has full ROM of his right hip and lower leg. His right hip is nontender to palpation. No deformity noted. No edema noted. Patient is spontaneously moving all extremities in a coordinated fashion exhibiting good strength.  Lymphadenopathy:    He has no cervical adenopathy.  Neurological: He is alert. Coordination normal.  Skin: Skin is warm and dry. No rash noted. He is not diaphoretic. No erythema. No pallor.  Psychiatric: He has a normal mood and affect. His behavior is normal.  Nursing note and vitals reviewed.   ED Course  Procedures (including  critical care time) Labs Review Labs Reviewed  CBC WITH DIFFERENTIAL/PLATELET - Abnormal; Notable for the following:    Hemoglobin 12.8 (*)    All other components within normal limits  COMPREHENSIVE METABOLIC PANEL - Abnormal; Notable for the following:    Glucose, Bld 110 (*)    BUN 25 (*)    GFR calc non Af Amer 53 (*)    GFR calc Af Amer 61 (*)    All other components within normal limits  URINALYSIS, ROUTINE W REFLEX MICROSCOPIC - Abnormal; Notable for the following:    Bilirubin Urine SMALL (*)    All other components within normal limits  PROTIME-INR - Abnormal; Notable for the following:    Prothrombin Time 26.8 (*)    INR 2.46 (*)    All other components within normal limits    Imaging Review Ct Abdomen Pelvis W Contrast  12/27/2014   CLINICAL DATA:  Right hip and low back pain for 3-4 days.  EXAM: CT ABDOMEN AND PELVIS WITH CONTRAST  TECHNIQUE: Multidetector CT imaging of the abdomen and pelvis was performed using the standard protocol following bolus administration of intravenous contrast.  CONTRAST:  80 mL OMNIPAQUE IOHEXOL 300 MG/ML  SOLN  COMPARISON:  CT abdomen and pelvis 05/21/2014 and 05/01/2013.  FINDINGS: The lung bases are clear. No pleural or pericardial effusion. Patient status post aortic valve replacement. Small hiatal hernia is noted.  The gallbladder has been removed. The liver, adrenal glands, spleen and pancreas appear normal. Simple bilateral renal cysts are unchanged.  Duodenum diverticulum is again identified. The small bowel is otherwise unremarkable. Diverticulosis without diverticulitis is noted. The colon otherwise appears normal. There is no bowel obstruction. Minimal dilatation of the descending abdominal aorta at 2.9 cm is unchanged.  Sclerotic lesion in the T10 vertebral body appears more dense. A new punctate sclerotic lesion is seen in the posterior aspect of L2. Also seen is a sclerotic lesion in the intertrochanteric left femur which in retrospect is  present on the prior examination but much more conspicuous today. Sclerotic lesion in the right ilium is again seen. There is a new punctate sclerotic lesion in the left ilium. No fracture is identified.  IMPRESSION: Dominant finding is increase in sclerotic bone lesions consistent with metastatic disease. Primary lesion is not identified. Recommend correlation with PSA.  Small hiatal hernia.  Diverticulosis without diverticulitis.   Electronically Signed   By: Inge Rise M.D.   On: 12/27/2014 21:19     EKG Interpretation None      Filed Vitals:   12/27/14 2117 12/27/14 2130 12/27/14 2230 12/27/14 2251  BP: 173/76 174/72 191/87   Pulse: 68 67 64   Temp:    97.6 F (36.4 C)  TempSrc:    Oral  Resp: 18 14    Height:      Weight:      SpO2: 99% 98% 97%      MDM   Meds given in ED:  Medications  iohexol (OMNIPAQUE) 300 MG/ML solution 80 mL (80 mLs Intravenous Contrast Given 12/27/14 2033)    Discharge Medication List as of 12/27/2014 10:19 PM      Final diagnoses:  Right groin pain  Prostate cancer metastatic to bone   This is a 81 y.o. male who is wheelchair bound with a history of aortic valve replacement, hypertension, GERD, coronary artery disease, and arthritis, who presents to the ED with his wife  complaining of right hip pain and bilateral low back pain for the past 4 days that is worse at night. The patient is afebrile and nontoxic-appearing. In my evaluation the patient denies any pain reports only has pain with movement. Patient has palpable mass about 2 cm in size, in his right inguinal region. This does not feel like a hernia. It is tender to palpation. He has full ROM of his right hip and exhibits good strength. Urinalysis is negative for infection. Patient's CBC and CMP are unremarkable. Patient CT abdomen and pelvis with contrast indicated an increase in sclerotic bone lesions consistent with metastatic disease. The patient has a history of prostate cancer with  mets to bone. This is is cause of his pain. I suggested tylenol for pain, as the patient does not like taking pain medications. I discussed the maximum daily dose of tylenol. I advised he follow up with his oncologist as well as his family physician. I advised the patient to return to the emergency department with new or worsening symptoms or new concerns. The patient and his wife verbalized understanding and agreement with plan.   This patient was discussed with and evaluated by Dr. Eulis Foster who agrees with assessment and plan.       Hanley Hays, PA-C 12/28/14 0122  Richarda Blade, MD 12/28/14 [phone]

## 2014-12-27 NOTE — ED Notes (Signed)
Pt undressed, in gown, on continuous pulse oximetry and blood pressure cuff; visitor at bedside

## 2014-12-27 NOTE — Discharge Instructions (Signed)
Hip Pain Your hip is the joint between your upper legs and your lower pelvis. The bones, cartilage, tendons, and muscles of your hip joint perform a lot of work each day supporting your body weight and allowing you to move around. Hip pain can range from a minor ache to severe pain in one or both of your hips. Pain may be felt on the inside of the hip joint near the groin, or the outside near the buttocks and upper thigh. You may have swelling or stiffness as well.  HOME CARE INSTRUCTIONS   Take medicines only as directed by your health care provider.  Apply ice to the injured area:  Put ice in a plastic bag.  Place a towel between your skin and the bag.  Leave the ice on for 15-20 minutes at a time, 3-4 times a day.  Keep your leg raised (elevated) when possible to lessen swelling.  Avoid activities that cause pain.  Follow specific exercises as directed by your health care provider.  Sleep with a pillow between your legs on your most comfortable side.  Record how often you have hip pain, the location of the pain, and what it feels like. SEEK MEDICAL CARE IF:   You are unable to put weight on your leg.  Your hip is red or swollen or very tender to touch.  Your pain or swelling continues or worsens after 1 week.  You have increasing difficulty walking.  You have a fever. SEEK IMMEDIATE MEDICAL CARE IF:   You have fallen.  You have a sudden increase in pain and swelling in your hip. MAKE SURE YOU:   Understand these instructions.  Will watch your condition.  Will get help right away if you are not doing well or get worse. Document Released: 04/15/2010 Document Revised: 03/12/2014 Document Reviewed: 06/22/2013 [facility] Patient Information 2015 [place], [place]. This information is not intended to replace advice given to you by your health care provider. Make sure you discuss any questions you have with your health care provider. Bone Metastases Cancerous growths can  begin in any part of the body. The original site of cancer is called the primary tumor or primary cancer (for example, breast cancer). After cancer has developed in one area of the body, cancerous cells from that area can break away and travel through the body's bloodstream. If these cancerous cells begin growing in another place in the body, they are called metastases. Bone metastases are cancer cells that have spread to the bone (which is different from a cancer that starts in the bone). These secondary growths are like the original tumor. For example, if a prostate cancer spreads to bone it is called metastatic prostate cancer, or prostate cancer metastatic to bone, but not bone cancer. Cancers can spread to almost any bone; the spine and pelvis are often involved.  Any type of cancer can spread to the bone, but the most common are breast, lung, kidney, thyroid and prostate cancers. Sometimes the primary tumor is not discovered until there are bone problems. If the primary cancer location cannot be discovered, the cancer is called cancer of unknown primary location. SYMPTOMS  Pain in the bones is the main symptom of bone metastases. Some other problems may occur first including:  Decreased appetite.  Nausea.  Muscle weakness.  Confusion.  Unusual sleep patterns due to discomfort.  Overly tired (fatigue).  Restlessness. Frail or brittle bones may lead to broken bones (fractures) that lead to learning what is wrong (diagnosis).  A tumor often weakens the bones.  DIAGNOSIS  Metastatic cancers may be found months or years after or at the same time as the primary tumor. When a second tumor is found in a patient who has been treated for cancer, it is more often a metastasis than another primary tumor.  The patient's symptoms, physical examination, X-rays and blood tests may suggest a bone metastases. In addition, an examination of tissue or a cell sample (biopsy) is usually done to find the cancer.  This sample is removed with a needle. This tissue sample must be looked at under a microscope to confirm a diagnosis. TREATMENT  Options generally include treatments that give relief from symptoms (palliative) or curative. Those with advanced, metastasized cancer may receive treatment focused on pain relief and prolonging life. These treatments depend on the type of cancer and its location.  Treatment for cancer depends on its type and location. Some of these treatments are:  Surgery to remove the original tumor and/or to remove parts of the body that produce hormones and other chemicals that make cancer worse.  Treatment with drugs (chemotherapy).  Bone marrow transplantations on rare occasions.  Radiation therapy (radiotherapy).  Hormonal therapy.  Pain relieving medications. Your caregiver will help you understand the likelihood that any particular treatment will be helpful for you. While some treatments aim to cure or control the cancer, others give relief from symptoms only. If you have bone metastases, radiation therapy may be recommended to treat pain (if it is in one main location). Pain medications are available. These include strong medicines like morphine. You may be instructed to take a long-acting pain medication (to control most of your pain) and a short-acting medication to control occasional flares of pain. Pain medication is sometimes also given continuously through a pump. HOME CARE INSTRUCTIONS   Take medications exactly as prescribed.  Keep any follow-up appointments.  Pain medications can make you sleepy or confused. Do not drive, climb ladders, or do other dangerous activities while on pain medication.  Pain medications often cause constipation. Ask your caregiver for information on stool softeners.  Do not share your pain medication with others. SEEK MEDICAL CARE IF:   Your bone pain is not controlled.  You are having problems or side effects from your  medication.  You have excessive sleepiness or confusion. SEEK IMMEDIATE MEDICAL CARE IF:   You fall and have any injury or pain from the fall.  You have trouble walking.  You have numbness or tingling in your legs.  You develop a sudden significant worsening of your pain. Document Released: 10/16/2002 Document Revised: 01/18/2012 Document Reviewed: 06/08/2008 [facility] Patient Information 2015 [place], [place]. This information is not intended to replace advice given to you by your health care provider. Make sure you discuss any questions you have with your health care provider.

## 2015-01-02 ENCOUNTER — Telehealth: Payer: Self-pay | Admitting: Oncology

## 2015-01-02 NOTE — Telephone Encounter (Signed)
S/w pt's wife confirming time change per MD... KJ

## 2015-01-08 ENCOUNTER — Ambulatory Visit (HOSPITAL_BASED_OUTPATIENT_CLINIC_OR_DEPARTMENT_OTHER): Payer: Medicare HMO | Admitting: Oncology

## 2015-01-08 VITALS — BP 147/88 | HR 57 | Temp 97.7°F | Resp 18 | Ht 68.0 in

## 2015-01-08 DIAGNOSIS — C61 Malignant neoplasm of prostate: Secondary | ICD-10-CM

## 2015-01-08 DIAGNOSIS — C7951 Secondary malignant neoplasm of bone: Secondary | ICD-10-CM

## 2015-01-08 DIAGNOSIS — M84459D Pathological fracture, hip, unspecified, subsequent encounter for fracture with routine healing: Secondary | ICD-10-CM

## 2015-01-08 DIAGNOSIS — I2510 Atherosclerotic heart disease of native coronary artery without angina pectoris: Secondary | ICD-10-CM

## 2015-01-08 NOTE — Progress Notes (Signed)
[facility] PROGRESS NOTE DATE OF VISIT: 08/30/2014  Clayton [name], MD [place] [id number]  DIAGNOSIS: 81 year old  gentleman with hormone sensitive metastatic prostate cancer diagnosed in June 2015. He presented with bony metastasis and a PSA of 70.23.   CURRENT TREATMENT:   Lupron 22.5 mg q 3 months started on 06/05/2014. Next injection planned for April 2016. Completed one month of casodex 50 mg daily on 06/29/2014.  Xgeva given in September 2015.  INTERVAL HISTORY: [name] presents today for a follow-up visit. Since the last visit, he was seen in the emergency department for increased pain. His evaluation on 12/27/2014 included a CT scan of the abdomen and pelvis. There was no acute abnormalities but did show sclerotic bony lesions. He was asked to follow-up regarding these issues. Since that episode, he does not report any increased pain. He has a very limited performance status and for the most part is wheelchair-bound. He is also blind and have very crippling arthritis. His quality of life very limited but unchanged. Has not reported any other pain in the back, hips and shoulders or any bone areas.  He tolerated Lupron with increased hot flashes. He also reported some insomnia.  He does not report any headaches or blurry vision or syncope. He does not report any fevers, chills, sweats or weight loss. He does not report any chest pain, palpitation orthopnea or dyspnea on exertion. He does not report any cough or hemoptysis or wheezing. Does not report any nausea, vomiting, abdominal pain. He does not report any constipation or diarrhea. Does not report any hematuria but does report nocturia and frequency. Rest of his review of systems unremarkable.  MEDICAL HISTORY: Past Medical History  Diagnosis Date  . CAD (coronary artery disease)     s/p CABG 1999  . HLD (hyperlipidemia)   . HTN (hypertension)   . GERD (gastroesophageal reflux  disease)   . H/O hiatal hernia   . Arthritis   . Peripheral vascular disease     aneursym  . Aortic aneurysm     at least 3 cmm infrarenal AAA by lumbar CT 05/22/13 [facility]); 41 mm proximal ascending aorta on echo 05/05/13 [facility])     ALLERGIES:  is allergic to tetanus toxoids.  MEDICATIONS: has a current medication list which includes the following prescription(s): aspirin ec, cyanocobalamin, denosumab, donepezil, dorzolamide-timolol, latanoprost, leuprolide, lisinopril, meclizine, metoprolol, metoprolol, multivitamin-lutein, omeprazole, tamsulosin, warfarin, warfarin, and warfarin.  ECOG PERFORMANCE STATUS:  3  Blood pressure 147/88, pulse 57, temperature 97.7 F (36.5 C), temperature source Oral, resp. rate 18, height 5\' 8"  (1.727 m), weight 0 lb (0 kg), SpO2 99 %.  GENERAL:alert, no distress and comfortable; chronically ill appearing. In a wheelchair today. SKIN: No rashes or significant lesions  EYES: normal, Conjunctiva are pink and non-injected, sclera clear  OROPHARYNX:no exudate, no erythema and lips, buccal mucosa, and tongue normal  NECK: supple, thyroid normal size, non-tender. No lymphadenopathy noted. LYMPH: no palpable lymphadenopathy in the cervical, axillary or inguinal  LUNGS: clear to auscultation and percussion with normal breathing effort  HEART: regular rate & rhythm and no murmurs and no lower extremity edema  ABDOMEN:abdomen soft, non-tender and normal bowel sounds  Musculoskeletal:no cyanosis of digits and no clubbing  NEURO: No focal deficits noted.  Labs:  Lab Results  Component Value Date   WBC 6.5 12/27/2014   HGB 12.8* 12/27/2014   HCT 39.8 12/27/2014   MCV 93.4 12/27/2014   PLT 172  12/27/2014   NEUTROABS 3.9 12/27/2014      Chemistry      Component Value Date/Time   NA 138 12/27/2014 1600   NA 139 11/23/2014 0827   K 4.4 12/27/2014 1600   K 4.2 11/23/2014 0827   CL 104 12/27/2014 1600   CO2 29 12/27/2014 1600   CO2 26  11/23/2014 0827   BUN 25* 12/27/2014 1600   BUN 23.7 11/23/2014 0827   CREATININE 1.20 12/27/2014 1600   CREATININE 1.1 11/23/2014 0827      Component Value Date/Time   CALCIUM 8.5 12/27/2014 1600   CALCIUM 8.0* 11/23/2014 0827   ALKPHOS 79 12/27/2014 1600   ALKPHOS 98 11/23/2014 0827   AST 29 12/27/2014 1600   AST 22 11/23/2014 0827   ALT 22 12/27/2014 1600   ALT 14 11/23/2014 0827   BILITOT 0.9 12/27/2014 1600   BILITOT 0.54 11/23/2014 0827      Results for Clayton, [name] (MRN [id number]) as of 01/08/2015 15:40  Ref. Range 05/06/2014 22:15 06/05/2014 09:59 07/05/2014 08:53 08/02/2014 13:56 08/30/2014 08:19 09/28/2014 09:01 11/23/2014 08:27  PSA Latest Range: <=4.00 ng/mL 70.23 (H) 60.63 (H) 10.66 (H) 5.67 (H) 4.61 (H) 4.79 (H) 3.21       ASSESSMENT AND PLAN : [name] 81 y.o. male with:    1. Metastatic Prostate cancer. His disease appears to be hormone sensitive with PSA dropping from 70.23 down to 4.6. His last PSA in 11/2014 is down to 3.21. He is currently on Lupron only and have tolerated it reasonably well. The plan is to continue with the current regimen and repeat Lupron every 3 months. If his PSA rises in the future, we can consider adding Casodex. His CT scan in February 2016 was reviewed today and I see no evidence to suggest disease progression. He I reviewed those imaging studies with the family and clearly he has had metastatic disease from the time of diagnosis in June 2015. The fact that his PSA is dropping is a indicating that the current treatment is working. I reiterated the fact that he still has a poor prognosis and has an incurable malignancy. The family's understanding in having hospice involved and his prior care physician have arranged for that to happen. I have no objection at this time along as we continue Lupron as it offers significant palliation of symptoms.  2. Pathologic R. Hip fracture(avulsion, less trochanteric) s/p R ORIF (05/10/2014)   3. Bone directed  therapy:  He continues to be on Xgeva which I will increase in frequency every 3 months. It will be a lot easier for him to come to clinic every 3 months versus monthly basis.  4. AVR s/p mechanical valve replacement.  Anticoagulation resumed.  5. CAD/ CABG  He is on metoprolol 25 mg bid and aspirin.   6. Diastolic CHF/HTN/HLD - follows up with his primary care physician and cardiologist. regarding this issue.   7. Normocytic anemia likely multifactorial in nature due to malignancy and chronic disease. We'll continue to monitor this.  8. Follow-up: Will be 02/22/2015 for clinical visit and Lupron injection. His follow-up will be after that in 3 months with an injection appointment.   [facility], MD 01/08/2015

## 2015-01-09 ENCOUNTER — Telehealth: Payer: Self-pay | Admitting: *Deleted

## 2015-01-09 NOTE — Telephone Encounter (Signed)
FAXED 01/08/15 DICTATION TO [facility].

## 2015-02-18 ENCOUNTER — Encounter: Payer: Self-pay | Admitting: Cardiovascular Disease

## 2015-02-18 ENCOUNTER — Ambulatory Visit (INDEPENDENT_AMBULATORY_CARE_PROVIDER_SITE_OTHER): Payer: Medicare HMO | Admitting: Cardiovascular Disease

## 2015-02-18 VITALS — BP 120/62 | HR 54 | Ht 68.0 in | Wt 201.0 lb

## 2015-02-18 DIAGNOSIS — I10 Essential (primary) hypertension: Secondary | ICD-10-CM

## 2015-02-18 DIAGNOSIS — I359 Nonrheumatic aortic valve disorder, unspecified: Secondary | ICD-10-CM | POA: Diagnosis not present

## 2015-02-18 NOTE — Patient Instructions (Signed)
Your physician wants you to follow-up in: 6 MONTHS with Dr Cooper.  You will receive a reminder letter in the mail two months in advance. If you don't receive a letter, please call our office to schedule the follow-up appointment.  Your physician recommends that you continue on your current medications as directed. Please refer to the Current Medication list given to you today.  

## 2015-02-18 NOTE — Progress Notes (Signed)
Cardiology Office Note   Date:  02/18/2015   ID:  Clayton [name], DOB 19-May-1926, MRN [id number]  PCP:  [name], MD  Cardiologist:  [name], MD    Chief Complaint  Patient presents with  . Appointment    ROV     History of Present Illness: Clayton [name] is a 81 y.o. male who presents for follow-up of coronary and aortic valve disease. He underwent CABG/AVR in 1999 and was treated with a [employer] mechanical prosthesis. Valve has functioned normally on serial echo testing. The patient has tolerated long-term warfarin.  His health continues to decline. He is now completely blind. He doesn't walk on his own at all and he is completely dependent on his wife who is with him today. He's also been diagnosed with metastatic prostate cancer.   He denies chest pain, dyspnea, or edema. Has had no bleeding problems.    Past Medical History  Diagnosis Date  . CAD (coronary artery disease)     s/p CABG 1999  . HLD (hyperlipidemia)   . HTN (hypertension)   . GERD (gastroesophageal reflux disease)   . H/O hiatal hernia   . Arthritis   . Peripheral vascular disease     aneursym  . Aortic aneurysm     at least 3 cmm infrarenal AAA by lumbar CT 05/22/13 [facility]); 41 mm proximal ascending aorta on echo 05/05/13 [facility])    Past Surgical History  Procedure Laterality Date  . Aortic valve replacement  1998  . Coronary artery bypass graft  1998  . Cholecystectomy    . Tonsillectomy    . US extremity*l* Left     arm  . Inguinal hernia repair  09/25/2013    with mesh  . Inguinal hernia repair Right 09/25/2013    Procedure: HERNIA REPAIR INGUINAL ADULT;  Surgeon: Imogene Burn. Georgette Dover, MD;  Location: [place];  Service: General;  Laterality: Right;  . Insertion of mesh Right 09/25/2013    Procedure: INSERTION OF MESH;  Surgeon: Imogene Burn. Georgette Dover, MD;  Location: [place];  Service: General;  Laterality: Right;  . Hand surgery Left     MULTIPLE   . Intramedullary (im) nail  intertrochanteric Right 05/10/2014    Procedure: Open Reduction Internal Fixation Right Hip;  Surgeon: Mcarthur Rossetti, MD;  Location: [place];  Service: Orthopedics;  Laterality: Right;    Current Outpatient Prescriptions  Medication Sig Dispense Refill  . aspirin EC 81 MG tablet Take 81 mg by mouth daily.    . cyanocobalamin (,VITAMIN B-12,) 1000 MCG/ML injection Inject 1,000 mcg into the muscle See admin instructions. 1000 mcg IM daily for 5 days (received 7/7-7/11), then weekly for 3 weeks (first dose due 7/18), then monthly    . denosumab (XGEVA) 120 MG/1.7ML SOLN injection Inject 120 mg into the skin once.    . donepezil (ARICEPT) 5 MG tablet Take 5 mg by mouth at bedtime.     . dorzolamide-timolol (COSOPT) 22.3-6.8 MG/ML ophthalmic solution Place 1 drop into both eyes 2 (two) times daily.     Marland Kitchen latanoprost (XALATAN) 0.005 % ophthalmic solution Place 1 drop into both eyes at bedtime.     Marland Kitchen leuprolide (LUPRON) 22.5 MG injection Inject 22.5 mg into the muscle every 3 (three) months.    Marland Kitchen lisinopril (PRINIVIL,ZESTRIL) 10 MG tablet Take 1 tablet by mouth daily.    . meclizine (ANTIVERT) 25 MG tablet Take 25 mg by mouth daily as needed for dizziness or nausea.     Marland Kitchen  metoprolol (LOPRESSOR) 100 MG tablet Take 100 mg by mouth daily.     . metoprolol (LOPRESSOR) 50 MG tablet Take 50 mg by mouth at bedtime.    . multivitamin-lutein (OCUVITE-LUTEIN) CAPS Take 1 capsule by mouth 2 (two) times daily.      Marland Kitchen omeprazole (PRILOSEC) 20 MG capsule Take 20 mg by mouth daily.     . Tamsulosin HCl (FLOMAX) 0.4 MG CAPS Take 0.4 mg by mouth at bedtime.     Marland Kitchen warfarin (COUMADIN) 1 MG tablet Take 7 mg by mouth daily. Pt takes  5 mg and 2 1 mg tablets to equal a total of 7 mg on Sunday Thursday Saturday    . warfarin (COUMADIN) 5 MG tablet Take 5-7 mg by mouth daily. Patient uses 5 mg tablet and 2 of the 1 mg to equal a total dose of 7 mg on Sunday Thursday Saturday. Rest of the days patient takes 5 mg.    .  warfarin (COUMADIN) 6 MG tablet 5 mg. Take 5 mg daily except 7mg  on Thursday and Sunday.     No current facility-administered medications for this visit.    Allergies:   Tetanus toxoids   Social History:  The patient  reports that he has quit smoking. He has never used smokeless tobacco. He reports that he does not drink alcohol or use illicit drugs.   Family History:  The patient's  family history includes Coronary artery disease in an other family member; Heart attack in his brother.    PHYSICAL EXAM: VS:  BP 120/62 mmHg  Pulse 54  Ht 5\' 8"  (1.727 m)  Wt 201 lb (91.173 kg)  BMI 30.57 kg/m2 , BMI Body mass index is 30.57 kg/(m^2). GEN: elderly male in wheelchair, in no acute distress HEENT: normal except keeps eyes closed during evaluation today Neck: no JVD, no masses. No carotid bruits Cardiac: RRR with 2/6 systolic murmur at RUSB and normal mechanical S2                Respiratory:  clear to auscultation bilaterally, normal work of breathing GI: soft, nontender, nondistended, + BS MS: no deformity or atrophy Ext: no pretibial edema Skin: warm and dry, no rash Neuro:  Strength and sensation are intact Psych: euthymic mood, full affect  EKG:  EKG is ordered today. The ekg ordered today shows Sinus bradycardia 54 bpm, LAD, minimal criteria for LVH, nonspecific T wave changes  Recent Labs: 05/22/2014: Pro B Natriuretic peptide (BNP) 3017.0* 12/27/2014: ALT 22; BUN 25*; Creatinine 1.20; Hemoglobin 12.8*; Platelets 172; Potassium 4.4; Sodium 138   Lipid Panel  No results found for: CHOL, TRIG, HDL, CHOLHDL, VLDL, LDLCALC, LDLDIRECT    Wt Readings from Last 3 Encounters:  02/18/15 201 lb (91.173 kg)  12/27/14 190 lb (86.183 kg)  10/26/14 195 lb 8 oz (88.678 kg)     Cardiac Studies Reviewed: Echo 05/05/2013: Study Conclusions  - Left ventricle: The cavity size was normal. Wall thickness was normal. Systolic function was normal. The estimated ejection fraction was in  the range of 60% to 65%. Doppler parameters are consistent with abnormal left ventricular relaxation (grade 1 diastolic dysfunction). - Aortic valve: AV prosthesis is difficult to see well Peak and mean gradients through the valve are 14 and 6 mm Hg respectively.  ASSESSMENT AND PLAN: 1.  Aortic valve disease s/p mechanical AVR - normal valve function by last echo and clinical exam. Tolerates long-term warfarin.  2. CAD s/p CABG: no sx's of angina. Continue  current med Rx.   3. HTN, essential: BP controlled on current Rx.    Current medicines are reviewed with the patient today.  The patient does not have concerns regarding medicines.  The following changes have been made:   no change  Labs/ tests ordered today include:  No orders of the defined types were placed in this encounter.   Disposition:   FU 6 months  Signed, [name], MD  02/18/2015 2:30 PM    [address], [place], [place]  [address] Phone: [phone]; Fax: [phone]

## 2015-02-19 ENCOUNTER — Encounter: Payer: Self-pay | Admitting: Cardiovascular Disease

## 2015-02-22 ENCOUNTER — Ambulatory Visit (HOSPITAL_BASED_OUTPATIENT_CLINIC_OR_DEPARTMENT_OTHER): Payer: Medicare HMO | Admitting: Physician Assistant

## 2015-02-22 ENCOUNTER — Other Ambulatory Visit (HOSPITAL_COMMUNITY)
Admission: RE | Admit: 2015-02-22 | Discharge: 2015-02-22 | Disposition: A | Discharge status: Home / Self Care | Payer: Medicare HMO | Source: Ambulatory Visit | Attending: Oncology | Admitting: Oncology

## 2015-02-22 ENCOUNTER — Encounter: Payer: Self-pay | Admitting: Physician Assistant

## 2015-02-22 ENCOUNTER — Other Ambulatory Visit (HOSPITAL_BASED_OUTPATIENT_CLINIC_OR_DEPARTMENT_OTHER): Payer: Medicare HMO

## 2015-02-22 ENCOUNTER — Telehealth: Payer: Self-pay | Admitting: Physician Assistant

## 2015-02-22 ENCOUNTER — Ambulatory Visit (HOSPITAL_BASED_OUTPATIENT_CLINIC_OR_DEPARTMENT_OTHER): Payer: Medicare HMO

## 2015-02-22 VITALS — BP 150/64 | HR 53 | Temp 98.0°F | Resp 18 | Ht 68.0 in | Wt 201.1 lb

## 2015-02-22 DIAGNOSIS — C61 Malignant neoplasm of prostate: Secondary | ICD-10-CM

## 2015-02-22 DIAGNOSIS — D630 Anemia in neoplastic disease: Secondary | ICD-10-CM

## 2015-02-22 DIAGNOSIS — Z5111 Encounter for antineoplastic chemotherapy: Secondary | ICD-10-CM | POA: Diagnosis not present

## 2015-02-22 DIAGNOSIS — C7951 Secondary malignant neoplasm of bone: Secondary | ICD-10-CM

## 2015-02-22 DIAGNOSIS — E538 Deficiency of other specified B group vitamins: Secondary | ICD-10-CM | POA: Insufficient documentation

## 2015-02-22 LAB — CBC WITH DIFFERENTIAL/PLATELET
BASO%: 0.6 % (ref 0.0–2.0)
BASOS ABS: 0 10*3/uL (ref 0.0–0.1)
EOS ABS: 0.3 10*3/uL (ref 0.0–0.5)
EOS%: 3.5 % (ref 0.0–7.0)
HCT: 38.2 % — ABNORMAL LOW (ref 38.4–49.9)
HGB: 12.5 g/dL — ABNORMAL LOW (ref 13.0–17.1)
LYMPH#: 1.7 10*3/uL (ref 0.9–3.3)
LYMPH%: 22.5 % (ref 14.0–49.0)
MCH: 30.4 pg (ref 27.2–33.4)
MCHC: 32.7 g/dL (ref 32.0–36.0)
MCV: 93.1 fL (ref 79.3–98.0)
MONO#: 0.7 10*3/uL (ref 0.1–0.9)
MONO%: 8.8 % (ref 0.0–14.0)
NEUT#: 4.9 10*3/uL (ref 1.5–6.5)
NEUT%: 64.6 % (ref 39.0–75.0)
Platelets: 179 10*3/uL (ref 140–400)
RBC: 4.1 10*6/uL — AB (ref 4.20–5.82)
RDW: 14.5 % (ref 11.0–14.6)
WBC: 7.7 10*3/uL (ref 4.0–10.3)

## 2015-02-22 LAB — COMPREHENSIVE METABOLIC PANEL
ALBUMIN: 3.8 g/dL (ref 3.5–5.2)
ALT: 17 U/L (ref 0–53)
AST: 22 U/L (ref 0–37)
Alkaline Phosphatase: 66 U/L (ref 39–117)
Anion gap: 7 (ref 5–15)
BILIRUBIN TOTAL: 0.6 mg/dL (ref 0.3–1.2)
BUN: 25 mg/dL — AB (ref 6–23)
CHLORIDE: 103 mmol/L (ref 96–112)
CO2: 28 mmol/L (ref 19–32)
Calcium: 8.3 mg/dL — ABNORMAL LOW (ref 8.4–10.5)
Creatinine, Ser: 1.22 mg/dL (ref 0.50–1.35)
GFR, EST AFRICAN AMERICAN: 60 mL/min — AB (ref >90)
GFR, EST NON AFRICAN AMERICAN: 52 mL/min — AB (ref >90)
GLUCOSE: 99 mg/dL (ref 70–99)
Potassium: 4.4 mmol/L (ref 3.5–5.1)
SODIUM: 138 mmol/L (ref 135–145)
TOTAL PROTEIN: 6.9 g/dL (ref 6.0–8.3)

## 2015-02-22 MED ORDER — LEUPROLIDE ACETATE (3 MONTH) 22.5 MG IM KIT
22.5000 mg | PACK | Freq: Once | INTRAMUSCULAR | Status: AC
  Administered 2015-02-22: 22.5 mg via INTRAMUSCULAR
  Filled 2015-02-22: qty 22.5

## 2015-02-22 MED ORDER — DENOSUMAB 120 MG/1.7ML ~~LOC~~ SOLN
120.0000 mg | Freq: Once | SUBCUTANEOUS | Status: AC
  Administered 2015-02-22: 120 mg via SUBCUTANEOUS
  Filled 2015-02-22: qty 1.7

## 2015-02-22 NOTE — Progress Notes (Signed)
Discussed Calcium with son.  Told him that Greco needed to be taking Calcium twice a day.

## 2015-02-22 NOTE — Telephone Encounter (Signed)
appointments made and avs printed for patient °

## 2015-02-22 NOTE — Progress Notes (Signed)
[name] OFF[facility] PROGRESS NOTE DATE OF VISIT: 08/30/2014  Clayton [name], MD [place] [address]  DIAGNOSIS: 81 year old  gentleman with hormone sensitive metastatic prostate cancer diagnosed in June 2015. He presented with bony metastasis and a PSA of 70.23.   CURRENT TREATMENT:   Lupron 22.5 mg q 3 months started on 06/05/2014. Next injection planned for April 2016. Completed one month of casodex 50 mg daily on 06/29/2014.  Xgeva given in September 2015.  INTERVAL HISTORY: Clayton [name] presents today for a follow-up visit. Since the last visit, he was seen in the emergency department for increased pain. His evaluation on 12/27/2014 included a CT scan of the abdomen and pelvis. There were no acute abnormalities but did show sclerotic bony lesions. He was asked to follow-up regarding these issues. Since that episode, he does not report any increased pain. He continues to have very limited performance status and for the most part is wheelchair-bound. He is also blind and has very crippling arthritis. His quality of life is  very limited but unchanged. He does not reported any other pain in the back, hips and shoulders or any bone areas.  He tolerated Lupron with increased hot flashes. He also reported some insomnia.  He does not report any headaches or blurry vision or syncope. He does not report any fevers, chills, sweats or weight loss. He does not report any chest pain, palpitation orthopnea or dyspnea on exertion. He does not report any cough or hemoptysis or wheezing. Does not report any nausea, vomiting, abdominal pain. He does not report any constipation or diarrhea. Does not report any hematuria but does report nocturia and frequency. Remainder of his review of systems unremarkable.  MEDICAL HISTORY: Past Medical History  Diagnosis Date  . CAD (coronary artery disease)     s/p CABG 1999  . HLD (hyperlipidemia)   . HTN (hypertension)   . GERD  (gastroesophageal reflux disease)   . H/O hiatal hernia   . Arthritis   . Peripheral vascular disease     aneursym  . Aortic aneurysm     at least 3 cmm infrarenal AAA by lumbar CT 05/22/13 [facility]); 41 mm proximal ascending aorta on echo 05/05/13 [facility])     ALLERGIES:  is allergic to tetanus toxoids.  MEDICATIONS: has a current medication list which includes the following prescription(s): aspirin ec, cyanocobalamin, denosumab, donepezil, dorzolamide-timolol, latanoprost, leuprolide, lisinopril, metoprolol, metoprolol, multivitamin-lutein, omeprazole, tamsulosin, warfarin, warfarin, warfarin, and meclizine, and the following Facility-Administered Medications: denosumab.  ECOG PERFORMANCE STATUS:  3  Blood pressure 150/64, pulse 53, temperature 98 F (36.7 C), temperature source Oral, resp. rate 18, height 5\' 8"  (1.727 m), weight 201 lb 1.6 oz (91.218 kg), SpO2 97 %.  GENERAL:alert, no distress and comfortable; chronically ill appearing. In a wheelchair today. SKIN: No rashes or significant lesions  EYES: normal, Conjunctiva are pink and non-injected, sclera clear  OROPHARYNX:no exudate, no erythema and lips, buccal mucosa, and tongue normal  NECK: supple, thyroid normal size, non-tender. No lymphadenopathy noted. LYMPH: no palpable lymphadenopathy in the cervical, axillary or inguinal  LUNGS: clear to auscultation and percussion with normal breathing effort  HEART: regular rate & rhythm and no murmurs and no lower extremity edema  ABDOMEN:abdomen soft, non-tender and normal bowel sounds  Musculoskeletal:no cyanosis of digits and no clubbing  NEURO: No focal deficits noted.  Labs:  Lab Results  Component Value Date   WBC 7.7 02/22/2015   HGB 12.5* 02/22/2015  HCT 38.2* 02/22/2015   MCV 93.1 02/22/2015   PLT 179 02/22/2015   NEUTROABS 4.9 02/22/2015      Chemistry      Component Value Date/Time   NA 138 02/22/2015 0927   NA 139 11/23/2014 0827   K 4.4  02/22/2015 0927   K 4.2 11/23/2014 0827   CL 103 02/22/2015 0927   CO2 28 02/22/2015 0927   CO2 26 11/23/2014 0827   BUN 25* 02/22/2015 0927   BUN 23.7 11/23/2014 0827   CREATININE 1.22 02/22/2015 0927   CREATININE 1.1 11/23/2014 0827      Component Value Date/Time   CALCIUM 8.3* 02/22/2015 0927   CALCIUM 8.0* 11/23/2014 0827   ALKPHOS 66 02/22/2015 0927   ALKPHOS 98 11/23/2014 0827   AST 22 02/22/2015 0927   AST 22 11/23/2014 0827   ALT 17 02/22/2015 0927   ALT 14 11/23/2014 0827   BILITOT 0.6 02/22/2015 0927   BILITOT 0.54 11/23/2014 0827      Results for Clayton [name] (MRN [id number]) as of 01/08/2015 15:40  Ref. Range 05/06/2014 22:15 06/05/2014 09:59 07/05/2014 08:53 08/02/2014 13:56 08/30/2014 08:19 09/28/2014 09:01 11/23/2014 08:27  PSA Latest Range: <=4.00 ng/mL 70.23 (H) 60.63 (H) 10.66 (H) 5.67 (H) 4.61 (H) 4.79 (H) 3.21    PSA on 11/23/2014 was 3.21   ASSESSMENT AND PLAN : Clayton [name] 81 y.o. male with:    1. Metastatic Prostate cancer. His disease appears to be hormone sensitive with PSA dropping from 70.23 down to 4.6. His last PSA in 11/2014 is down to 3.21. He is currently on Lupron only and has tolerated it reasonably well. The plan is to continue with the current regimen and repeat Lupron every 3 months. If his PSA rises in the future, we can consider adding Casodex. His CT scan in February 2016 was reviewed today and I see no evidence to suggest disease progression. He I reviewed those imaging studies with the family and clearly he has had metastatic disease from the time of diagnosis in June 2015. The fact that his PSA is dropping is a indicating that the current treatment is working. I reiterated the fact that he still has a poor prognosis and has an incurable malignancy. The family's understanding in having hospice involved and his primarycare physician have arranged for that to happen.  We have no objection at this time as long as we continue Lupron as it offers  significant palliation of symptoms.  2. Pathologic R. Hip fracture(avulsion, less trochanteric) s/p R ORIF (05/10/2014)   3. Bone directed therapy:  He continues to be on Xgeva. Dr. Alen Blew increased the frequency to every 3 months. It will be a lot easier for him to come to clinic every 3 months versus monthly basis.  4. AVR s/p mechanical valve replacement.  Anticoagulation resumed.  5. CAD/ CABG  He is on metoprolol 25 mg bid and aspirin.   6. Diastolic CHF/HTN/HLD - follows up with his primary care physician and cardiologist. regarding this issue.   7. Normocytic anemia likely multifactorial in nature due to malignancy and chronic disease. We'll continue to monitor this.  8. Follow-up: Will be 05/24/2015 for clinical visit and Lupron injection. His follow-up will be after that in 3 months with an injection appointment.   Carlton Adam, PA-C  02/22/2015

## 2015-02-23 LAB — PSA: PSA: 1.68 ng/mL (ref <=4.00)

## 2015-02-24 NOTE — Patient Instructions (Signed)
Follow up in 3 months for Lupron and Xgeva injections

## 2015-03-22 ENCOUNTER — Telehealth: Payer: Self-pay | Admitting: *Deleted

## 2015-03-22 NOTE — Telephone Encounter (Signed)
Wife called [employer] for clarification of letter she received that Delton See would be shipped to her home and she prefers shipment to [facility].  Called [facility] because "[employer] does not have an order for Xgeva to ship anywhere.  Second letter reads Delton See approved 02-22-2015 through 05-23-2015 so we need to change the appointment."  Expressed that [facility] has a supplier for Xgeva and the medicine is given every three months.  Managed Care will obtain prior authorization before the next appointment.  She verbalized feeling better with this clarification.

## 2015-03-22 NOTE — Telephone Encounter (Signed)
PT.'S WIFE RECEIVED A LETTER WHICH STATES XGEVA PRIOR AUTHORIZATION IS THROUGH 05/23/15. INFORMED EBONY SMITH IN MANAGED CARE. SHE WILL CALL [employer] TO OBTAIN FURTHER AUTHORIZATIONS. NOTIFIED PT.'S WIFE.

## 2015-04-05 ENCOUNTER — Telehealth: Payer: Self-pay | Admitting: Oncology

## 2015-04-05 NOTE — Telephone Encounter (Signed)
Called and left a message with his new dr Alen Blew appointment

## 2015-04-18 ENCOUNTER — Telehealth: Payer: Self-pay | Admitting: Cardiovascular Disease

## 2015-04-18 MED ORDER — AMOXICILLIN 500 MG PO TABS: ORAL_TABLET | ORAL | Status: DC

## 2015-04-18 NOTE — Telephone Encounter (Signed)
I spoke with the pt's wife and she said the pt is suppose to take antibiotics prior to seeing the dentist but he does not have a Rx. The pt does have a history of AVR.  I gave the pt's wife instructions on taking antibiotic prior to appointment and she verbalized understanding. Rx sent to pharmacy.

## 2015-04-18 NOTE — Telephone Encounter (Signed)
New question:   Pt's wife called with questions on pt's medications before he see's the dentist.   Please give them a call back.

## 2015-05-06 ENCOUNTER — Telehealth: Payer: Self-pay | Admitting: Internal Medicine

## 2015-05-06 NOTE — Telephone Encounter (Signed)
Pt wife called for surgical clearance.. Advised to call dental office and have the rep complete the surgical clearance for the pt//sr

## 2015-05-07 ENCOUNTER — Telehealth: Payer: Self-pay | Admitting: Cardiovascular Disease

## 2015-05-07 NOTE — Telephone Encounter (Signed)
1. What dental office are you calling from? Dr. Marolyn Hammock  2. What is your office phone and fax number? Fax [phone]  3. What type of procedure is the patient having performed? Pt having tooth pulled  4. What date is procedure scheduled? Not scheduled yet  5. What is your question (ex. Antibiotics prior to procedure, holding medication-we need to know how long dentist wants pt to hold med)? Need pt to be off Warfarin for 2 days prior 6.

## 2015-05-09 NOTE — Telephone Encounter (Signed)
Pt has an AVR with no history of afib or CVA.  Okay to hold Coumadin x 2 days prior to procedure. Will fax to dental office.

## 2015-05-24 ENCOUNTER — Ambulatory Visit: Payer: Medicare HMO | Admitting: Oncology

## 2015-05-24 ENCOUNTER — Other Ambulatory Visit: Payer: Medicare HMO

## 2015-05-24 ENCOUNTER — Ambulatory Visit: Payer: Medicare HMO

## 2015-06-07 ENCOUNTER — Ambulatory Visit (HOSPITAL_BASED_OUTPATIENT_CLINIC_OR_DEPARTMENT_OTHER): Payer: Medicare HMO | Admitting: Oncology

## 2015-06-07 ENCOUNTER — Other Ambulatory Visit (HOSPITAL_BASED_OUTPATIENT_CLINIC_OR_DEPARTMENT_OTHER): Payer: Medicare HMO

## 2015-06-07 ENCOUNTER — Ambulatory Visit (HOSPITAL_BASED_OUTPATIENT_CLINIC_OR_DEPARTMENT_OTHER): Payer: Medicare HMO

## 2015-06-07 ENCOUNTER — Telehealth: Payer: Self-pay | Admitting: Oncology

## 2015-06-07 VITALS — BP 153/64 | HR 51 | Temp 98.1°F | Resp 17 | Ht 68.0 in

## 2015-06-07 DIAGNOSIS — I5030 Unspecified diastolic (congestive) heart failure: Secondary | ICD-10-CM | POA: Diagnosis not present

## 2015-06-07 DIAGNOSIS — C7951 Secondary malignant neoplasm of bone: Secondary | ICD-10-CM

## 2015-06-07 DIAGNOSIS — C61 Malignant neoplasm of prostate: Secondary | ICD-10-CM

## 2015-06-07 DIAGNOSIS — D649 Anemia, unspecified: Secondary | ICD-10-CM

## 2015-06-07 DIAGNOSIS — I10 Essential (primary) hypertension: Secondary | ICD-10-CM

## 2015-06-07 DIAGNOSIS — Z5111 Encounter for antineoplastic chemotherapy: Secondary | ICD-10-CM

## 2015-06-07 LAB — COMPREHENSIVE METABOLIC PANEL (CC13)
ALT: 21 U/L (ref 0–55)
AST: 23 U/L (ref 5–34)
Albumin: 3.5 g/dL (ref 3.5–5.0)
Alkaline Phosphatase: 79 U/L (ref 40–150)
Anion Gap: 7 mEq/L (ref 3–11)
BILIRUBIN TOTAL: 0.46 mg/dL (ref 0.20–1.20)
BUN: 24.1 mg/dL (ref 7.0–26.0)
CHLORIDE: 106 meq/L (ref 98–109)
CO2: 28 mEq/L (ref 22–29)
CREATININE: 1.3 mg/dL (ref 0.7–1.3)
Calcium: 8.9 mg/dL (ref 8.4–10.4)
EGFR: 50 mL/min/{1.73_m2} — AB (ref >90)
Glucose: 102 mg/dl (ref 70–140)
Potassium: 4.3 mEq/L (ref 3.5–5.1)
Sodium: 141 mEq/L (ref 136–145)
Total Protein: 6.6 g/dL (ref 6.4–8.3)

## 2015-06-07 LAB — CBC WITH DIFFERENTIAL/PLATELET
BASO%: 0.6 % (ref 0.0–2.0)
Basophils Absolute: 0 10*3/uL (ref 0.0–0.1)
EOS%: 3.1 % (ref 0.0–7.0)
Eosinophils Absolute: 0.3 10*3/uL (ref 0.0–0.5)
HEMATOCRIT: 37.6 % — AB (ref 38.4–49.9)
HGB: 12.4 g/dL — ABNORMAL LOW (ref 13.0–17.1)
LYMPH%: 20.7 % (ref 14.0–49.0)
MCH: 30.5 pg (ref 27.2–33.4)
MCHC: 33.1 g/dL (ref 32.0–36.0)
MCV: 92.3 fL (ref 79.3–98.0)
MONO#: 0.6 10*3/uL (ref 0.1–0.9)
MONO%: 7.6 % (ref 0.0–14.0)
NEUT%: 68 % (ref 39.0–75.0)
NEUTROS ABS: 5.7 10*3/uL (ref 1.5–6.5)
Platelets: 185 10*3/uL (ref 140–400)
RBC: 4.08 10*6/uL — AB (ref 4.20–5.82)
RDW: 13.4 % (ref 11.0–14.6)
WBC: 8.4 10*3/uL (ref 4.0–10.3)
lymph#: 1.7 10*3/uL (ref 0.9–3.3)

## 2015-06-07 MED ORDER — LEUPROLIDE ACETATE (3 MONTH) 22.5 MG IM KIT
22.5000 mg | PACK | Freq: Once | INTRAMUSCULAR | Status: AC
  Administered 2015-06-07: 22.5 mg via INTRAMUSCULAR
  Filled 2015-06-07: qty 22.5

## 2015-06-07 MED ORDER — DENOSUMAB 120 MG/1.7ML ~~LOC~~ SOLN
120.0000 mg | Freq: Once | SUBCUTANEOUS | Status: AC
  Administered 2015-06-07: 120 mg via SUBCUTANEOUS
  Filled 2015-06-07: qty 1.7

## 2015-06-07 NOTE — Telephone Encounter (Signed)
Pt confirmed labs/ov/inj per 07/29 POF, gave pt AVS and Calendar... KJ

## 2015-06-07 NOTE — Progress Notes (Signed)
Clayton [name], Clayton [name] [id number]  DIAGNOSIS: 81 year old  gentleman with hormone sensitive metastatic prostate cancer diagnosed in June 2015. He presented with bony metastasis and a PSA of 70.23.   CURRENT TREATMENT:   Lupron 22.5 mg q 3 months started on 06/05/2014. This will be given every 3 months. Completed one month of casodex 50 mg daily on 06/29/2014.  Xgeva given in September 2015. This will be given every 3 months as well.  INTERVAL HISTORY: [name] presents today for a follow-up visit with his family. Since the last visit, he continues to be about the same. He does report intermittent hip pain which have not changed dramatically. He did have back pain a few months ago which have subsided at this time. He has a very limited performance status and for the most part is wheelchair-bound. He is also blind and have crippling arthritis. His quality of life very limited but unchanged. Has not reported any other pain in the back, hips and shoulders or any bone areas. He reports no new complications related to Lupron at this time.  He does not report any headaches or blurry vision or syncope. He does not report any fevers, chills, sweats or weight loss. He does not report any chest pain, palpitation orthopnea or dyspnea on exertion. He does not report any cough or hemoptysis or wheezing. Does not report any nausea, vomiting, abdominal pain. He does not report any constipation or diarrhea. Does not report any hematuria but does report nocturia and frequency. Rest of his review of systems unremarkable.  MEDICAL HISTORY: Past Medical History  Diagnosis Date  . CAD (coronary artery disease)     s/p CABG 1999  . HLD (hyperlipidemia)   . HTN (hypertension)   . GERD (gastroesophageal reflux disease)   . H/O hiatal hernia   . Arthritis   . Peripheral vascular disease     aneursym  . Aortic aneurysm     at least 3  cmm infrarenal AAA by lumbar CT 05/22/13 [facility]); 41 mm proximal ascending aorta on echo 05/05/13 [facility])     ALLERGIES:  is allergic to tetanus toxoids.  MEDICATIONS:  Current Outpatient Prescriptions  Medication Sig Dispense Refill  . amoxicillin (AMOXIL) 500 MG tablet Take 4 tablets by mouth one hour prior to dental appointment 8 tablet 2  . aspirin EC 81 MG tablet Take 81 mg by mouth daily.    . cyanocobalamin (,VITAMIN B-12,) 1000 MCG/ML injection Inject 1,000 mcg into the muscle See admin instructions. 1000 mcg IM daily for 5 days (received 7/7-7/11), then weekly for 3 weeks (first dose due 7/18), then monthly    . denosumab (XGEVA) 120 MG/1.7ML SOLN injection Inject 120 mg into the skin once.    . donepezil (ARICEPT) 5 MG tablet Take 5 mg by mouth at bedtime.     . dorzolamide-timolol (COSOPT) 22.3-6.8 MG/ML ophthalmic solution Place 1 drop into both eyes 2 (two) times daily.     Marland Kitchen latanoprost (XALATAN) 0.005 % ophthalmic solution Place 1 drop into both eyes at bedtime.     Marland Kitchen leuprolide (LUPRON) 22.5 MG injection Inject 22.5 mg into the muscle every 3 (three) months.    Marland Kitchen lisinopril (PRINIVIL,ZESTRIL) 10 MG tablet Take 1 tablet by mouth daily.    . meclizine (ANTIVERT) 25 MG tablet Take 25 mg by mouth daily as needed for dizziness or nausea.     . metoprolol (LOPRESSOR)  100 MG tablet Take 100 mg by mouth daily.     . metoprolol (LOPRESSOR) 50 MG tablet Take 50 mg by mouth at bedtime.    . multivitamin-lutein (OCUVITE-LUTEIN) CAPS Take 1 capsule by mouth 2 (two) times daily.      Marland Kitchen omeprazole (PRILOSEC) 20 MG capsule Take 20 mg by mouth daily.     . Tamsulosin HCl (FLOMAX) 0.4 MG CAPS Take 0.4 mg by mouth at bedtime.     Marland Kitchen warfarin (COUMADIN) 1 MG tablet Take 7 mg by mouth daily. Pt takes  5 mg and 2 1 mg tablets to equal a total of 7 mg on Sunday Thursday Saturday    . warfarin (COUMADIN) 5 MG tablet Take 5-7 mg by mouth daily. Patient uses 5 mg tablet and 2 of the 1  mg to equal a total dose of 7 mg on Sunday Thursday Saturday. Rest of the days patient takes 5 mg.    . warfarin (COUMADIN) 6 MG tablet 5 mg. Take 5 mg daily except 7mg  on Thursday and Sunday.     No current facility-administered medications for this visit.    ECOG PERFORMANCE STATUS:  3  Blood pressure 153/64, pulse 51, temperature 98.1 F (36.7 C), temperature source Oral, resp. rate 17, height 5\' 8"  (1.727 m), SpO2 98 %.  GENERAL:chronically ill appearing gentleman. In a wheelchair today. SKIN: No rashes or significant lesions  EYES: normal, Conjunctiva are pink and non-injected, sclera clear  OROPHARYNX:no exudate, no erythema and lips. No oral thrush. NECK: supple, thyroid normal size, non-tender. No lymphadenopathy noted. LYMPH: no palpable lymphadenopathy in the cervical, axillary or inguinal  LUNGS: clear to auscultation and percussion with normal breathing effort  HEART: regular rate & rhythm and no murmurs and no lower extremity edema  ABDOMEN:abdomen soft, non-tender and normal bowel sounds  Musculoskeletal:no cyanosis of digits and no clubbing  NEURO: No focal deficits noted.  Labs:  Lab Results  Component Value Date   WBC 8.4 06/07/2015   HGB 12.4* 06/07/2015   HCT 37.6* 06/07/2015   MCV 92.3 06/07/2015   PLT 185 06/07/2015   NEUTROABS 5.7 06/07/2015      Chemistry      Component Value Date/Time   NA 138 02/22/2015 0927   NA 139 11/23/2014 0827   K 4.4 02/22/2015 0927   K 4.2 11/23/2014 0827   CL 103 02/22/2015 0927   CO2 28 02/22/2015 0927   CO2 26 11/23/2014 0827   BUN 25* 02/22/2015 0927   BUN 23.7 11/23/2014 0827   CREATININE 1.22 02/22/2015 0927   CREATININE 1.1 11/23/2014 0827      Component Value Date/Time   CALCIUM 8.3* 02/22/2015 0927   CALCIUM 8.0* 11/23/2014 0827   ALKPHOS 66 02/22/2015 0927   ALKPHOS 98 11/23/2014 0827   AST 22 02/22/2015 0927   AST 22 11/23/2014 0827   ALT 17 02/22/2015 0927   ALT 14 11/23/2014 0827   BILITOT 0.6  02/22/2015 0927   BILITOT 0.54 11/23/2014 0827     Results for DESHANNON, HINCHLIFFE (MRN [id number]) as of 06/07/2015 08:52  Ref. Range 05/06/2014 22:15 06/05/2014 09:59 07/05/2014 08:53 08/02/2014 13:56 08/30/2014 08:19 09/28/2014 09:01 11/23/2014 08:27 02/22/2015 08:18  PSA Latest Ref Range: <=4.00 ng/mL 70.23 (H) 60.63 (H) 10.66 (H) 5.67 (H) 4.61 (H) 4.79 (H) 3.21 1.68         ASSESSMENT AND PLAN : Jeiden T Auguste 81 y.o. male with:    1. Metastatic Prostate cancer. His disease appears to  be hormone sensitive with PSA dropping from 70.23 down to 1.68 with Lupron only.   His CT scan in February 2016 showed metastatic disease to the bone but no visceral metastasis. The fact that his PSA is dropping is a indicating that the current treatment is working. I reiterated the fact that he still has a poor prognosis and has an incurable malignancy. I believe Lupron is offering palliation of symptoms and controlling his cancer long-term. I will continue this medication for the time being.  These findings were discussed again with the patient and his family.  2. Pathologic R. Hip fracture(avulsion, less trochanteric) s/p R ORIF (05/10/2014). Not active at this time without any new fractures or dislocations.  3. Bone directed therapy: He continues to be on Xgeva which I will increase in frequency every 3 months. It will be a lot easier for him to come to clinic every 3 months versus monthly basis.  4. AVR s/p mechanical valve replacement: Anticoagulation resumed with Coumadin.  5. Diastolic CHF/HTN/CAD: follows up with his primary care physician and cardiologist. regarding this issue.  6. Normocytic anemia likely multifactorial in nature due to malignancy and chronic disease. His hemoglobin today is very close to normal and does not require any intervention.  7. Follow-up: Will be in 3 months for Lupron and Xgeva injections.   [facility], MD 06/07/2015

## 2015-06-08 LAB — PSA: PSA: 1.14 ng/mL (ref <=4.00)

## 2015-08-07 ENCOUNTER — Telehealth: Payer: Self-pay | Admitting: Oncology

## 2015-08-07 NOTE — Telephone Encounter (Signed)
Moved 10/18 f/u from [place] to [place]. Spoke with wife she is aware of new time for 10/18 @ 8:30 am.

## 2015-09-03 ENCOUNTER — Encounter: Payer: Self-pay | Admitting: *Deleted

## 2015-09-03 NOTE — Progress Notes (Signed)
Spoke with wife vivian. Cancelled 09/06/15 appt. Patient currently in nursing home for rehab. Wife will call and r/s once patient is transferred to home.

## 2015-09-06 ENCOUNTER — Ambulatory Visit: Payer: Medicare HMO | Admitting: Oncology

## 2015-09-06 ENCOUNTER — Other Ambulatory Visit: Payer: Medicare HMO

## 2015-09-06 ENCOUNTER — Ambulatory Visit: Payer: Medicare HMO

## 2015-09-18 ENCOUNTER — Telehealth: Payer: Self-pay | Admitting: Oncology

## 2015-09-18 NOTE — Telephone Encounter (Signed)
Vivian cld tomake an appt for pt stated just gertting out of nursing home and was told by Dr Nelva Nay to make appt once pt comes home-gave time & date

## 2015-09-20 ENCOUNTER — Telehealth: Payer: Self-pay | Admitting: Oncology

## 2015-09-20 ENCOUNTER — Other Ambulatory Visit (HOSPITAL_BASED_OUTPATIENT_CLINIC_OR_DEPARTMENT_OTHER): Payer: Medicare HMO

## 2015-09-20 ENCOUNTER — Ambulatory Visit (HOSPITAL_BASED_OUTPATIENT_CLINIC_OR_DEPARTMENT_OTHER): Payer: Medicare HMO

## 2015-09-20 ENCOUNTER — Ambulatory Visit (HOSPITAL_BASED_OUTPATIENT_CLINIC_OR_DEPARTMENT_OTHER): Payer: Medicare HMO | Admitting: Oncology

## 2015-09-20 VITALS — BP 168/74 | HR 59 | Temp 98.0°F | Resp 18 | Ht 68.0 in

## 2015-09-20 DIAGNOSIS — C7951 Secondary malignant neoplasm of bone: Secondary | ICD-10-CM | POA: Diagnosis not present

## 2015-09-20 DIAGNOSIS — C61 Malignant neoplasm of prostate: Secondary | ICD-10-CM

## 2015-09-20 DIAGNOSIS — I5030 Unspecified diastolic (congestive) heart failure: Secondary | ICD-10-CM | POA: Diagnosis not present

## 2015-09-20 DIAGNOSIS — D649 Anemia, unspecified: Secondary | ICD-10-CM

## 2015-09-20 DIAGNOSIS — Z5111 Encounter for antineoplastic chemotherapy: Secondary | ICD-10-CM

## 2015-09-20 LAB — CBC WITH DIFFERENTIAL/PLATELET
BASO%: 0.4 % (ref 0.0–2.0)
BASOS ABS: 0 10*3/uL (ref 0.0–0.1)
EOS%: 3.6 % (ref 0.0–7.0)
Eosinophils Absolute: 0.3 10*3/uL (ref 0.0–0.5)
HCT: 37.4 % — ABNORMAL LOW (ref 38.4–49.9)
HGB: 11.6 g/dL — ABNORMAL LOW (ref 13.0–17.1)
LYMPH%: 22.4 % (ref 14.0–49.0)
MCH: 28.9 pg (ref 27.2–33.4)
MCHC: 31 g/dL — ABNORMAL LOW (ref 32.0–36.0)
MCV: 93.3 fL (ref 79.3–98.0)
MONO#: 0.9 10*3/uL (ref 0.1–0.9)
MONO%: 11.5 % (ref 0.0–14.0)
NEUT#: 4.7 10*3/uL (ref 1.5–6.5)
NEUT%: 62.1 % (ref 39.0–75.0)
Platelets: 207 10*3/uL (ref 140–400)
RBC: 4.01 10*6/uL — AB (ref 4.20–5.82)
RDW: 15.3 % — ABNORMAL HIGH (ref 11.0–14.6)
WBC: 7.6 10*3/uL (ref 4.0–10.3)
lymph#: 1.7 10*3/uL (ref 0.9–3.3)

## 2015-09-20 LAB — COMPREHENSIVE METABOLIC PANEL (CC13)
ALT: 14 U/L (ref 0–55)
AST: 23 U/L (ref 5–34)
Albumin: 3.4 g/dL — ABNORMAL LOW (ref 3.5–5.0)
Alkaline Phosphatase: 77 U/L (ref 40–150)
Anion Gap: 8 mEq/L (ref 3–11)
BUN: 16 mg/dL (ref 7.0–26.0)
CHLORIDE: 104 meq/L (ref 98–109)
CO2: 28 meq/L (ref 22–29)
Calcium: 8.4 mg/dL (ref 8.4–10.4)
Creatinine: 1.1 mg/dL (ref 0.7–1.3)
EGFR: 61 mL/min/{1.73_m2} — ABNORMAL LOW (ref >90)
Glucose: 95 mg/dl (ref 70–140)
POTASSIUM: 4 meq/L (ref 3.5–5.1)
SODIUM: 140 meq/L (ref 136–145)
Total Bilirubin: 0.76 mg/dL (ref 0.20–1.20)
Total Protein: 7 g/dL (ref 6.4–8.3)

## 2015-09-20 MED ORDER — DENOSUMAB 120 MG/1.7ML ~~LOC~~ SOLN
120.0000 mg | Freq: Once | SUBCUTANEOUS | Status: AC
  Administered 2015-09-20: 120 mg via SUBCUTANEOUS
  Filled 2015-09-20: qty 1.7

## 2015-09-20 MED ORDER — LEUPROLIDE ACETATE (3 MONTH) 22.5 MG IM KIT
22.5000 mg | PACK | Freq: Once | INTRAMUSCULAR | Status: AC
  Administered 2015-09-20: 22.5 mg via INTRAMUSCULAR
  Filled 2015-09-20: qty 22.5

## 2015-09-20 NOTE — Telephone Encounter (Signed)
Gave pt appt for 12/19/15 @ 3.15pm.

## 2015-09-20 NOTE — Progress Notes (Signed)
Clayton [name], Clayton [name] [id number]  DIAGNOSIS: 81 year old  gentleman with hormone sensitive metastatic prostate cancer diagnosed in June 2015. He presented with bony metastasis and a PSA of 70.23.   CURRENT TREATMENT:   Lupron 22.5 mg q 3 months started on 06/05/2014. This will be given every 3 months. Completed one month of casodex 50 mg daily on 06/29/2014.  Xgeva given in September 2015. This will be given every 3 months as well.  INTERVAL HISTORY: Clayton [name] presents today for a follow-up visit with his wife. Since the last visit, he was hospitalized after a fall and missed his last appointment. He has recovered from his fall at that time and after brief stay at a skilled nursing facility, he is currently back at home.    He does report intermittent hip pain which have not changed dramatically. He does report pelvic pain where he fell but that is relatively mild and intermittent in nature.   He has a very limited performance status and for the most part is wheelchair-bound. He is also blind and have crippling arthritis. His quality of life very limited but unchanged. his appetite is reasonable and maintained reasonable weight.  He does not report any headaches or blurry vision or syncope. He does not report any fevers, chills, sweats or weight loss. He does not report any chest pain, palpitation orthopnea or dyspnea on exertion. He does not report any cough or hemoptysis or wheezing. Does not report any nausea, vomiting, abdominal pain. He does not report any constipation or diarrhea. Does not report any hematuria but does report nocturia and frequency. Rest of his review of systems unremarkable.  MEDICAL HISTORY: Past Medical History  Diagnosis Date  . CAD (coronary artery disease)     s/p CABG 1999  . HLD (hyperlipidemia)   . HTN (hypertension)   . GERD (gastroesophageal reflux disease)   . H/O hiatal  hernia   . Arthritis   . Peripheral vascular disease     aneursym  . Aortic aneurysm     at least 3 cmm infrarenal AAA by lumbar CT 05/22/13 [facility]); 41 mm proximal ascending aorta on echo 05/05/13 [facility])     ALLERGIES:  is allergic to tetanus toxoids.  MEDICATIONS:  Current Outpatient Prescriptions  Medication Sig Dispense Refill  . amoxicillin (AMOXIL) 500 MG tablet Take 4 tablets by mouth one hour prior to dental appointment 8 tablet 2  . aspirin EC 81 MG tablet Take 81 mg by mouth daily.    . cyanocobalamin (,VITAMIN B-12,) 1000 MCG/ML injection Inject 1,000 mcg into the muscle See admin instructions. 1000 mcg IM daily for 5 days (received 7/7-7/11), then weekly for 3 weeks (first dose due 7/18), then monthly    . denosumab (XGEVA) 120 MG/1.7ML SOLN injection Inject 120 mg into the skin once.    . donepezil (ARICEPT) 5 MG tablet Take 5 mg by mouth at bedtime.     . dorzolamide-timolol (COSOPT) 22.3-6.8 MG/ML ophthalmic solution Place 1 drop into both eyes 2 (two) times daily.     Marland Kitchen latanoprost (XALATAN) 0.005 % ophthalmic solution Place 1 drop into both eyes at bedtime.     Marland Kitchen leuprolide (LUPRON) 22.5 MG injection Inject 22.5 mg into the muscle every 3 (three) months.    Marland Kitchen lisinopril (PRINIVIL,ZESTRIL) 10 MG tablet Take 1 tablet by mouth daily.    . meclizine (ANTIVERT) 25 MG tablet Take 25 mg  by mouth daily as needed for dizziness or nausea.     . metoprolol (LOPRESSOR) 100 MG tablet Take 100 mg by mouth daily.     . metoprolol (LOPRESSOR) 50 MG tablet Take 50 mg by mouth at bedtime.    . multivitamin-lutein (OCUVITE-LUTEIN) CAPS Take 1 capsule by mouth 2 (two) times daily.      Marland Kitchen omeprazole (PRILOSEC) 20 MG capsule Take 20 mg by mouth daily.     . Tamsulosin HCl (FLOMAX) 0.4 MG CAPS Take 0.4 mg by mouth at bedtime.     Marland Kitchen warfarin (COUMADIN) 1 MG tablet Take 7 mg by mouth daily. Pt takes  5 mg and 2 1 mg tablets to equal a total of 7 mg on Sunday Thursday Saturday     . warfarin (COUMADIN) 4 MG tablet Take 4 mg by mouth daily.     No current facility-administered medications for this visit.    ECOG PERFORMANCE STATUS:  3  Blood pressure 168/74, pulse 59, temperature 98 F (36.7 C), temperature source Oral, resp. rate 18, height 5\' 8"  (1.727 m), SpO2 97 %.  GENERAL:chronically ill appearing gentleman. In a wheelchair today.not in any distress.  SKIN: No rashes or significant lesions  EYES: normal, Conjunctiva are pink and non-injected, sclera clear  OROPHARYNX:. No oral thrush. NECK: supple, thyroid normal size, non-tender. No lymphadenopathy noted. LYMPH: no palpable lymphadenopathy in the cervical, axillary or inguinal  LUNGS: clear to auscultation and percussion with normal breathing effort  HEART: regular rate & rhythm and no murmurs and no lower extremity edema  ABDOMEN:abdomen soft, non-tender and normal bowel sounds  no shifting dullness or ascites.  Musculoskeletal:no cyanosis of digits and no clubbing  NEURO: No focal deficits noted.  Labs:  Lab Results  Component Value Date   WBC 7.6 09/20/2015   HGB 11.6* 09/20/2015   HCT 37.4* 09/20/2015   MCV 93.3 09/20/2015   PLT 207 09/20/2015   NEUTROABS 4.7 09/20/2015      Chemistry      Component Value Date/Time   NA 141 06/07/2015 0832   NA 138 02/22/2015 0927   K 4.3 06/07/2015 0832   K 4.4 02/22/2015 0927   CL 103 02/22/2015 0927   CO2 28 06/07/2015 0832   CO2 28 02/22/2015 0927   BUN 24.1 06/07/2015 0832   BUN 25* 02/22/2015 0927   CREATININE 1.3 06/07/2015 0832   CREATININE 1.22 02/22/2015 0927      Component Value Date/Time   CALCIUM 8.9 06/07/2015 0832   CALCIUM 8.3* 02/22/2015 0927   ALKPHOS 79 06/07/2015 0832   ALKPHOS 66 02/22/2015 0927   AST 23 06/07/2015 0832   AST 22 02/22/2015 0927   ALT 21 06/07/2015 0832   ALT 17 02/22/2015 0927   BILITOT 0.46 06/07/2015 0832   BILITOT 0.6 02/22/2015 0927       Results for Clayton [name], Clayton [name] (MRN [id number]) as of  09/20/2015 15:32  Ref. Range 09/28/2014 09:01 11/23/2014 08:27 02/22/2015 08:18 06/07/2015 08:32  PSA Latest Ref Range: <=4.00 ng/mL 4.79 (H) 3.21 1.68 1.14        ASSESSMENT AND PLAN : Clayton [name] 81 y.o. male with:    1. Metastatic Prostate cancer. His disease appears to be hormone sensitive with PSA dropping from 70.23 down to 1.14 with Lupron only.   His CT scan in February 2016 showed metastatic disease to the bone but no visceral metastasis. His disease continues to be hormone sensitive and we'll continue with Lupron at this time.  His quality of life is stable and has not impaired by Lupron.   2. Pathologic R. Hip fracture(avulsion, less trochanteric) s/p R ORIF (05/10/2014). Not active at this time without any new fractures or dislocations.  3. Bone directed therapy: He continues to be on Xgeva and I have instructed him to continue with calcium and vitamin D.   4. AVR s/p mechanical valve replacement: Anticoagulation resumed with Coumadin.  5. Diastolic CHF/HTN/CAD: follows up with his primary care physician and cardiologist. regarding this issue.  6. Normocytic anemia likely multifactorial in nature due to malignancy and chronic disease. His hemoglobin continues to be stable.   7. Follow-up: Will be in 3 months for Lupron and Xgeva injections.   [facility], MD 09/20/2015

## 2015-09-21 LAB — PSA: PSA: 1.17 ng/mL (ref <=4.00)

## 2015-10-21 ENCOUNTER — Encounter: Payer: Self-pay | Admitting: Cardiovascular Disease

## 2015-10-21 ENCOUNTER — Ambulatory Visit (INDEPENDENT_AMBULATORY_CARE_PROVIDER_SITE_OTHER): Payer: Medicare HMO | Admitting: Cardiovascular Disease

## 2015-10-21 VITALS — BP 116/70 | HR 56 | Ht 68.0 in | Wt 199.0 lb

## 2015-10-21 DIAGNOSIS — I10 Essential (primary) hypertension: Secondary | ICD-10-CM

## 2015-10-21 DIAGNOSIS — I359 Nonrheumatic aortic valve disorder, unspecified: Secondary | ICD-10-CM | POA: Diagnosis not present

## 2015-10-21 DIAGNOSIS — I5032 Chronic diastolic (congestive) heart failure: Secondary | ICD-10-CM | POA: Diagnosis not present

## 2015-10-21 NOTE — Progress Notes (Signed)
Cardiology Office Note Date:  10/21/2015   ID:  Clayton [name], DOB Aug 28, 1926, MRN [id number]  PCP:  Clayton [name], MD  Cardiologist:  [name], MD    Chief Complaint  Patient presents with  . Annual Exam     History of Present Illness: Clayton [name] is a 81 y.o. male who presents for follow-up evaluation.  The patient has been followed for coronary and aortic valve disease. He underwent CABG/AVR in 1999 and was treated with a [employer] mechanical prosthesis.  Aortic valve mechanical prosthesis has functioned normally on serial echo testing. The patient has tolerated long-term warfarin  Without bleeding problems.   The patient is treated for metastatic prostate cancer. He has had a after lodging hip fracture. He is wheelchair-bound. He is completely blind and unable to get around much. He denies any cardiac symptoms. He specifically denies chest pain, shortness of breath, leg swelling, orthopnea, or PND.   Past Medical History  Diagnosis Date  . CAD (coronary artery disease)     s/p CABG 1999  . HLD (hyperlipidemia)   . HTN (hypertension)   . GERD (gastroesophageal reflux disease)   . H/O hiatal hernia   . Arthritis   . Peripheral vascular disease (HCC)     aneursym  . Aortic aneurysm (HCC)     at least 3 cmm infrarenal AAA by lumbar CT 05/22/13 [facility]); 41 mm proximal ascending aorta on echo 05/05/13 [facility])    Past Surgical History  Procedure Laterality Date  . Aortic valve replacement  1998  . Coronary artery bypass graft  1998  . Cholecystectomy    . Tonsillectomy    . US extremity*l* Left     arm  . Inguinal hernia repair  09/25/2013    with mesh  . Inguinal hernia repair Right 09/25/2013    Procedure: HERNIA REPAIR INGUINAL ADULT;  Surgeon: Imogene Burn. Georgette Dover, MD;  Location: [place];  Service: General;  Laterality: Right;  . Insertion of mesh Right 09/25/2013    Procedure: INSERTION OF MESH;  Surgeon: Imogene Burn. Georgette Dover, MD;  Location: [place];  Service:  General;  Laterality: Right;  . Hand surgery Left     MULTIPLE   . Intramedullary (im) nail intertrochanteric Right 05/10/2014    Procedure: Open Reduction Internal Fixation Right Hip;  Surgeon: Mcarthur Rossetti, MD;  Location: [place];  Service: Orthopedics;  Laterality: Right;    Current Outpatient Prescriptions  Medication Sig Dispense Refill  . amoxicillin (AMOXIL) 500 MG tablet Take 4 tablets by mouth one hour prior to dental appointment 8 tablet 2  . aspirin EC 81 MG tablet Take 81 mg by mouth daily.    . cyanocobalamin (,VITAMIN B-12,) 1000 MCG/ML injection Inject 1,000 mcg into the muscle See admin instructions. 1000 mcg IM daily for 5 days (received 7/7-7/11), then weekly for 3 weeks (first dose due 7/18), then monthly    . denosumab (XGEVA) 120 MG/1.7ML SOLN injection Inject 120 mg into the skin once.    . donepezil (ARICEPT) 5 MG tablet Take 5 mg by mouth at bedtime.     Marland Kitchen latanoprost (XALATAN) 0.005 % ophthalmic solution Place 1 drop into both eyes at bedtime.     Marland Kitchen leuprolide (LUPRON) 22.5 MG injection Inject 22.5 mg into the muscle every 3 (three) months.    Marland Kitchen lisinopril (PRINIVIL,ZESTRIL) 10 MG tablet Take 1 tablet by mouth daily.    . metoprolol (LOPRESSOR) 100 MG tablet Take 100 mg by mouth daily.     Marland Kitchen  metoprolol (LOPRESSOR) 50 MG tablet Take 50 mg by mouth at bedtime.    . multivitamin-lutein (OCUVITE-LUTEIN) CAPS Take 1 capsule by mouth 2 (two) times daily.      Marland Kitchen omeprazole (PRILOSEC) 20 MG capsule Take 20 mg by mouth daily.     . Tamsulosin HCl (FLOMAX) 0.4 MG CAPS Take 0.4 mg by mouth at bedtime.     Marland Kitchen warfarin (COUMADIN) 1 MG tablet Take 7 mg by mouth daily. Pt takes  5 mg and 2 1 mg tablets to equal a total of 7 mg on Sunday Thursday Saturday    . warfarin (COUMADIN) 4 MG tablet Take 4 mg by mouth daily.     No current facility-administered medications for this visit.    Allergies:   Tetanus toxoids   Social History:  The patient  reports that he has quit  smoking. He has never used smokeless tobacco. He reports that he does not drink alcohol or use illicit drugs.   Family History:  The patient's  family history includes Coronary artery disease in an other family member; Heart attack in his brother.    ROS:  Please see the history of present illness.  Otherwise, review of systems is positive for  Hearing loss, visual changes , easy bruising, excessive sweating, wheezing, balance problems.  All other systems are reviewed and negative.    PHYSICAL EXAM: VS:  BP 116/70 mmHg  Pulse 56  Ht 5\' 8"  (1.727 m)  Wt 199 lb (90.266 kg)  BMI 30.26 kg/m2 , BMI Body mass index is 30.26 kg/(m^2). GEN: Well nourished, well developed, in no acute distress HEENT: normal Neck: no JVD, no masses. No carotid bruits Cardiac: RRR with  2/6 ejection murmur at the right upper sternal border with normal mechanical A2.              Respiratory:  clear to auscultation bilaterally, normal work of breathing GI: soft, nontender, nondistended, + BS MS: no deformity or atrophy Ext: no pretibial edema, pedal pulses 2+= bilaterally Skin: warm and dry, no rash Neuro:  Strength and sensation are intact Psych: euthymic mood, full affect  EKG:  EKG is ordered today. The ekg ordered today shows  Sinus bradycardia with first-degree AV block heart rate 56 bpm, otherwise within normal limits.  Recent Labs: 09/20/2015: ALT 14; BUN 16.0; Creatinine 1.1; HGB 11.6*; Platelets 207; Potassium 4.0; Sodium 140   Lipid Panel  No results found for: CHOL, TRIG, HDL, CHOLHDL, VLDL, LDLCALC, LDLDIRECT    Wt Readings from Last 3 Encounters:  10/21/15 199 lb (90.266 kg)  02/22/15 201 lb 1.6 oz (91.218 kg)  02/18/15 201 lb (91.173 kg)     Cardiac Studies Reviewed: 2D Echo  Study Conclusions  - Left ventricle: The cavity size was normal. Wall thickness was normal. Systolic function was normal. The estimated ejection fraction was in the range of 60% to 65%. Doppler parameters  are consistent with abnormal left ventricular relaxation (grade 1 diastolic dysfunction). - Aortic valve: AV prosthesis is difficult to see well Peak and mean gradients through the valve are 14 and 6 mm Hg respectively.  ASSESSMENT AND PLAN: 1.   Aortic valve disease status post mechanical aortic valve replacement: Patient he has no specific cardiopulmonary symptoms. He has normal valve function by echocardiogram in 2014 and stability of his clinical exam. He is tolerating long-term warfarin without bleeding complications. I will see him back in 6 months.  2. Coronary artery disease status post CABG: No symptoms of angina.  His medical program is reviewed and will be continued.  3. Essential hypertension: Blood pressure is controlled on his current program.   In summary, while his health is declining overall, he appears to be stable from a cardiac perspective.  Current medicines are reviewed with the patient today.  The patient does not have concerns regarding medicines.  Labs/ tests ordered today include:  No orders of the defined types were placed in this encounter.    Disposition:   FU 6 months  Signed, [name], MD  10/21/2015 2:17 PM    [facility] [address], [place], [place]  [address] Phone: [phone]; Fax: [phone]

## 2015-10-21 NOTE — Patient Instructions (Signed)

## 2015-10-23 IMAGING — CR DG PELVIS 1-2V
2 series · 2 of 2 positions shown · non-contrast
Comparison: None.

CLINICAL DATA: Fall

EXAM:
PELVIS - 1-2 VIEW

[t pelvis a.p. (1 of 2)]
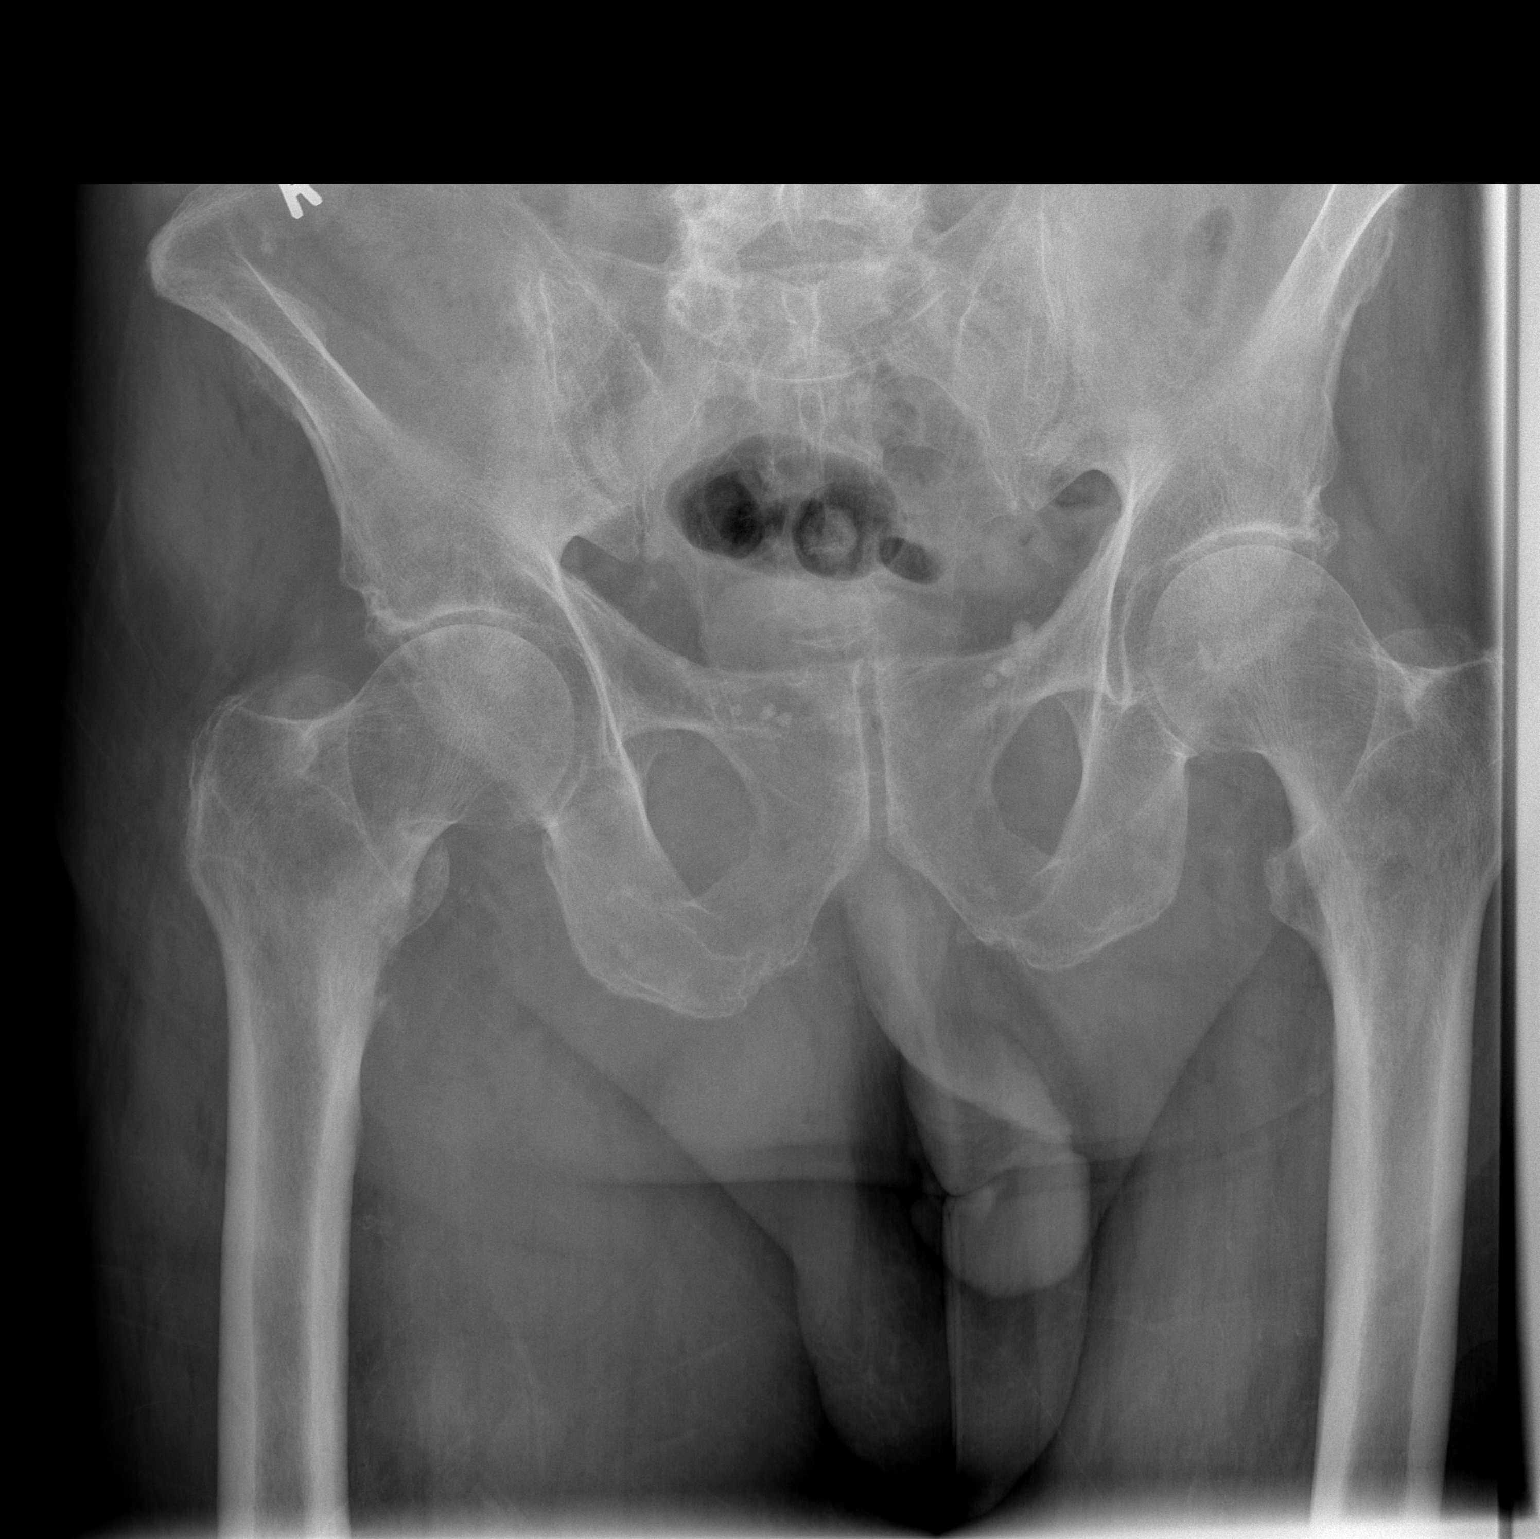

[t pelvis a.p. (2 of 2)]
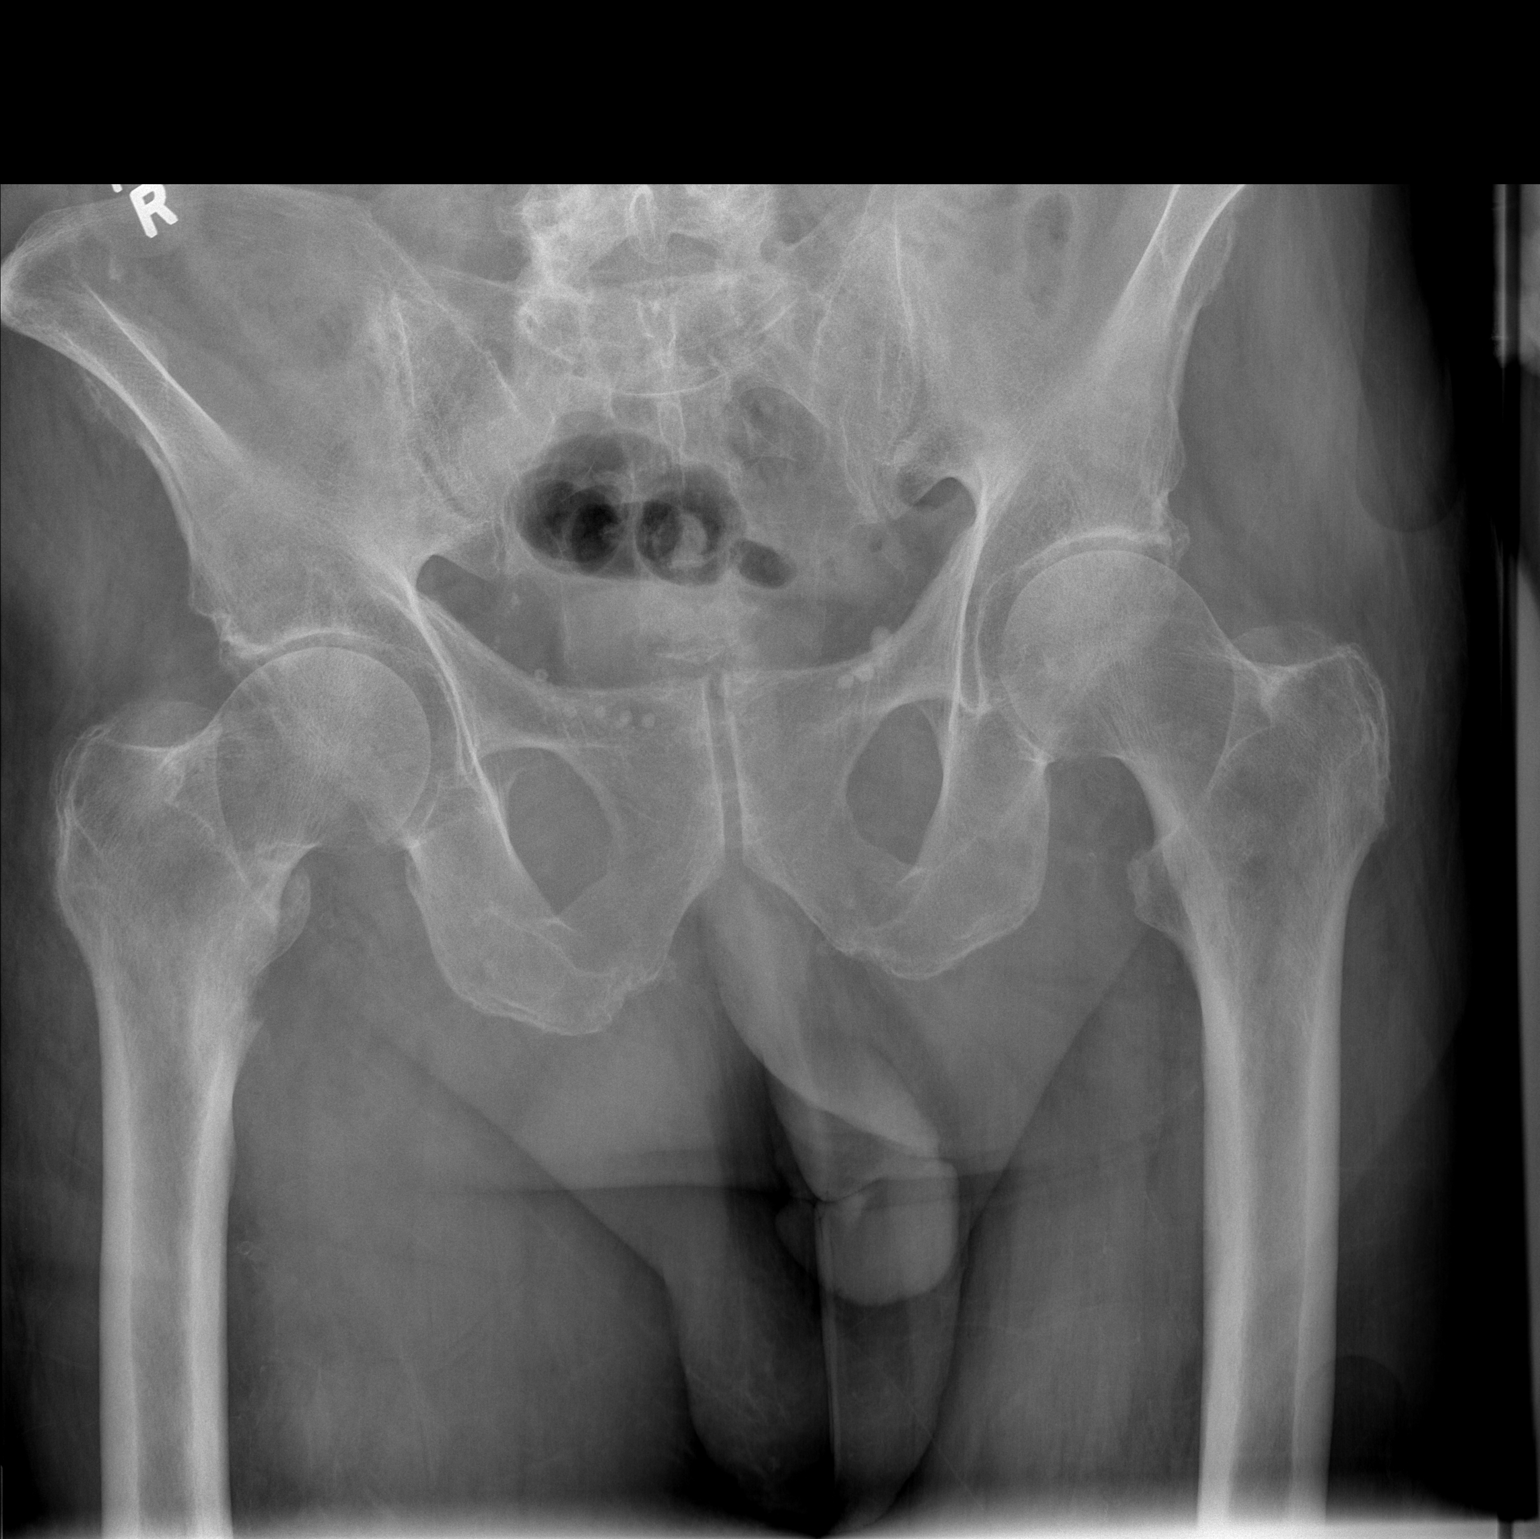

[2 of 2 positions shown; findings below may reference images not displayed]

FINDINGS: There is a displaced fracture of the lesser trochanter. There is
also lucency in the intertrochanteric region medially. There is
slight varus deformity of the proximal femur. Impaction
intertrochanteric fracture is not excluded. Osteopenia.
IMPRESSION: Avulsed fracture of the lesser trochanter.

Possible intertrochanteric femur fracture.  CT is recommended.

## 2015-10-23 IMAGING — CR DG FEMUR 2+V*R*
1 series · 1 of 1 positions shown · non-contrast
Comparison: None.

CLINICAL DATA: Fall

EXAM:
RIGHT FEMUR - 2 VIEW

[view not recorded]
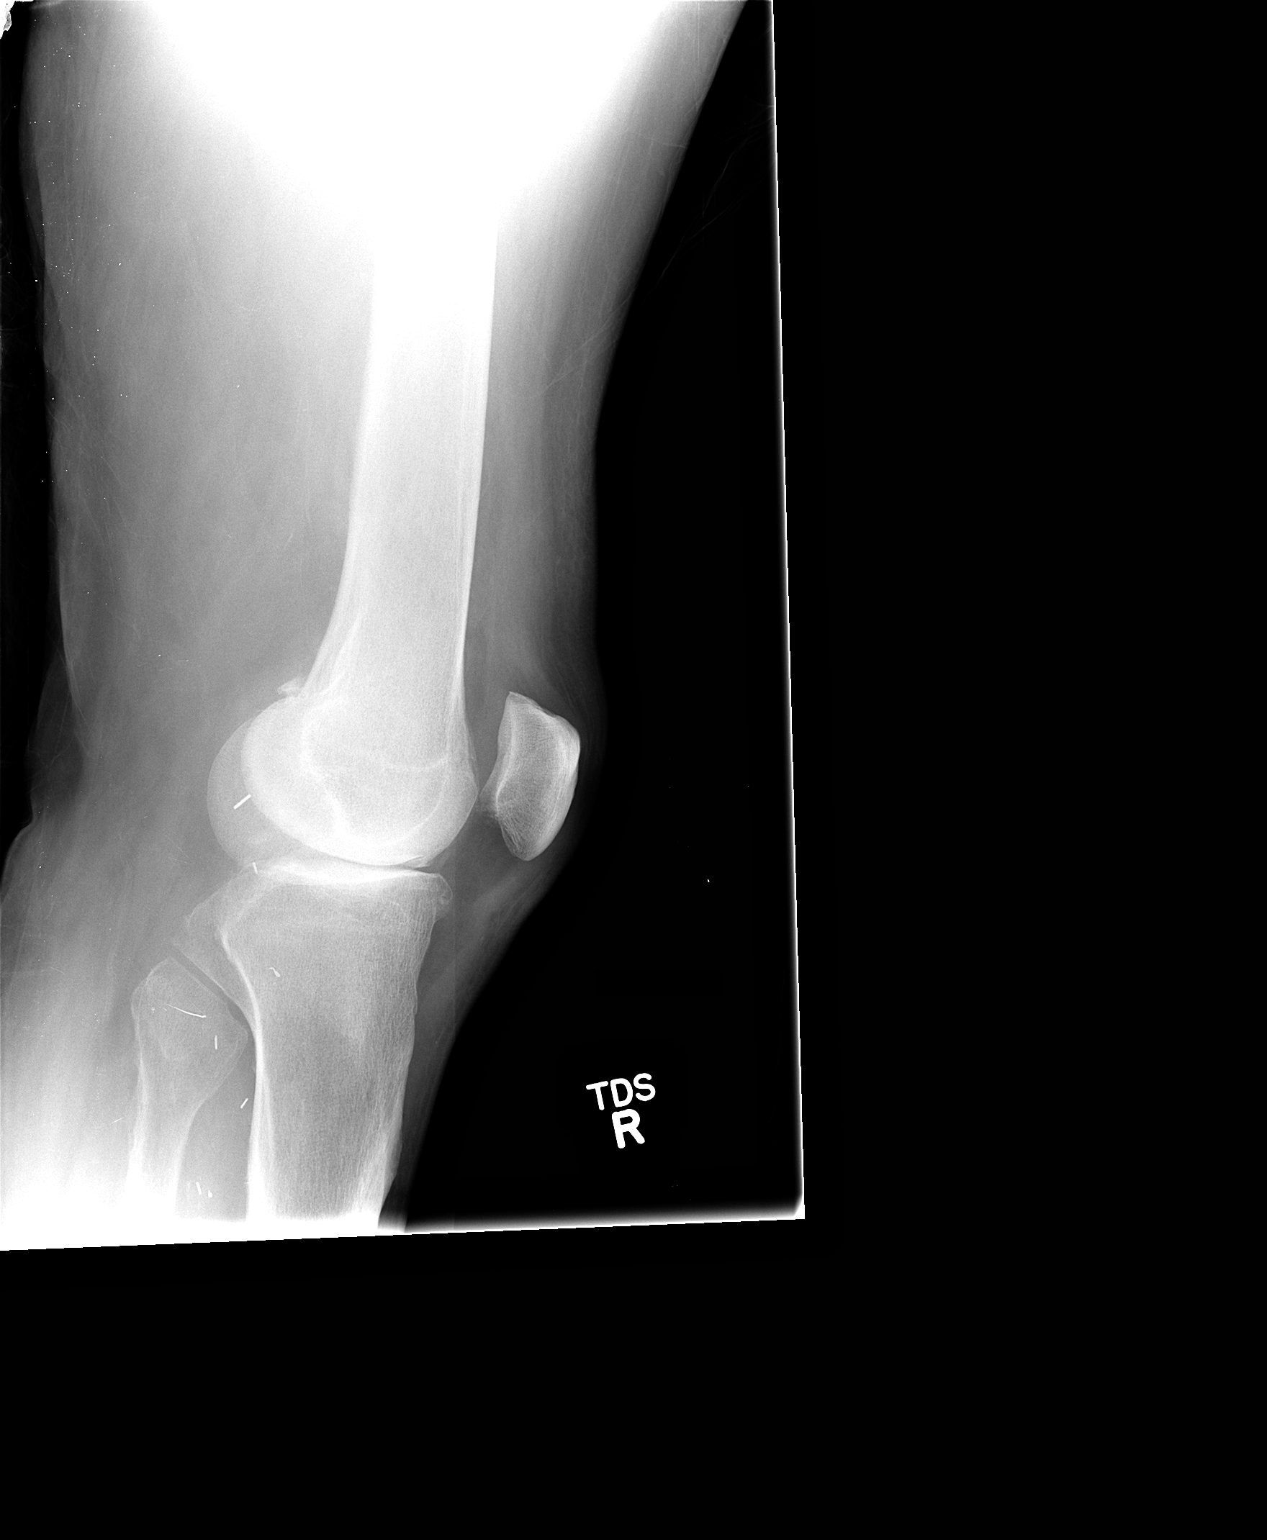

[1 of 1 positions shown; findings below may reference images not displayed]

FINDINGS: The lesser trochanter is avulsed and displaced superiorly. There are
overlapping structures at the intertrochanteric region. There is
also slight varus deformity of the proximal femur. Underlying
intertrochanteric fracture is not excluded.
IMPRESSION: Avulsed fracture of the right lesser trochanter. There may be an
intertrochanteric fracture. Attention on pelvic radiograph is
recommended.

## 2015-10-27 IMAGING — RF DG C-ARM 61-120 MIN
1 series · 6 of 6 positions shown · non-contrast
Comparison: MRI of the right hip from 2 days prior

CLINICAL DATA: Right hip ORIF

EXAM:
DG C-ARM 61-120 MIN; RIGHT FEMUR - 2 VIEW
:

[Series 1: run · 6 of 6 slices shown]
[im 1/6]
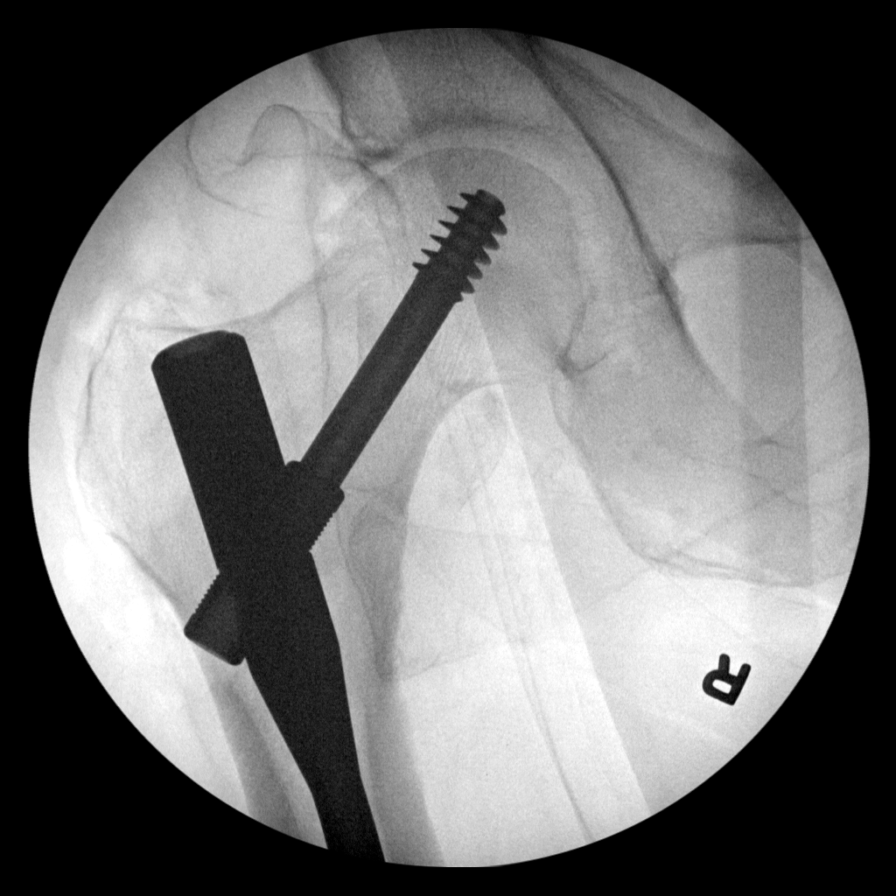
[im 2/6]
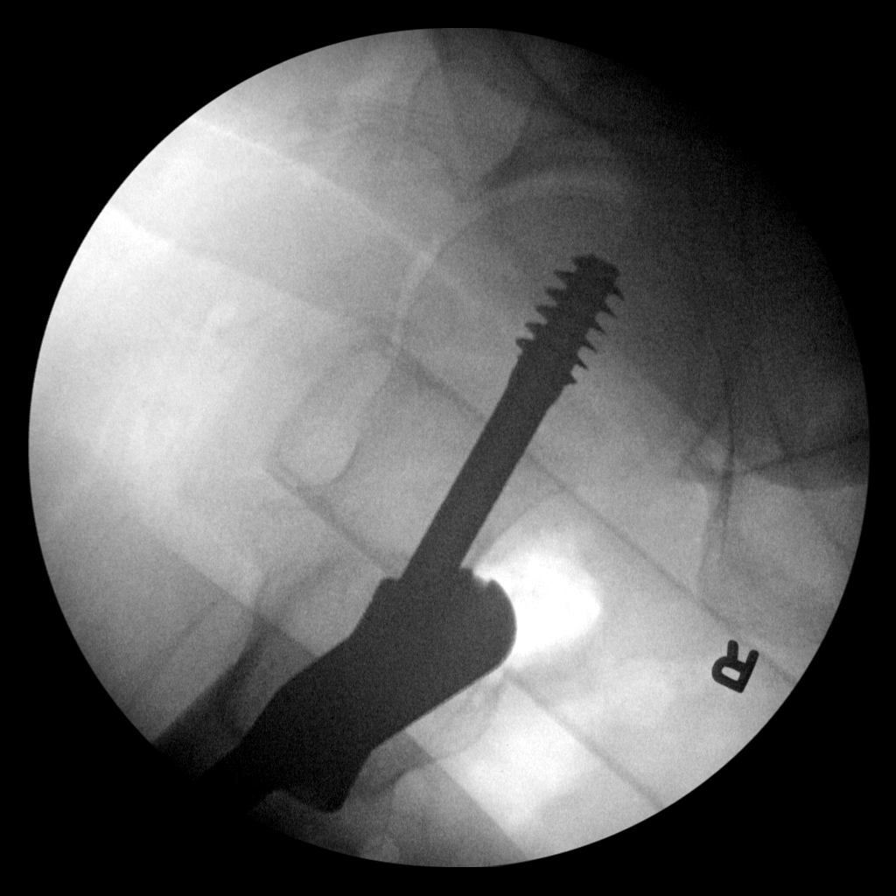
[im 3/6]
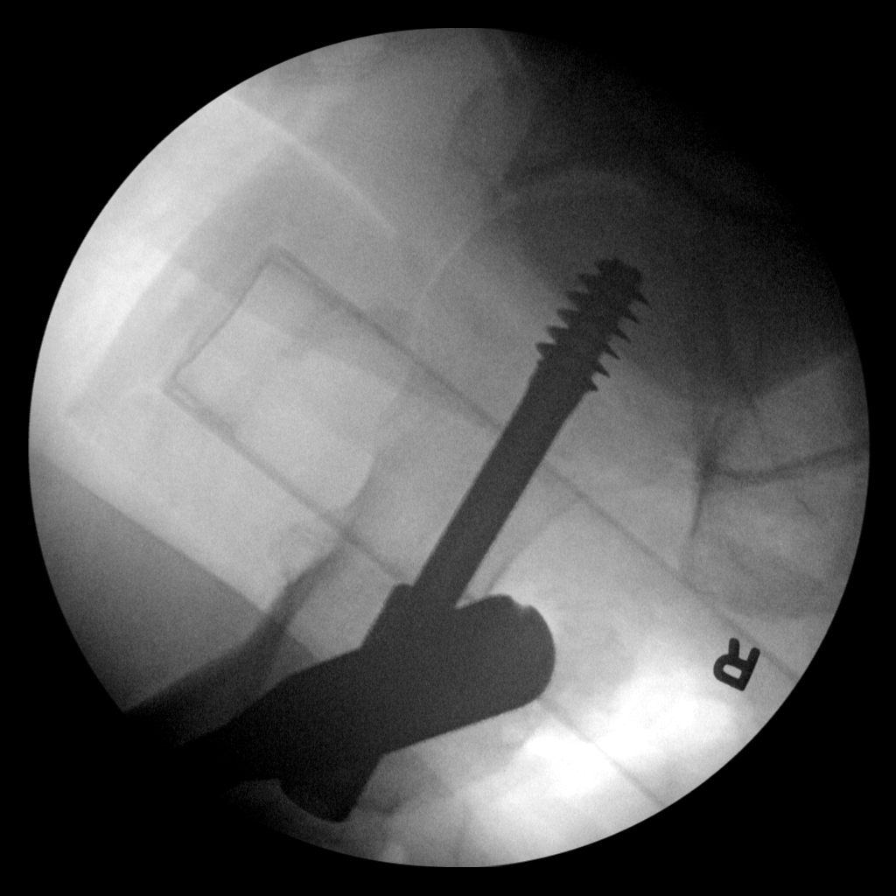
[im 4/6]
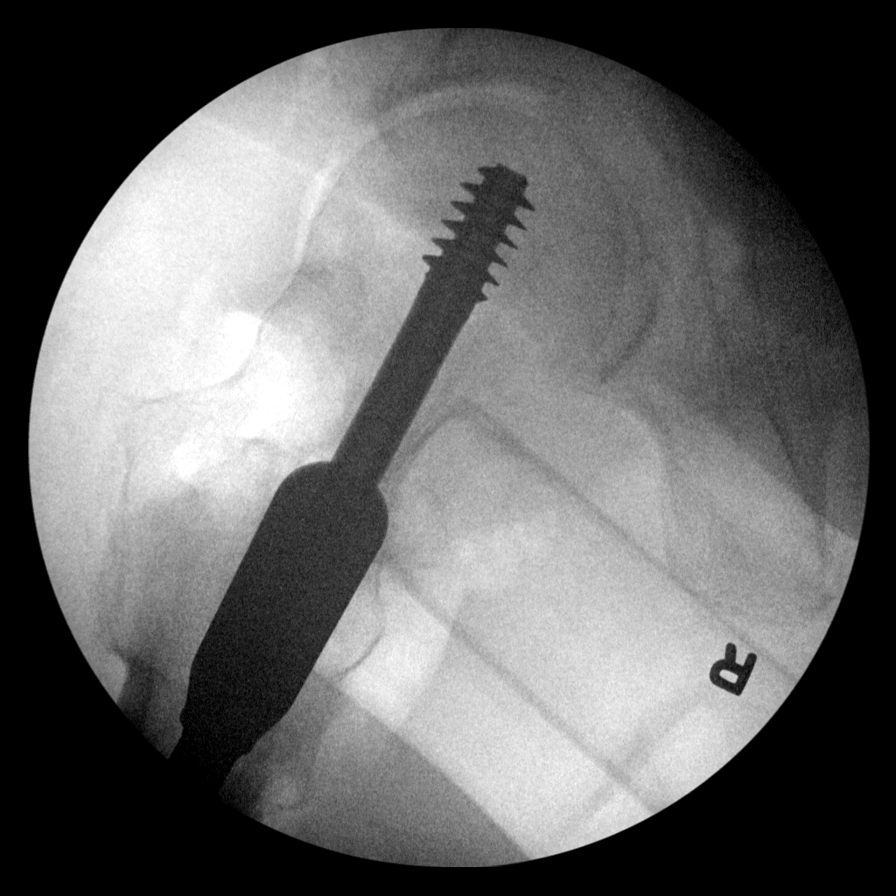
[im 5/6]
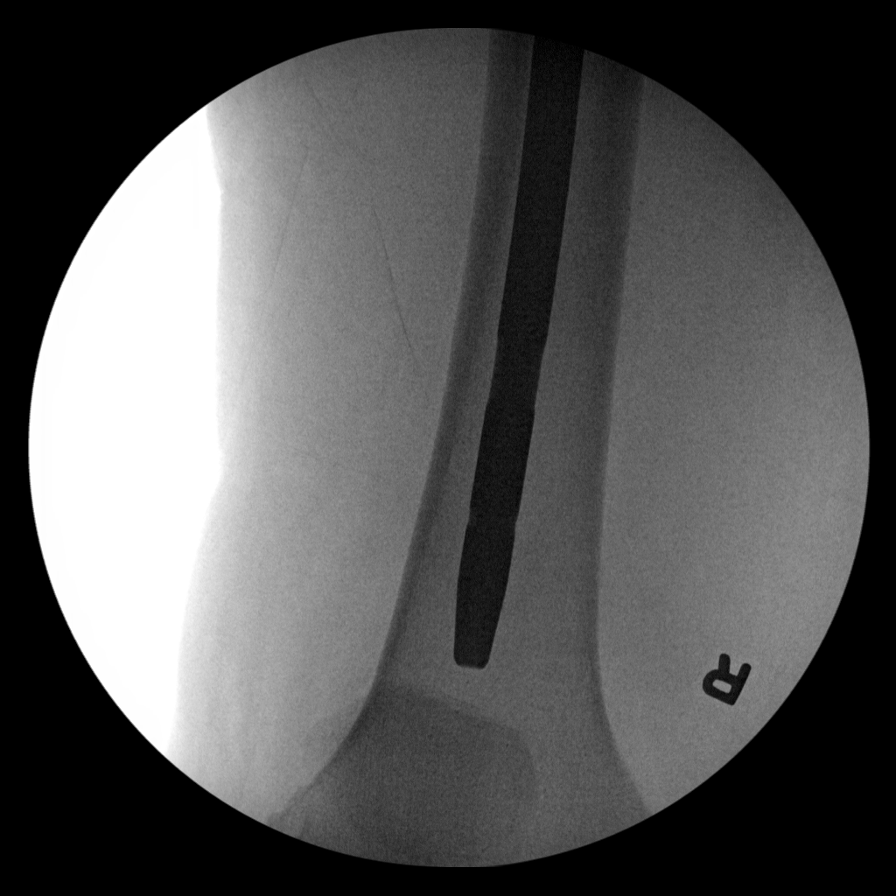
[im 6/6]
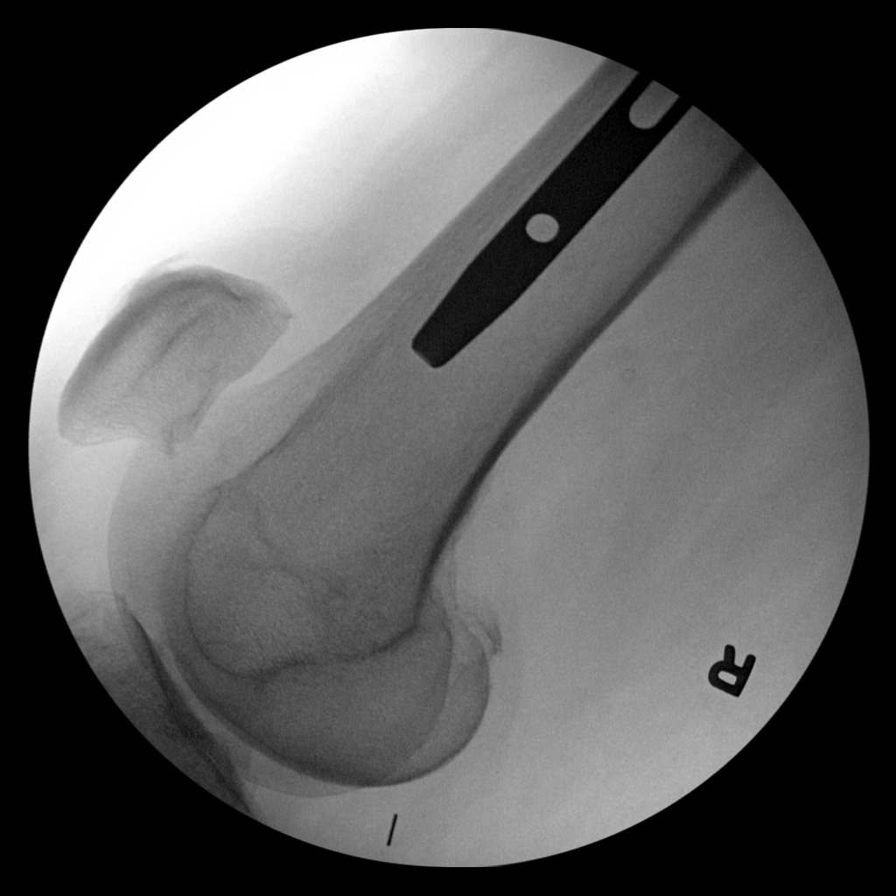

[6 of 6 positions shown; findings below may reference images not displayed]

FINDINGS: Status post intra medullary nail fixation of the femur with dynamic
hip screw. No evidence of periprosthetic fracture or other
complication.
IMPRESSION: Right femur internal fixation.

## 2015-12-19 ENCOUNTER — Other Ambulatory Visit (HOSPITAL_BASED_OUTPATIENT_CLINIC_OR_DEPARTMENT_OTHER): Payer: Medicare HMO

## 2015-12-19 ENCOUNTER — Telehealth: Payer: Self-pay | Admitting: Oncology

## 2015-12-19 ENCOUNTER — Ambulatory Visit (HOSPITAL_BASED_OUTPATIENT_CLINIC_OR_DEPARTMENT_OTHER): Payer: Medicare HMO | Admitting: Oncology

## 2015-12-19 ENCOUNTER — Ambulatory Visit (HOSPITAL_BASED_OUTPATIENT_CLINIC_OR_DEPARTMENT_OTHER): Payer: Medicare HMO

## 2015-12-19 VITALS — BP 176/82 | HR 66 | Temp 98.0°F | Resp 16 | Ht 68.0 in

## 2015-12-19 DIAGNOSIS — I5032 Chronic diastolic (congestive) heart failure: Secondary | ICD-10-CM | POA: Diagnosis not present

## 2015-12-19 DIAGNOSIS — C61 Malignant neoplasm of prostate: Secondary | ICD-10-CM

## 2015-12-19 DIAGNOSIS — C7951 Secondary malignant neoplasm of bone: Secondary | ICD-10-CM

## 2015-12-19 DIAGNOSIS — I10 Essential (primary) hypertension: Secondary | ICD-10-CM | POA: Diagnosis not present

## 2015-12-19 DIAGNOSIS — Z5111 Encounter for antineoplastic chemotherapy: Secondary | ICD-10-CM | POA: Diagnosis not present

## 2015-12-19 LAB — COMPREHENSIVE METABOLIC PANEL
ALBUMIN: 3.6 g/dL (ref 3.5–5.0)
ALK PHOS: 78 U/L (ref 40–150)
ALT: 16 U/L (ref 0–55)
AST: 21 U/L (ref 5–34)
Anion Gap: 9 mEq/L (ref 3–11)
BILIRUBIN TOTAL: 0.51 mg/dL (ref 0.20–1.20)
BUN: 23.5 mg/dL (ref 7.0–26.0)
CO2: 27 mEq/L (ref 22–29)
Calcium: 9 mg/dL (ref 8.4–10.4)
Chloride: 104 mEq/L (ref 98–109)
Creatinine: 1.3 mg/dL (ref 0.7–1.3)
EGFR: 47 mL/min/{1.73_m2} — AB (ref >90)
Glucose: 92 mg/dl (ref 70–140)
Potassium: 4 mEq/L (ref 3.5–5.1)
Sodium: 141 mEq/L (ref 136–145)
TOTAL PROTEIN: 7.3 g/dL (ref 6.4–8.3)

## 2015-12-19 LAB — CBC WITH DIFFERENTIAL/PLATELET
BASO%: 0.4 % (ref 0.0–2.0)
BASOS ABS: 0 10*3/uL (ref 0.0–0.1)
EOS ABS: 0.2 10*3/uL (ref 0.0–0.5)
EOS%: 2.6 % (ref 0.0–7.0)
HEMATOCRIT: 40.2 % (ref 38.4–49.9)
HEMOGLOBIN: 13.1 g/dL (ref 13.0–17.1)
LYMPH%: 23.8 % (ref 14.0–49.0)
MCH: 29.4 pg (ref 27.2–33.4)
MCHC: 32.5 g/dL (ref 32.0–36.0)
MCV: 90.4 fL (ref 79.3–98.0)
MONO#: 0.7 10*3/uL (ref 0.1–0.9)
MONO%: 9.2 % (ref 0.0–14.0)
NEUT#: 4.8 10*3/uL (ref 1.5–6.5)
NEUT%: 64 % (ref 39.0–75.0)
Platelets: 161 10*3/uL (ref 140–400)
RBC: 4.44 10*6/uL (ref 4.20–5.82)
RDW: 15 % — ABNORMAL HIGH (ref 11.0–14.6)
WBC: 7.6 10*3/uL (ref 4.0–10.3)
lymph#: 1.8 10*3/uL (ref 0.9–3.3)

## 2015-12-19 MED ORDER — LEUPROLIDE ACETATE (3 MONTH) 22.5 MG IM KIT
22.5000 mg | PACK | Freq: Once | INTRAMUSCULAR | Status: AC
  Administered 2015-12-19: 22.5 mg via INTRAMUSCULAR
  Filled 2015-12-19: qty 22.5

## 2015-12-19 NOTE — Telephone Encounter (Signed)
Appointments made and avs printed. Patient requested days other that what was on the pof due to transportation. Cental scheduling will contact pt for bone scan appt

## 2015-12-19 NOTE — Progress Notes (Signed)
[facility] OFFICE PROGRESS NOTE 12/19/2015   [name], MD [place] [address]  DIAGNOSIS: 82 year old  gentleman with hormone sensitive metastatic prostate cancer diagnosed in June 2015. He presented with bony metastasis and a PSA of 70.23.   CURRENT TREATMENT:   Lupron 22.5 mg q 3 months started on 06/05/2014. This will be given every 3 months. Completed one month of casodex 50 mg daily on 06/29/2014.  Xgeva given in September 2015. This will be discontinued based on patient's preference.  INTERVAL HISTORY: Mr. Shostak presents today for a follow-up visit with his wife. Since the last visit, he reports no major changes in his health. He is very hard of hearing and most of the history provided by his wife. He does not report any discomfort or any complaints. He has not had any hospitalization or illnesses. His mobility is limited and currently in a chair most of the day.  He continues to tolerate Lupron with hot flashes but no other complications. Has not reported any falls, fractures or syncope.  He does not report any headaches or blurry vision or syncope. He does not report any fevers, chills, sweats or weight loss. He does not report any chest pain, palpitation orthopnea or dyspnea on exertion. He does not report any cough or hemoptysis or wheezing. Does not report any nausea, vomiting, abdominal pain. He does not report any constipation or diarrhea. Does not report any hematuria but does report nocturia and frequency. Rest of his review of systems unremarkable.  MEDICAL HISTORY: Past Medical History  Diagnosis Date  . CAD (coronary artery disease)     s/p CABG 1999  . HLD (hyperlipidemia)   . HTN (hypertension)   . GERD (gastroesophageal reflux disease)   . H/O hiatal hernia   . Arthritis   . Peripheral vascular disease (HCC)     aneursym  . Aortic aneurysm (HCC)     at least 3 cmm infrarenal AAA by lumbar CT 05/22/13 [facility]); 41 mm proximal ascending aorta on echo 05/05/13 [facility])     ALLERGIES:  is allergic to tetanus toxoids.  MEDICATIONS:  Current Outpatient Prescriptions  Medication Sig Dispense Refill  . amoxicillin (AMOXIL) 500 MG tablet Take 4 tablets by mouth one hour prior to dental appointment 8 tablet 2  . aspirin EC 81 MG tablet Take 81 mg by mouth daily.    . cyanocobalamin (,VITAMIN B-12,) 1000 MCG/ML injection Inject 1,000 mcg into the muscle See admin instructions. 1000 mcg IM daily for 5 days (received 7/7-7/11), then weekly for 3 weeks (first dose due 7/18), then monthly    . denosumab (XGEVA) 120 MG/1.7ML SOLN injection Inject 120 mg into the skin once.    . donepezil (ARICEPT) 5 MG tablet Take 5 mg by mouth at bedtime.     Marland Kitchen latanoprost (XALATAN) 0.005 % ophthalmic solution Place 1 drop into both eyes at bedtime.     Marland Kitchen leuprolide (LUPRON) 22.5 MG injection Inject 22.5 mg into the muscle every 3 (three) months.    Marland Kitchen lisinopril (PRINIVIL,ZESTRIL) 10 MG tablet Take 1 tablet by mouth daily.    . metoprolol (LOPRESSOR) 100 MG tablet Take 100 mg by mouth daily.     . metoprolol (LOPRESSOR) 50 MG tablet Take 50 mg by mouth at bedtime.    . multivitamin-lutein (OCUVITE-LUTEIN) CAPS Take 1 capsule by mouth 2 (two) times daily.      Marland Kitchen omeprazole (PRILOSEC) 20 MG capsule Take 20 mg by  mouth daily.     . Tamsulosin HCl (FLOMAX) 0.4 MG CAPS Take 0.4 mg by mouth at bedtime.     Marland Kitchen warfarin (COUMADIN) 1 MG tablet Take 7 mg by mouth daily. Pt takes  5 mg and 2 1 mg tablets to equal a total of 7 mg on Sunday Thursday Saturday    . warfarin (COUMADIN) 4 MG tablet Take 4 mg by mouth daily.     No current facility-administered medications for this visit.   Facility-Administered Medications Ordered in Other Visits  Medication Dose Route Frequency Provider Last Rate Last Dose  . leuprolide (LUPRON) injection 22.5 mg  22.5 mg Intramuscular Once Wyatt Portela, MD        ECOG PERFORMANCE  STATUS:  3  Blood pressure 176/82, pulse 66, temperature 98 F (36.7 C), temperature source Oral, resp. rate 16, height 5\' 8"  (1.727 m), SpO2 99 %.  GENERAL:chronically ill appearing in a wheelchair today not in any distress.  SKIN: No rashes or significant lesions  EYES: Conjunctiva are pink and non-injected, sclera clear  OROPHARYNX:. No oral thrush. NECK: supple, thyroid normal size, non-tender. No lymphadenopathy noted. LYMPH: no palpable lymphadenopathy in the cervical, axillary or inguinal  LUNGS: clear to auscultation and percussion with normal breathing effort  HEART: regular rate & rhythm and no murmurs and no lower extremity edema  ABDOMEN:abdomen soft, non-tender and normal bowel sounds  no rebound or guarding. Musculoskeletal:no cyanosis of digits and no clubbing  NEURO: No focal deficits noted.  Labs:  Lab Results  Component Value Date   WBC 7.6 12/19/2015   HGB 13.1 12/19/2015   HCT 40.2 12/19/2015   MCV 90.4 12/19/2015   PLT 161 12/19/2015   NEUTROABS 4.8 12/19/2015      Chemistry      Component Value Date/Time   NA 141 12/19/2015 1449   NA 138 02/22/2015 0927   K 4.0 12/19/2015 1449   K 4.4 02/22/2015 0927   CL 103 02/22/2015 0927   CO2 27 12/19/2015 1449   CO2 28 02/22/2015 0927   BUN 23.5 12/19/2015 1449   BUN 25* 02/22/2015 0927   CREATININE 1.3 12/19/2015 1449   CREATININE 1.22 02/22/2015 0927      Component Value Date/Time   CALCIUM 9.0 12/19/2015 1449   CALCIUM 8.3* 02/22/2015 0927   ALKPHOS 78 12/19/2015 1449   ALKPHOS 66 02/22/2015 0927   AST 21 12/19/2015 1449   AST 22 02/22/2015 0927   ALT 16 12/19/2015 1449   ALT 17 02/22/2015 0927   BILITOT 0.51 12/19/2015 1449   BILITOT 0.6 02/22/2015 0927      Results for EYASU, JILES (MRN [id number]) as of 12/19/2015 15:03  Ref. Range 11/23/2014 08:27 02/22/2015 08:18 06/07/2015 08:32 09/20/2015 15:15  PSA Latest Ref Range: <=4.00 ng/mL 3.21 1.68 1.14 1.17       ASSESSMENT AND PLAN : Clayton [name]  82 y.o. male with:    1. Metastatic Prostate cancer. His disease appears to be hormone sensitive with PSA dropping from 70.23 down to 1.14 with Lupron only. His CT scan in February 2016 showed metastatic disease to the bone but no visceral metastasis.   He is currently on Lupron and have tolerated it well. His PSA continues to be under excellent control at 1.17 in November 2016. The plan is to continue with the same dose and schedule and repeat a bone scan before the next visit in May 2017.    2. Pathologic R. Hip fracture(avulsion, less trochanteric)  s/p R ORIF (05/10/2014). Not active at this time without any new fractures or dislocations.  3. Bone directed therapy: He prefers to stop this medication altogether from a financial standpoint. And we will discontinue it at this time. We'll consider restarting him in the future if he develops worsening bony disease.  4. AVR s/p mechanical valve replacement: Anticoagulation resumed with Coumadin.  5. Diastolic CHF/HTN/CAD: follows up with his primary care physician and cardiologist. regarding this issue.  6. Anemia: This have resolved at this time with a normal hemoglobin.  7. Follow-up: Will be in 3 months for Lupron and Xgeva injections.   [facility], MD 12/19/2015

## 2015-12-20 LAB — PSA: PROSTATE SPECIFIC AG, SERUM: 2.7 ng/mL (ref 0.0–4.0)

## 2015-12-20 LAB — PSA (PARALLEL TESTING): PSA: 2.62 ng/mL (ref <=4.00)

## 2016-03-17 ENCOUNTER — Telehealth: Payer: Self-pay | Admitting: Oncology

## 2016-03-17 NOTE — Telephone Encounter (Signed)
returned call and s.w. pt and confirmed appt....pt ok and aware °

## 2016-03-19 ENCOUNTER — Other Ambulatory Visit (HOSPITAL_BASED_OUTPATIENT_CLINIC_OR_DEPARTMENT_OTHER): Payer: Medicare HMO

## 2016-03-19 ENCOUNTER — Encounter (HOSPITAL_COMMUNITY)
Admission: RE | Admit: 2016-03-19 | Discharge: 2016-03-19 | Disposition: A | Discharge status: Home / Self Care | Payer: Medicare HMO | Source: Ambulatory Visit | Attending: Oncology | Admitting: Oncology

## 2016-03-19 DIAGNOSIS — C61 Malignant neoplasm of prostate: Secondary | ICD-10-CM | POA: Diagnosis present

## 2016-03-19 DIAGNOSIS — C7951 Secondary malignant neoplasm of bone: Secondary | ICD-10-CM | POA: Diagnosis not present

## 2016-03-19 LAB — CBC WITH DIFFERENTIAL/PLATELET
BASO%: 0.6 % (ref 0.0–2.0)
BASOS ABS: 0 10*3/uL (ref 0.0–0.1)
EOS ABS: 0.3 10*3/uL (ref 0.0–0.5)
EOS%: 3.7 % (ref 0.0–7.0)
HEMATOCRIT: 38.5 % (ref 38.4–49.9)
HEMOGLOBIN: 12.5 g/dL — AB (ref 13.0–17.1)
LYMPH#: 2 10*3/uL (ref 0.9–3.3)
LYMPH%: 24.9 % (ref 14.0–49.0)
MCH: 30.2 pg (ref 27.2–33.4)
MCHC: 32.5 g/dL (ref 32.0–36.0)
MCV: 93 fL (ref 79.3–98.0)
MONO#: 0.6 10*3/uL (ref 0.1–0.9)
MONO%: 7.9 % (ref 0.0–14.0)
NEUT#: 5.1 10*3/uL (ref 1.5–6.5)
NEUT%: 62.9 % (ref 39.0–75.0)
PLATELETS: 181 10*3/uL (ref 140–400)
RBC: 4.14 10*6/uL — ABNORMAL LOW (ref 4.20–5.82)
RDW: 13.9 % (ref 11.0–14.6)
WBC: 8.1 10*3/uL (ref 4.0–10.3)

## 2016-03-19 LAB — COMPREHENSIVE METABOLIC PANEL
ALBUMIN: 3.6 g/dL (ref 3.5–5.0)
ALK PHOS: 94 U/L (ref 40–150)
ALT: 16 U/L (ref 0–55)
ANION GAP: 8 meq/L (ref 3–11)
AST: 17 U/L (ref 5–34)
BUN: 36.7 mg/dL — AB (ref 7.0–26.0)
CALCIUM: 9 mg/dL (ref 8.4–10.4)
CHLORIDE: 107 meq/L (ref 98–109)
CO2: 26 mEq/L (ref 22–29)
Creatinine: 1.5 mg/dL — ABNORMAL HIGH (ref 0.7–1.3)
EGFR: 42 mL/min/{1.73_m2} — AB (ref >90)
Glucose: 93 mg/dl (ref 70–140)
POTASSIUM: 4.2 meq/L (ref 3.5–5.1)
Sodium: 141 mEq/L (ref 136–145)
Total Bilirubin: 0.31 mg/dL (ref 0.20–1.20)
Total Protein: 7.1 g/dL (ref 6.4–8.3)

## 2016-03-19 MED ORDER — TECHNETIUM TC 99M MEDRONATE IV KIT
26.4000 | PACK | Freq: Once | INTRAVENOUS | Status: AC | PRN
  Administered 2016-03-19: 26.4 via INTRAVENOUS

## 2016-03-20 LAB — PSA: Prostate Specific Ag, Serum: 6.7 ng/mL — ABNORMAL HIGH (ref 0.0–4.0)

## 2016-03-26 ENCOUNTER — Ambulatory Visit (HOSPITAL_BASED_OUTPATIENT_CLINIC_OR_DEPARTMENT_OTHER): Payer: Medicare HMO

## 2016-03-26 ENCOUNTER — Telehealth: Payer: Self-pay | Admitting: Oncology

## 2016-03-26 ENCOUNTER — Ambulatory Visit (HOSPITAL_BASED_OUTPATIENT_CLINIC_OR_DEPARTMENT_OTHER): Payer: Medicare HMO | Admitting: Oncology

## 2016-03-26 VITALS — BP 165/66 | HR 56 | Temp 98.2°F | Resp 17 | Ht 68.0 in

## 2016-03-26 DIAGNOSIS — C61 Malignant neoplasm of prostate: Secondary | ICD-10-CM

## 2016-03-26 DIAGNOSIS — I5030 Unspecified diastolic (congestive) heart failure: Secondary | ICD-10-CM | POA: Diagnosis not present

## 2016-03-26 DIAGNOSIS — I10 Essential (primary) hypertension: Secondary | ICD-10-CM

## 2016-03-26 DIAGNOSIS — C7951 Secondary malignant neoplasm of bone: Secondary | ICD-10-CM | POA: Diagnosis not present

## 2016-03-26 DIAGNOSIS — Z5111 Encounter for antineoplastic chemotherapy: Secondary | ICD-10-CM | POA: Diagnosis not present

## 2016-03-26 MED ORDER — DENOSUMAB 120 MG/1.7ML ~~LOC~~ SOLN
120.0000 mg | Freq: Once | SUBCUTANEOUS | Status: AC
  Administered 2016-03-26: 120 mg via SUBCUTANEOUS
  Filled 2016-03-26: qty 1.7

## 2016-03-26 MED ORDER — LEUPROLIDE ACETATE (3 MONTH) 22.5 MG IM KIT
22.5000 mg | PACK | Freq: Once | INTRAMUSCULAR | Status: AC
  Administered 2016-03-26: 22.5 mg via INTRAMUSCULAR
  Filled 2016-03-26: qty 22.5

## 2016-03-26 NOTE — Progress Notes (Signed)
[facility] PROGRESS NOTE 03/26/2016   Clayton [name], MD [place] [address]  DIAGNOSIS: 82 year old  gentleman with hormone sensitive metastatic prostate cancer diagnosed in June 2015. He presented with bony metastasis and a PSA of 70.23.   CURRENT TREATMENT:   Lupron 22.5 mg q 3 months started on 06/05/2014. This will be given every 3 months. Completed one month of casodex 50 mg daily on 06/29/2014.  Xgeva given in September 2015. This will be resumed on May 2017.  INTERVAL HISTORY: Clayton [name] presents today for a follow-up visit with his family. Since the last visit, he reports doing very well. He has limited quality of life but this remains stable. His appetite have not changed and does not report any pain. He is very hard of hearing and most of the history provided by his wife. He does not report any discomfort or any complaints. He has not had any hospitalization or illnesses. His mobility is limited and currently in a chair most of the day. He reports no issues with receiving Lupron every 3 months.    He does not report any headaches or blurry vision or syncope. He does not report any fevers, chills, sweats or weight loss. He does not report any chest pain, palpitation orthopnea or dyspnea on exertion. He does not report any cough or hemoptysis or wheezing. Does not report any nausea, vomiting, abdominal pain. He does not report any constipation or diarrhea. Does not report any hematuria but does report nocturia and frequency. Rest of his review of systems unremarkable.  MEDICAL HISTORY: Past Medical History  Diagnosis Date  . CAD (coronary artery disease)     s/p CABG 1999  . HLD (hyperlipidemia)   . HTN (hypertension)   . GERD (gastroesophageal reflux disease)   . H/O hiatal hernia   . Arthritis   . Peripheral vascular disease (HCC)     aneursym  . Aortic aneurysm (HCC)     at least 3 cmm infrarenal AAA by lumbar CT 05/22/13  [facility]); 41 mm proximal ascending aorta on echo 05/05/13 [facility])     ALLERGIES:  is allergic to tetanus toxoids.  MEDICATIONS:  Current Outpatient Prescriptions  Medication Sig Dispense Refill  . amoxicillin (AMOXIL) 500 MG tablet Take 4 tablets by mouth one hour prior to dental appointment 8 tablet 2  . aspirin EC 81 MG tablet Take 81 mg by mouth daily.    . cyanocobalamin (,VITAMIN B-12,) 1000 MCG/ML injection Inject 1,000 mcg into the muscle See admin instructions. 1000 mcg IM daily for 5 days (received 7/7-7/11), then weekly for 3 weeks (first dose due 7/18), then monthly    . denosumab (XGEVA) 120 MG/1.7ML SOLN injection Inject 120 mg into the skin once.    . donepezil (ARICEPT) 5 MG tablet Take 5 mg by mouth at bedtime.     Marland Kitchen latanoprost (XALATAN) 0.005 % ophthalmic solution Place 1 drop into both eyes at bedtime.     Marland Kitchen leuprolide (LUPRON) 22.5 MG injection Inject 22.5 mg into the muscle every 3 (three) months.    Marland Kitchen lisinopril (PRINIVIL,ZESTRIL) 10 MG tablet Take 1 tablet by mouth daily.    . metoprolol (LOPRESSOR) 100 MG tablet Take 100 mg by mouth daily.     . metoprolol (LOPRESSOR) 50 MG tablet Take 50 mg by mouth at bedtime.    . multivitamin-lutein (OCUVITE-LUTEIN) CAPS Take 1 capsule by mouth 2 (two) times daily.      Marland Kitchen omeprazole (  PRILOSEC) 20 MG capsule Take 20 mg by mouth daily.     . Tamsulosin HCl (FLOMAX) 0.4 MG CAPS Take 0.4 mg by mouth at bedtime.     Marland Kitchen warfarin (COUMADIN) 1 MG tablet Take 7 mg by mouth daily. Pt takes  5 mg and 2 1 mg tablets to equal a total of 7 mg on Sunday Thursday Saturday    . warfarin (COUMADIN) 4 MG tablet Take 4 mg by mouth daily.     No current facility-administered medications for this visit.    ECOG PERFORMANCE STATUS:  3  Blood pressure 165/66, pulse 56, temperature 98.2 F (36.8 C), temperature source Oral, resp. rate 17, height 5\' 8"  (1.727 m), SpO2 99 %.  GENERAL:chronically ill appearing in a wheelchair  Appeared comfortable. SKIN: No rashes or significant lesions  EYES: Sclerae are anicteric. OROPHARYNX:. No oral ulcers or lesions. NECK: supple, thyroid normal size, non-tender. No lymphadenopathy noted. LYMPH: no palpable lymphadenopathy in the cervical, axillary or inguinal  LUNGS: clear to auscultation and percussion with normal breathing effort  HEART: regular rate & rhythm and no murmurs and no lower extremity edema  ABDOMEN:abdomen soft, non-tender and normal bowel sounds  no rebound or guarding. Musculoskeletal:no cyanosis of digits and no clubbing  NEURO: No focal deficits noted.  Labs:  Lab Results  Component Value Date   WBC 8.1 03/19/2016   HGB 12.5* 03/19/2016   HCT 38.5 03/19/2016   MCV 93.0 03/19/2016   PLT 181 03/19/2016   NEUTROABS 5.1 03/19/2016      Chemistry      Component Value Date/Time   NA 141 03/19/2016 1233   NA 138 02/22/2015 0927   K 4.2 03/19/2016 1233   K 4.4 02/22/2015 0927   CL 103 02/22/2015 0927   CO2 26 03/19/2016 1233   CO2 28 02/22/2015 0927   BUN 36.7* 03/19/2016 1233   BUN 25* 02/22/2015 0927   CREATININE 1.5* 03/19/2016 1233   CREATININE 1.22 02/22/2015 0927      Component Value Date/Time   CALCIUM 9.0 03/19/2016 1233   CALCIUM 8.3* 02/22/2015 0927   ALKPHOS 94 03/19/2016 1233   ALKPHOS 66 02/22/2015 0927   AST 17 03/19/2016 1233   AST 22 02/22/2015 0927   ALT 16 03/19/2016 1233   ALT 17 02/22/2015 0927   BILITOT 0.31 03/19/2016 1233   BILITOT 0.6 02/22/2015 0927     Results for HALSEY, CRUMEDY (MRN [id number]) as of 03/26/2016 14:42  Ref. Range 12/19/2015 14:48 03/19/2016 12:33  PSA Latest Ref Range: 0.0-4.0 ng/mL 2.7 6.7 (H)    IMPRESSION: 1. New foci of increased uptake in the posterior aspect of T4, L5, and posterior aspect of the left iliac bone. These findings may reflect metastatic disease. 2. Stable increased uptake at T9, L2 and L3, and about the right hip.     ASSESSMENT AND PLAN : Clayton [name] 82 y.o. male with:     1. Metastatic Prostate cancer. His disease appears to be hormone sensitive with PSA dropping from 70.23 down to 1.14 with Lupron only. His CT scan in February 2016 showed metastatic disease to the bone but no visceral metastasis.   He is currently on Lupron and have tolerated it well. His PSA most recently did go up to 6.7 and bone scan showed overall stable disease. Given his overall poor quality of life and relative lack of symptoms, the plan is to continue with the same treatment plan for the time being. Different salvage therapy can  be used if he becomes more symptomatic. He understands the goal of therapy at this time is palliative.    2. Pathologic R. Hip fracture(avulsion, less trochanteric) s/p R ORIF (05/10/2014). Not active at this time without any new fractures or dislocations.  3. Bone directed therapy: He is okay to resume Xgeva at this time. I see no objections at this time. Complications such as hypokalemia and osteonecrosis of the jaw were reviewed.  4. AVR s/p mechanical valve replacement: Anticoagulation resumed with Coumadin. No bleeding complications noted.  5. Diastolic CHF/HTN/CAD: follows up with his primary care physician and cardiologist. regarding this issue.  6. Anemia: This have resolved at this time with a normal hemoglobin.  7. Follow-up: Will be in 3 months for Lupron and Xgeva injections.   [facility], MD 03/26/2016

## 2016-03-26 NOTE — Telephone Encounter (Signed)
per pof to sch pt appt-gave pt copy of avs °

## 2016-03-26 NOTE — Telephone Encounter (Signed)
Gave pt apt & avs °

## 2016-03-26 NOTE — Patient Instructions (Signed)
Denosumab injection What is this medicine? DENOSUMAB (den oh sue mab) slows bone breakdown. Prolia is used to treat osteoporosis in women after menopause and in men. Xgeva is used to prevent bone fractures and other bone problems caused by cancer bone metastases. Xgeva is also used to treat giant cell tumor of the bone. This medicine may be used for other purposes; ask your health care provider or pharmacist if you have questions. COMMON BRAND NAME(S): Prolia, XGEVA What should I tell my health care provider before I take this medicine? They need to know if you have any of these conditions: -dental disease -eczema -infection or history of infections -kidney disease or on dialysis -low blood calcium or vitamin D -malabsorption syndrome -scheduled to have surgery or tooth extraction -taking medicine that contains denosumab -thyroid or parathyroid disease -an unusual reaction to denosumab, other medicines, foods, dyes, or preservatives -pregnant or trying to get pregnant -breast-feeding How should I use this medicine? This medicine is for injection under the skin. It is given by a health care professional in a hospital or clinic setting. If you are getting Prolia, a special MedGuide will be given to you by the pharmacist with each prescription and refill. Be sure to read this information carefully each time. For Prolia, talk to your pediatrician regarding the use of this medicine in children. Special care may be needed. For Xgeva, talk to your pediatrician regarding the use of this medicine in children. While this drug may be prescribed for children as young as 13 years for selected conditions, precautions do apply. Overdosage: If you think you've taken too much of this medicine contact a poison control center or emergency room at once. Overdosage: If you think you have taken too much of this medicine contact a poison control center or emergency room at once. NOTE: This medicine is only for  you. Do not share this medicine with others. What if I miss a dose? It is important not to miss your dose. Call your doctor or health care professional if you are unable to keep an appointment. What may interact with this medicine? Do not take this medicine with any of the following medications: -other medicines containing denosumab This medicine may also interact with the following medications: -medicines that suppress the immune system -medicines that treat cancer -steroid medicines like prednisone or cortisone This list may not describe all possible interactions. Give your health care provider a list of all the medicines, herbs, non-prescription drugs, or dietary supplements you use. Also tell them if you smoke, drink alcohol, or use illegal drugs. Some items may interact with your medicine. What should I watch for while using this medicine? Visit your doctor or health care professional for regular checks on your progress. Your doctor or health care professional may order blood tests and other tests to see how you are doing. Call your doctor or health care professional if you get a cold or other infection while receiving this medicine. Do not treat yourself. This medicine may decrease your body's ability to fight infection. You should make sure you get enough calcium and vitamin D while you are taking this medicine, unless your doctor tells you not to. Discuss the foods you eat and the vitamins you take with your health care professional. See your dentist regularly. Brush and floss your teeth as directed. Before you have any dental work done, tell your dentist you are receiving this medicine. Do not become pregnant while taking this medicine or for 5 months after stopping   it. Women should inform their doctor if they wish to become pregnant or think they might be pregnant. There is a potential for serious side effects to an unborn child. Talk to your health care professional or pharmacist for more  information. What side effects may I notice from receiving this medicine? Side effects that you should report to your doctor or health care professional as soon as possible: -allergic reactions like skin rash, itching or hives, swelling of the face, lips, or tongue -breathing problems -chest pain -fast, irregular heartbeat -feeling faint or lightheaded, falls -fever, chills, or any other sign of infection -muscle spasms, tightening, or twitches -numbness or tingling -skin blisters or bumps, or is dry, peels, or red -slow healing or unexplained pain in the mouth or jaw -unusual bleeding or bruising Side effects that usually do not require medical attention (Report these to your doctor or health care professional if they continue or are bothersome.): -muscle pain -stomach upset, gas This list may not describe all possible side effects. Call your doctor for medical advice about side effects. You may report side effects to FDA at 1-800-FDA-1088. Where should I keep my medicine? This medicine is only given in a clinic, doctor's office, or other health care setting and will not be stored at home. NOTE: This sheet is a summary. It may not cover all possible information. If you have questions about this medicine, talk to your doctor, pharmacist, or health care provider.  2015, Elsevier/Gold Standard. (2012-04-25 12:37:47) Leuprolide depot injection or implant What is this medicine? LEUPROLIDE (loo PROE lide) is a man-made protein that acts like a natural hormone in the body. It decreases testosterone in men and decreases estrogen in women. In men, this medicine is used to treat advanced prostate cancer. In women, some forms of this medicine may be used to treat endometriosis, uterine fibroids, or other male hormone-related problems. This medicine may be used for other purposes; ask your health care provider or pharmacist if you have questions. COMMON BRAND NAME(S): Eligard, Lupron Depot, Lupron  Depot-Ped, Viadur What should I tell my health care provider before I take this medicine? They need to know if you have any of these conditions: -diabetes -heart disease or previous heart attack -high blood pressure -high cholesterol -osteoporosis -pain or difficulty passing urine -spinal cord metastasis -stroke -tobacco smoker -unusual vaginal bleeding (women) -an unusual or allergic reaction to leuprolide, benzyl alcohol, other medicines, foods, dyes, or preservatives -pregnant or trying to get pregnant -breast-feeding How should I use this medicine? This medicine is for injection into a muscle or for implant or injection under the skin. It is given by a health care professional in a hospital or clinic setting. The specific product will determine how it will be given to you. Make sure you understand which product you receive and how often you will receive it. Talk to your pediatrician regarding the use of this medicine in children. Special care may be needed. Overdosage: If you think you have taken too much of this medicine contact a poison control center or emergency room at once. NOTE: This medicine is only for you. Do not share this medicine with others. What if I miss a dose? It is important not to miss a dose. Call your doctor or health care professional if you are unable to keep an appointment. Depot injections: Depot injections are given either once-monthly, every 12 weeks, every 16 weeks, or every 24 weeks depending on the product you are prescribed. The product you are prescribed  will be based on if you are male or male, and your condition. Make sure you understand your product and dosing. Implant dosing: The implant is removed and replaced once a year. The implant is only used in males. What may interact with this medicine? Do not take this medicine with any of the following medications: -chasteberry This medicine may also interact with the following medications: -herbal or  dietary supplements, like black cohosh or DHEA -male hormones, like estrogens or progestins and birth control pills, patches, rings, or injections -male hormones, like testosterone This list may not describe all possible interactions. Give your health care provider a list of all the medicines, herbs, non-prescription drugs, or dietary supplements you use. Also tell them if you smoke, drink alcohol, or use illegal drugs. Some items may interact with your medicine. What should I watch for while using this medicine? Visit your doctor or health care professional for regular checks on your progress. During the first weeks of treatment, your symptoms may get worse, but then will improve as you continue your treatment. You may get hot flashes, increased bone pain, increased difficulty passing urine, or an aggravation of nerve symptoms. Discuss these effects with your doctor or health care professional, some of them may improve with continued use of this medicine. Male patients may experience a menstrual cycle or spotting during the first months of therapy with this medicine. If this continues, contact your doctor or health care professional. What side effects may I notice from receiving this medicine? Side effects that you should report to your doctor or health care professional as soon as possible: -allergic reactions like skin rash, itching or hives, swelling of the face, lips, or tongue -breathing problems -chest pain -depression or memory disorders -pain in your legs or groin -pain at site where injected or implanted -severe headache -swelling of the feet and legs -visual changes -vomiting Side effects that usually do not require medical attention (report to your doctor or health care professional if they continue or are bothersome): -breast swelling or tenderness -decrease in sex drive or performance -diarrhea -hot flashes -loss of appetite -muscle, joint, or bone pains -nausea -redness  or irritation at site where injected or implanted -skin problems or acne This list may not describe all possible side effects. Call your doctor for medical advice about side effects. You may report side effects to FDA at 1-800-FDA-1088. Where should I keep my medicine? This drug is given in a hospital or clinic and will not be stored at home. NOTE: This sheet is a summary. It may not cover all possible information. If you have questions about this medicine, talk to your doctor, pharmacist, or health care provider.  2015, Elsevier/Gold Standard. (2010-04-29 14:41:21) Leuprolide depot injection What is this medicine? LEUPROLIDE (loo PROE lide) is a man-made protein that acts like a natural hormone in the body. It decreases testosterone in men and decreases estrogen in women. In men, this medicine is used to treat advanced prostate cancer. In women, some forms of this medicine may be used to treat endometriosis, uterine fibroids, or other male hormone-related problems. This medicine may be used for other purposes; ask your health care provider or pharmacist if you have questions. What should I tell my health care provider before I take this medicine? They need to know if you have any of these conditions: -diabetes -heart disease or previous heart attack -high blood pressure -high cholesterol -osteoporosis -pain or difficulty passing urine -spinal cord metastasis -stroke -tobacco smoker -unusual vaginal   bleeding (women) -an unusual or allergic reaction to leuprolide, benzyl alcohol, other medicines, foods, dyes, or preservatives -pregnant or trying to get pregnant -breast-feeding How should I use this medicine? This medicine is for injection into a muscle or for injection under the skin. It is given by a health care professional in a hospital or clinic setting. The specific product will determine how it will be given to you. Make sure you understand which product you receive and how often  you will receive it. Talk to your pediatrician regarding the use of this medicine in children. Special care may be needed. Overdosage: If you think you have taken too much of this medicine contact a poison control center or emergency room at once. NOTE: This medicine is only for you. Do not share this medicine with others. What if I miss a dose? It is important not to miss a dose. Call your doctor or health care professional if you are unable to keep an appointment. Depot injections: Depot injections are given either once-monthly, every 12 weeks, every 16 weeks, or every 24 weeks depending on the product you are prescribed. The product you are prescribed will be based on if you are male or male, and your condition. Make sure you understand your product and dosing. What may interact with this medicine? Do not take this medicine with any of the following medications: -chasteberry This medicine may also interact with the following medications: -herbal or dietary supplements, like black cohosh or DHEA -male hormones, like estrogens or progestins and birth control pills, patches, rings, or injections -male hormones, like testosterone This list may not describe all possible interactions. Give your health care provider a list of all the medicines, herbs, non-prescription drugs, or dietary supplements you use. Also tell them if you smoke, drink alcohol, or use illegal drugs. Some items may interact with your medicine. What should I watch for while using this medicine? Visit your doctor or health care professional for regular checks on your progress. During the first weeks of treatment, your symptoms may get worse, but then will improve as you continue your treatment. You may get hot flashes, increased bone pain, increased difficulty passing urine, or an aggravation of nerve symptoms. Discuss these effects with your doctor or health care professional, some of them may improve with continued use of this  medicine. Male patients may experience a menstrual cycle or spotting during the first months of therapy with this medicine. If this continues, contact your doctor or health care professional. What side effects may I notice from receiving this medicine? Side effects that you should report to your doctor or health care professional as soon as possible: -allergic reactions like skin rash, itching or hives, swelling of the face, lips, or tongue -breathing problems -chest pain -depression or memory disorders -pain in your legs or groin -pain at site where injected or implanted -severe headache -swelling of the feet and legs -visual changes -vomiting Side effects that usually do not require medical attention (report to your doctor or health care professional if they continue or are bothersome): -breast swelling or tenderness -decrease in sex drive or performance -diarrhea -hot flashes -loss of appetite -muscle, joint, or bone pains -nausea -redness or irritation at site where injected or implanted -skin problems or acne This list may not describe all possible side effects. Call your doctor for medical advice about side effects. You may report side effects to FDA at 1-800-FDA-1088. Where should I keep my medicine? This drug is given in a  hospital or clinic and will not be stored at home. NOTE: This sheet is a summary. It may not cover all possible information. If you have questions about this medicine, talk to your doctor, pharmacist, or health care provider.    2016, Elsevier/Gold Standard. (2014-07-20 14:16:23)

## 2016-06-25 ENCOUNTER — Ambulatory Visit (HOSPITAL_BASED_OUTPATIENT_CLINIC_OR_DEPARTMENT_OTHER): Payer: Medicare HMO | Admitting: Oncology

## 2016-06-25 ENCOUNTER — Other Ambulatory Visit (HOSPITAL_BASED_OUTPATIENT_CLINIC_OR_DEPARTMENT_OTHER): Payer: Medicare HMO

## 2016-06-25 ENCOUNTER — Telehealth: Payer: Self-pay | Admitting: Oncology

## 2016-06-25 ENCOUNTER — Ambulatory Visit: Payer: Medicare HMO

## 2016-06-25 VITALS — BP 145/62 | HR 64 | Temp 98.0°F | Resp 18 | Ht 68.0 in | Wt 199.2 lb

## 2016-06-25 DIAGNOSIS — I5030 Unspecified diastolic (congestive) heart failure: Secondary | ICD-10-CM

## 2016-06-25 DIAGNOSIS — I10 Essential (primary) hypertension: Secondary | ICD-10-CM

## 2016-06-25 DIAGNOSIS — C7951 Secondary malignant neoplasm of bone: Secondary | ICD-10-CM | POA: Diagnosis not present

## 2016-06-25 DIAGNOSIS — D649 Anemia, unspecified: Secondary | ICD-10-CM

## 2016-06-25 DIAGNOSIS — Z5111 Encounter for antineoplastic chemotherapy: Secondary | ICD-10-CM | POA: Diagnosis not present

## 2016-06-25 DIAGNOSIS — C61 Malignant neoplasm of prostate: Secondary | ICD-10-CM

## 2016-06-25 LAB — CBC WITH DIFFERENTIAL/PLATELET
BASO%: 0.7 % (ref 0.0–2.0)
Basophils Absolute: 0.1 10*3/uL (ref 0.0–0.1)
EOS ABS: 0.3 10*3/uL (ref 0.0–0.5)
EOS%: 3.3 % (ref 0.0–7.0)
HEMATOCRIT: 37.4 % — AB (ref 38.4–49.9)
HGB: 12.2 g/dL — ABNORMAL LOW (ref 13.0–17.1)
LYMPH#: 1.9 10*3/uL (ref 0.9–3.3)
LYMPH%: 21.9 % (ref 14.0–49.0)
MCH: 30.1 pg (ref 27.2–33.4)
MCHC: 32.6 g/dL (ref 32.0–36.0)
MCV: 92.5 fL (ref 79.3–98.0)
MONO#: 0.8 10*3/uL (ref 0.1–0.9)
MONO%: 9.6 % (ref 0.0–14.0)
NEUT%: 64.5 % (ref 39.0–75.0)
NEUTROS ABS: 5.6 10*3/uL (ref 1.5–6.5)
PLATELETS: 191 10*3/uL (ref 140–400)
RBC: 4.04 10*6/uL — ABNORMAL LOW (ref 4.20–5.82)
RDW: 14.4 % (ref 11.0–14.6)
WBC: 8.7 10*3/uL (ref 4.0–10.3)

## 2016-06-25 LAB — COMPREHENSIVE METABOLIC PANEL
ALT: 13 U/L (ref 0–55)
ANION GAP: 8 meq/L (ref 3–11)
AST: 19 U/L (ref 5–34)
Albumin: 3.4 g/dL — ABNORMAL LOW (ref 3.5–5.0)
Alkaline Phosphatase: 95 U/L (ref 40–150)
BILIRUBIN TOTAL: 0.58 mg/dL (ref 0.20–1.20)
BUN: 30.6 mg/dL — ABNORMAL HIGH (ref 7.0–26.0)
CALCIUM: 8.7 mg/dL (ref 8.4–10.4)
CO2: 25 mEq/L (ref 22–29)
CREATININE: 1.6 mg/dL — AB (ref 0.7–1.3)
Chloride: 108 mEq/L (ref 98–109)
EGFR: 39 mL/min/{1.73_m2} — AB (ref >90)
Glucose: 100 mg/dl (ref 70–140)
Potassium: 4.2 mEq/L (ref 3.5–5.1)
Sodium: 141 mEq/L (ref 136–145)
TOTAL PROTEIN: 6.8 g/dL (ref 6.4–8.3)

## 2016-06-25 MED ORDER — DENOSUMAB 120 MG/1.7ML ~~LOC~~ SOLN
120.0000 mg | Freq: Once | SUBCUTANEOUS | Status: AC
  Administered 2016-06-25: 120 mg via SUBCUTANEOUS
  Filled 2016-06-25: qty 1.7

## 2016-06-25 MED ORDER — LEUPROLIDE ACETATE (3 MONTH) 22.5 MG IM KIT
22.5000 mg | PACK | Freq: Once | INTRAMUSCULAR | Status: AC
  Administered 2016-06-25: 22.5 mg via INTRAMUSCULAR
  Filled 2016-06-25: qty 22.5

## 2016-06-25 NOTE — Progress Notes (Signed)
[facility] PROGRESS NOTE 06/25/16   [name] [address]  DIAGNOSIS: 82 year old man with hormone sensitive metastatic prostate cancer diagnosed in June 2015. He presented with bony metastasis and a PSA of 70.23.   CURRENT TREATMENT:   Lupron 22.5 mg q 3 months started on 06/05/2014. This will be given every 3 months. Completed one month of casodex 50 mg daily on 06/29/2014.  Xgeva given in September 2015. This will be resumed on May 2017.  INTERVAL HISTORY: Mr. Bolsinger presents today for a follow-up visit with his family. Since the last visit, he reports no major changes in his health. He reports no dramatic increase in his pain and uses Tylenol periodically to control. His appetite have not changed dramatically in of actually gained weight.   He has limited quality of life but this remains stable.He is very hard of hearing and most of the history provided by his wife. He is blind and limited with his activity related to that and arthritis. He denied any complications related to Lupron or Xgeva. He does not report any pathological fractures are increased in his bone pain. He does not report any urination or defecation issues.   He does not report any headaches or blurry vision or syncope. He does not report any fevers, chills, sweats or weight loss. He does not report any chest pain, palpitation orthopnea or dyspnea on exertion. He does not report any cough or hemoptysis or wheezing. Does not report any nausea, vomiting, abdominal pain. He does not report any constipation or diarrhea. Does not report any hematuria but does report nocturia and frequency. Rest of his review of systems unremarkable.  MEDICAL HISTORY: Past Medical History:  Diagnosis Date  . Aortic aneurysm (HCC)    at least 3 cmm infrarenal AAA by lumbar CT 05/22/13 [facility]); 41 mm proximal ascending aorta on echo 05/05/13 [facility])  . Arthritis    . CAD (coronary artery disease)    s/p CABG 1999  . GERD (gastroesophageal reflux disease)   . H/O hiatal hernia   . HLD (hyperlipidemia)   . HTN (hypertension)   . Peripheral vascular disease ([place])    aneursym     ALLERGIES:  is allergic to tetanus toxoids.  MEDICATIONS:  Current Outpatient Prescriptions  Medication Sig Dispense Refill  . amoxicillin (AMOXIL) 500 MG tablet Take 4 tablets by mouth one hour prior to dental appointment 8 tablet 2  . aspirin EC 81 MG tablet Take 81 mg by mouth daily.    . cyanocobalamin (,VITAMIN B-12,) 1000 MCG/ML injection Inject 1,000 mcg into the muscle See admin instructions. 1000 mcg IM daily for 5 days (received 7/7-7/11), then weekly for 3 weeks (first dose due 7/18), then monthly    . denosumab (XGEVA) 120 MG/1.7ML SOLN injection Inject 120 mg into the skin once.    . donepezil (ARICEPT) 5 MG tablet Take 5 mg by mouth at bedtime.     Marland Kitchen latanoprost (XALATAN) 0.005 % ophthalmic solution Place 1 drop into both eyes at bedtime.     Marland Kitchen leuprolide (LUPRON) 22.5 MG injection Inject 22.5 mg into the muscle every 3 (three) months.    Marland Kitchen lisinopril (PRINIVIL,ZESTRIL) 10 MG tablet Take 1 tablet by mouth daily.    . metoprolol (LOPRESSOR) 100 MG tablet Take 100 mg by mouth daily.     . metoprolol (LOPRESSOR) 50 MG tablet Take 50 mg by mouth at bedtime.    . multivitamin-lutein (OCUVITE-LUTEIN)  CAPS Take 1 capsule by mouth 2 (two) times daily.      Marland Kitchen omeprazole (PRILOSEC) 20 MG capsule Take 20 mg by mouth daily.     . Tamsulosin HCl (FLOMAX) 0.4 MG CAPS Take 0.4 mg by mouth at bedtime.     Marland Kitchen warfarin (COUMADIN) 1 MG tablet Take 7 mg by mouth daily. Pt takes  5 mg and 2 1 mg tablets to equal a total of 7 mg on Sunday Thursday Saturday    . warfarin (COUMADIN) 4 MG tablet Take 4 mg by mouth daily.     No current facility-administered medications for this visit.     ECOG PERFORMANCE STATUS:  3  Blood pressure (!) 145/62, pulse 64, temperature 98 F (36.7  C), temperature source Oral, resp. rate 18, height 5\' 8"  (1.727 m), weight 199 lb 3.2 oz (90.4 kg), SpO2 98 %.  GENERAL: Elderly gentleman appeared without distress. SKIN: No rashes or significant lesions masses. EYES: Sclerae are anicteric. OROPHARYNX:. No oral thrush noted. NECK: supple, thyroid normal size.  LYMPH: no palpable lymphadenopathy in the cervical, axillary or inguinal  LUNGS: clear to auscultation and percussion with normal breathing effort  HEART: regular rate & rhythm and no murmurs and no lower extremity edema  ABDOMEN:abdomen soft, non-tender and normal bowel sounds. No shifting dullness or ascites. Musculoskeletal:no cyanosis of digits and no clubbing  NEURO: No focal deficits noted.  Labs:  Lab Results  Component Value Date   WBC 8.7 06/25/2016   HGB 12.2 (L) 06/25/2016   HCT 37.4 (L) 06/25/2016   MCV 92.5 06/25/2016   PLT 191 06/25/2016   NEUTROABS 5.6 06/25/2016      Chemistry      Component Value Date/Time   NA 141 06/25/2016 1357   K 4.2 06/25/2016 1357   CL 103 02/22/2015 0927   CO2 25 06/25/2016 1357   BUN 30.6 (H) 06/25/2016 1357   CREATININE 1.6 (H) 06/25/2016 1357      Component Value Date/Time   CALCIUM 8.7 06/25/2016 1357   ALKPHOS 95 06/25/2016 1357   AST 19 06/25/2016 1357   ALT 13 06/25/2016 1357   BILITOT 0.58 06/25/2016 1357     Results for KANALOA, LACHANCE (MRN [id number]) as of 06/25/2016 14:25  Ref. Range 12/19/2015 14:48 12/19/2015 14:48 03/19/2016 12:33  PSA Latest Ref Range: 0.0 - 4.0 ng/mL 2.62 2.7 6.7 (H)     ASSESSMENT AND PLAN : Koray T Harmsen 82 y.o. male with:    1. Metastatic Prostate cancer. His disease appears to be hormone sensitive with PSA dropping from 70.23 down to 1.14 with Lupron only. His CT scan in February 2016 showed metastatic disease to the bone but no visceral metastasis.   He is currently on Lupron and have tolerated it well. His PSA was 6.7 in May 2017 and his bone scan was relatively stable. He appears to be  clinically stable at this time although he might be developing castration resistant disease. Different salvage therapy will be used if he becomes symptomatic from his disease. I will be in the form of Zytiga or Xtandi. He is not a chemotherapy candidate at this time.   2. Pathologic R. Hip fracture(avulsion, less trochanteric) s/p R ORIF (05/10/2014). Not active at this time without any new fractures or dislocations.  3. Bone directed therapy: He is okay to resume Xgeva at this time. I see no objections at this time. Complications such as hypokalemia and osteonecrosis of the jaw were reviewed again and he  is agreeable to continue.  4. AVR s/p mechanical valve replacement: Anticoagulation resumed with Coumadin. No bleeding complications noted.  5. Diastolic CHF/HTN/CAD: follows up with his primary care physician and cardiologist. regarding this issue.  6. Anemia: His hemoglobin continues to be normal at this time.  7. Follow-up: Will be in 3 months for Lupron and Xgeva injections.   [facility], MD 06/25/16

## 2016-06-25 NOTE — Progress Notes (Signed)
Injections were given by Randolm Idol, RN

## 2016-06-25 NOTE — Telephone Encounter (Signed)
Gv pt appt for 10/08/16.

## 2016-06-26 LAB — PSA: Prostate Specific Ag, Serum: 14 ng/mL — ABNORMAL HIGH (ref 0.0–4.0)

## 2016-10-08 ENCOUNTER — Other Ambulatory Visit (HOSPITAL_BASED_OUTPATIENT_CLINIC_OR_DEPARTMENT_OTHER): Payer: Medicare HMO

## 2016-10-08 ENCOUNTER — Ambulatory Visit (HOSPITAL_BASED_OUTPATIENT_CLINIC_OR_DEPARTMENT_OTHER): Payer: Medicare HMO | Admitting: Oncology

## 2016-10-08 ENCOUNTER — Telehealth: Payer: Self-pay | Admitting: Oncology

## 2016-10-08 ENCOUNTER — Ambulatory Visit (HOSPITAL_BASED_OUTPATIENT_CLINIC_OR_DEPARTMENT_OTHER): Payer: Medicare HMO

## 2016-10-08 VITALS — BP 126/58 | HR 59 | Temp 97.7°F | Resp 18 | Ht 68.0 in

## 2016-10-08 DIAGNOSIS — I5030 Unspecified diastolic (congestive) heart failure: Secondary | ICD-10-CM

## 2016-10-08 DIAGNOSIS — I10 Essential (primary) hypertension: Secondary | ICD-10-CM | POA: Diagnosis not present

## 2016-10-08 DIAGNOSIS — C7951 Secondary malignant neoplasm of bone: Secondary | ICD-10-CM

## 2016-10-08 DIAGNOSIS — C61 Malignant neoplasm of prostate: Secondary | ICD-10-CM | POA: Diagnosis not present

## 2016-10-08 DIAGNOSIS — Z5111 Encounter for antineoplastic chemotherapy: Secondary | ICD-10-CM | POA: Diagnosis not present

## 2016-10-08 DIAGNOSIS — D649 Anemia, unspecified: Secondary | ICD-10-CM

## 2016-10-08 LAB — CBC WITH DIFFERENTIAL/PLATELET
BASO%: 0.2 % (ref 0.0–2.0)
Basophils Absolute: 0 10*3/uL (ref 0.0–0.1)
EOS%: 2.4 % (ref 0.0–7.0)
Eosinophils Absolute: 0.3 10*3/uL (ref 0.0–0.5)
HEMATOCRIT: 36 % — AB (ref 38.4–49.9)
HEMOGLOBIN: 11.6 g/dL — AB (ref 13.0–17.1)
LYMPH#: 1.8 10*3/uL (ref 0.9–3.3)
LYMPH%: 16.2 % (ref 14.0–49.0)
MCH: 29.9 pg (ref 27.2–33.4)
MCHC: 32.2 g/dL (ref 32.0–36.0)
MCV: 92.8 fL (ref 79.3–98.0)
MONO#: 1 10*3/uL — AB (ref 0.1–0.9)
MONO%: 8.9 % (ref 0.0–14.0)
NEUT%: 72.3 % (ref 39.0–75.0)
NEUTROS ABS: 8 10*3/uL — AB (ref 1.5–6.5)
PLATELETS: 192 10*3/uL (ref 140–400)
RBC: 3.88 10*6/uL — ABNORMAL LOW (ref 4.20–5.82)
RDW: 13.8 % (ref 11.0–14.6)
WBC: 11.1 10*3/uL — ABNORMAL HIGH (ref 4.0–10.3)

## 2016-10-08 LAB — COMPREHENSIVE METABOLIC PANEL
ALBUMIN: 3.2 g/dL — AB (ref 3.5–5.0)
ALT: 17 U/L (ref 0–55)
ANION GAP: 7 meq/L (ref 3–11)
AST: 20 U/L (ref 5–34)
Alkaline Phosphatase: 156 U/L — ABNORMAL HIGH (ref 40–150)
BILIRUBIN TOTAL: 0.58 mg/dL (ref 0.20–1.20)
BUN: 26.6 mg/dL — AB (ref 7.0–26.0)
CALCIUM: 8.9 mg/dL (ref 8.4–10.4)
CO2: 26 mEq/L (ref 22–29)
CREATININE: 1.4 mg/dL — AB (ref 0.7–1.3)
Chloride: 106 mEq/L (ref 98–109)
EGFR: 45 mL/min/{1.73_m2} — ABNORMAL LOW (ref >90)
Glucose: 102 mg/dl (ref 70–140)
Potassium: 4.1 mEq/L (ref 3.5–5.1)
Sodium: 139 mEq/L (ref 136–145)
TOTAL PROTEIN: 6.9 g/dL (ref 6.4–8.3)

## 2016-10-08 MED ORDER — DENOSUMAB 120 MG/1.7ML ~~LOC~~ SOLN
120.0000 mg | Freq: Once | SUBCUTANEOUS | Status: AC
Start: 2016-10-08 — End: 2016-10-08
  Administered 2016-10-08: 120 mg via SUBCUTANEOUS
  Filled 2016-10-08: qty 1.7

## 2016-10-08 MED ORDER — LEUPROLIDE ACETATE (4 MONTH) 30 MG IM KIT
22.5000 mg | PACK | Freq: Once | INTRAMUSCULAR | Status: AC
  Administered 2016-10-08: 22.5 mg via INTRAMUSCULAR
  Filled 2016-10-08: qty 30

## 2016-10-08 MED ORDER — DENOSUMAB 120 MG/1.7ML ~~LOC~~ SOLN: 120.0000 mg | Freq: Once | SUBCUTANEOUS | Status: DC

## 2016-10-08 NOTE — Progress Notes (Signed)
Clayton [name] 10/08/16   Clayton [name], MD [place] [address]  DIAGNOSIS: 82 year old man with hormone sensitive metastatic prostate cancer diagnosed in June 2015. He presented with bony metastasis and a PSA of 70.23.   CURRENT TREATMENT:   Lupron 22.5 mg q 3 months started on 06/05/2014. This will be given every 3 months. Completed one month of casodex 50 mg daily on 06/29/2014.  Xgeva given in September 2015. This will be resumed on May 2017.  INTERVAL HISTORY: Clayton [name] presents today for a follow-up visit with his wife and son. Since the last visit, he reports no recent complaints. His quality of life remains very limited and confined to a chair. He is blind and extremely hard of hearing. He does not report any increased pain or decline in his performance status her quality of life. His appetite remained reasonable without any major changes. He continues to receive injections every 3 months of Lupron and Xgeva without any recent complications.   He does not report any headaches or blurry vision or syncope. He does not report any fevers, chills, sweats or weight loss. He does not report any chest pain, palpitation orthopnea or dyspnea. He does not report any cough or hemoptysis or wheezing. Does not report any nausea, vomiting, abdominal pain. He does not report any constipation or diarrhea. Does not report any hematuria but does report nocturia and frequency. Rest of his review of systems unremarkable.  MEDICAL HISTORY: Past Medical History:  Diagnosis Date  . Aortic aneurysm (HCC)    at least 3 cmm infrarenal AAA by lumbar CT 05/22/13 [facility]); 41 mm proximal ascending aorta on echo 05/05/13 [facility])  . Arthritis   . CAD (coronary artery disease)    s/p CABG 1999  . GERD (gastroesophageal reflux disease)   . H/O hiatal hernia   . HLD (hyperlipidemia)   . HTN (hypertension)   . Peripheral vascular disease  ([place])    aneursym     ALLERGIES:  is allergic to tetanus toxoids.  MEDICATIONS:  Current Outpatient Prescriptions  Medication Sig Dispense Refill  . aspirin EC 81 MG tablet Take 81 mg by mouth daily.    . cyanocobalamin (,VITAMIN B-12,) 1000 MCG/ML injection Inject 1,000 mcg into the muscle See admin instructions. 1000 mcg IM daily for 5 days (received 7/7-7/11), then weekly for 3 weeks (first dose due 7/18), then monthly    . denosumab (XGEVA) 120 MG/1.7ML SOLN injection Inject 120 mg into the skin once.    . donepezil (ARICEPT) 5 MG tablet Take 5 mg by mouth at bedtime.     Marland Kitchen latanoprost (XALATAN) 0.005 % ophthalmic solution Place 1 drop into both eyes at bedtime.     Marland Kitchen leuprolide (LUPRON) 22.5 MG injection Inject 22.5 mg into the muscle every 3 (three) months.    Marland Kitchen lisinopril (PRINIVIL,ZESTRIL) 10 MG tablet Take 1 tablet by mouth daily.    . metoprolol (LOPRESSOR) 100 MG tablet Take 100 mg by mouth daily.     . metoprolol (LOPRESSOR) 50 MG tablet Take 50 mg by mouth at bedtime.    . multivitamin-lutein (OCUVITE-LUTEIN) CAPS Take 1 capsule by mouth 2 (two) times daily.      Marland Kitchen omeprazole (PRILOSEC) 20 MG capsule Take 20 mg by mouth daily.     . Tamsulosin HCl (FLOMAX) 0.4 MG CAPS Take 0.4 mg by mouth at bedtime.     Marland Kitchen warfarin (COUMADIN) 1 MG tablet Take 7 mg  by mouth daily. Pt takes  5 mg and 2 1 mg tablets to equal a total of 7 mg on Sunday Thursday Saturday    . warfarin (COUMADIN) 4 MG tablet Take 4 mg by mouth daily.    Marland Kitchen amoxicillin (AMOXIL) 500 MG tablet Take 4 tablets by mouth one hour prior to dental appointment (Patient not taking: Reported on 10/08/2016) 8 tablet 2   No current facility-administered medications for this visit.     ECOG PERFORMANCE STATUS:  3  Blood pressure (!) 126/58, pulse (!) 59, temperature 97.7 F (36.5 C), temperature source Oral, resp. rate 18, height 5\' 8"  (1.727 m), SpO2 95 %.  GENERAL: Elderly, Frail gentleman appeared without distress. SKIN:  No oral ulcers or lesions. EYES: Sclerae are anicteric. OROPHARYNX:. No oral thrush noted. NECK: supple, thyroid normal size.  LYMPH: no palpable lymphadenopathy in the cervical, axillary or inguinal  LUNGS: clear to auscultation and percussion with normal breathing effort  HEART: regular rate & rhythm and no murmurs and no lower extremity edema  ABDOMEN:abdomen soft, non-tender and normal bowel sounds. No shifting dullness or ascites. Musculoskeletal:no cyanosis of digits and no clubbing  NEURO: No focal deficits noted.  Labs:  Lab Results  Component Value Date   WBC 11.1 (H) 10/08/2016   HGB 11.6 (L) 10/08/2016   HCT 36.0 (L) 10/08/2016   MCV 92.8 10/08/2016   PLT 192 10/08/2016   NEUTROABS 8.0 (H) 10/08/2016      Chemistry      Component Value Date/Time   NA 139 10/08/2016 1448   K 4.1 10/08/2016 1448   CL 103 02/22/2015 0927   CO2 26 10/08/2016 1448   BUN 26.6 (H) 10/08/2016 1448   CREATININE 1.4 (H) 10/08/2016 1448      Component Value Date/Time   CALCIUM 8.9 10/08/2016 1448   ALKPHOS 156 (H) 10/08/2016 1448   AST 20 10/08/2016 1448   ALT 17 10/08/2016 1448   BILITOT 0.58 10/08/2016 1448      Results for CRUIZE, SHAULIS (MRN [id number]) as of 10/08/2016 15:07  Ref. Range 11/23/2014 08:27 02/22/2015 08:18 06/07/2015 08:32 09/20/2015 15:15 12/19/2015 14:48 12/19/2015 14:48 03/19/2016 12:33 06/25/2016 13:57  PSA Latest Ref Range: 0.0 - 4.0 ng/mL 3.21 1.68 1.14 1.17 2.62 2.7 6.7 (H) 14.0 (H)     ASSESSMENT AND PLAN : Clayton [name] 82 y.o. male with:    1. Metastatic Prostate cancer. His CT scan in February 2016 showed metastatic disease to the bone but no visceral metastasis. 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  He is currently on Lupron and have tolerated it well. His PSA initially declined to 1.14 but most recently up to 14. He is clinical status have not changed dramatically.  The natural course of this disease was  reviewed today with the patient and his family. 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 He is not a systemic chemotherapy candidate and would be reasonable to try Xtandi or Zytiga as an option. The logistics of using these medications were reviewed today which include more frequent laboratory monitoring and potential side effects.  After discussion today, we have deferred this option at this time given the fact that he is asymptomatic. The family is interested in continuing his oncology care potentially close to home at the [facility]. I certainly do not have any objections at this time and this might be a reasonable option given  the anticipated continuous decline in his health. They understand that likely his cancer will become symptomatic and require more intense treatments as well as potentially hospice.  I have given him information about oncology providers closer to their residence and I'm happy to make the appropriate referrals once they decided about her future care. I will make his follow-up in 3 months as scheduled and they will feel free to cancel it if they decide to pursue oncology care closer to home.   2. Pathologic R. Hip fracture(avulsion, less trochanteric) s/p R ORIF (05/10/2014). Not active at this time without any new fractures or dislocations.  3. Bone directed therapy: He is okay to resume Xgeva at this time. I see no objections at this time. Complications such as hypokalemia and osteonecrosis of the jaw were reviewed again and he is agreeable to continue.  4. AVR s/p mechanical valve replacement: Anticoagulation resumed with Coumadin. No bleeding complications noted.  5. Diastolic CHF/HTN/CAD: follows up with his primary care physician and cardiologist regarding this issue.  6. Anemia: His hemoglobin remains stable at this time. 7 anemia likely multifactorial in nature.  7. Follow-up:  Will be in 3 months for Lupron and Xgeva injections.   [facility], MD 10/08/16

## 2016-10-08 NOTE — Patient Instructions (Signed)
Denosumab injection What is this medicine? DENOSUMAB (den oh sue mab) slows bone breakdown. Prolia is used to treat osteoporosis in women after menopause and in men. Xgeva is used to prevent bone fractures and other bone problems caused by cancer bone metastases. Xgeva is also used to treat giant cell tumor of the bone. This medicine may be used for other purposes; ask your health care provider or pharmacist if you have questions. COMMON BRAND NAME(S): Prolia, XGEVA What should I tell my health care provider before I take this medicine? They need to know if you have any of these conditions: -dental disease -eczema -infection or history of infections -kidney disease or on dialysis -low blood calcium or vitamin D -malabsorption syndrome -scheduled to have surgery or tooth extraction -taking medicine that contains denosumab -thyroid or parathyroid disease -an unusual reaction to denosumab, other medicines, foods, dyes, or preservatives -pregnant or trying to get pregnant -breast-feeding How should I use this medicine? This medicine is for injection under the skin. It is given by a health care professional in a hospital or clinic setting. If you are getting Prolia, a special MedGuide will be given to you by the pharmacist with each prescription and refill. Be sure to read this information carefully each time. For Prolia, talk to your pediatrician regarding the use of this medicine in children. Special care may be needed. For Xgeva, talk to your pediatrician regarding the use of this medicine in children. While this drug may be prescribed for children as young as 13 years for selected conditions, precautions do apply. Overdosage: If you think you've taken too much of this medicine contact a poison control center or emergency room at once. Overdosage: If you think you have taken too much of this medicine contact a poison control center or emergency room at once. NOTE: This medicine is only for  you. Do not share this medicine with others. What if I miss a dose? It is important not to miss your dose. Call your doctor or health care professional if you are unable to keep an appointment. What may interact with this medicine? Do not take this medicine with any of the following medications: -other medicines containing denosumab This medicine may also interact with the following medications: -medicines that suppress the immune system -medicines that treat cancer -steroid medicines like prednisone or cortisone This list may not describe all possible interactions. Give your health care provider a list of all the medicines, herbs, non-prescription drugs, or dietary supplements you use. Also tell them if you smoke, drink alcohol, or use illegal drugs. Some items may interact with your medicine. What should I watch for while using this medicine? Visit your doctor or health care professional for regular checks on your progress. Your doctor or health care professional may order blood tests and other tests to see how you are doing. Call your doctor or health care professional if you get a cold or other infection while receiving this medicine. Do not treat yourself. This medicine may decrease your body's ability to fight infection. You should make sure you get enough calcium and vitamin D while you are taking this medicine, unless your doctor tells you not to. Discuss the foods you eat and the vitamins you take with your health care professional. See your dentist regularly. Brush and floss your teeth as directed. Before you have any dental work done, tell your dentist you are receiving this medicine. Do not become pregnant while taking this medicine or for 5 months after stopping   it. Women should inform their doctor if they wish to become pregnant or think they might be pregnant. There is a potential for serious side effects to an unborn child. Talk to your health care professional or pharmacist for more  information. What side effects may I notice from receiving this medicine? Side effects that you should report to your doctor or health care professional as soon as possible: -allergic reactions like skin rash, itching or hives, swelling of the face, lips, or tongue -breathing problems -chest pain -fast, irregular heartbeat -feeling faint or lightheaded, falls -fever, chills, or any other sign of infection -muscle spasms, tightening, or twitches -numbness or tingling -skin blisters or bumps, or is dry, peels, or red -slow healing or unexplained pain in the mouth or jaw -unusual bleeding or bruising Side effects that usually do not require medical attention (Report these to your doctor or health care professional if they continue or are bothersome.): -muscle pain -stomach upset, gas This list may not describe all possible side effects. Call your doctor for medical advice about side effects. You may report side effects to FDA at 1-800-FDA-1088. Where should I keep my medicine? This medicine is only given in a clinic, doctor's office, or other health care setting and will not be stored at home. NOTE: This sheet is a summary. It may not cover all possible information. If you have questions about this medicine, talk to your doctor, pharmacist, or health care provider.  2015, [employer]. (2012-04-25 12:37:47) Leuprolide depot injection or implant What is this medicine? LEUPROLIDE (loo PROE lide) is a man-made protein that acts like a natural hormone in the body. It decreases testosterone in men and decreases estrogen in women. In men, this medicine is used to treat advanced prostate cancer. In women, some forms of this medicine may be used to treat endometriosis, uterine fibroids, or other male hormone-related problems. This medicine may be used for other purposes; ask your health care provider or pharmacist if you have questions. COMMON BRAND NAME(S): Eligard, Lupron Depot, Lupron  Depot-Ped, Viadur What should I tell my health care provider before I take this medicine? They need to know if you have any of these conditions: -diabetes -heart disease or previous heart attack -high blood pressure -high cholesterol -osteoporosis -pain or difficulty passing urine -spinal cord metastasis -stroke -tobacco smoker -unusual vaginal bleeding (women) -an unusual or allergic reaction to leuprolide, benzyl alcohol, other medicines, foods, dyes, or preservatives -pregnant or trying to get pregnant -breast-feeding How should I use this medicine? This medicine is for injection into a muscle or for implant or injection under the skin. It is given by a health care professional in a hospital or clinic setting. The specific product will determine how it will be given to you. Make sure you understand which product you receive and how often you will receive it. Talk to your pediatrician regarding the use of this medicine in children. Special care may be needed. Overdosage: If you think you have taken too much of this medicine contact a poison control center or emergency room at once. NOTE: This medicine is only for you. Do not share this medicine with others. What if I miss a dose? It is important not to miss a dose. Call your doctor or health care professional if you are unable to keep an appointment. Depot injections: Depot injections are given either once-monthly, every 12 weeks, every 16 weeks, or every 24 weeks depending on the product you are prescribed. The product you are prescribed  will be based on if you are male or male, and your condition. Make sure you understand your product and dosing. Implant dosing: The implant is removed and replaced once a year. The implant is only used in males. What may interact with this medicine? Do not take this medicine with any of the following medications: -chasteberry This medicine may also interact with the following medications: -herbal or  dietary supplements, like black cohosh or DHEA -male hormones, like estrogens or progestins and birth control pills, patches, rings, or injections -male hormones, like testosterone This list may not describe all possible interactions. Give your health care provider a list of all the medicines, herbs, non-prescription drugs, or dietary supplements you use. Also tell them if you smoke, drink alcohol, or use illegal drugs. Some items may interact with your medicine. What should I watch for while using this medicine? Visit your doctor or health care professional for regular checks on your progress. During the first weeks of treatment, your symptoms may get worse, but then will improve as you continue your treatment. You may get hot flashes, increased bone pain, increased difficulty passing urine, or an aggravation of nerve symptoms. Discuss these effects with your doctor or health care professional, some of them may improve with continued use of this medicine. Male patients may experience a menstrual cycle or spotting during the first months of therapy with this medicine. If this continues, contact your doctor or health care professional. What side effects may I notice from receiving this medicine? Side effects that you should report to your doctor or health care professional as soon as possible: -allergic reactions like skin rash, itching or hives, swelling of the face, lips, or tongue -breathing problems -chest pain -depression or memory disorders -pain in your legs or groin -pain at site where injected or implanted -severe headache -swelling of the feet and legs -visual changes -vomiting Side effects that usually do not require medical attention (report to your doctor or health care professional if they continue or are bothersome): -breast swelling or tenderness -decrease in sex drive or performance -diarrhea -hot flashes -loss of appetite -muscle, joint, or bone pains -nausea -redness  or irritation at site where injected or implanted -skin problems or acne This list may not describe all possible side effects. Call your doctor for medical advice about side effects. You may report side effects to FDA at 1-800-FDA-1088. Where should I keep my medicine? This drug is given in a hospital or clinic and will not be stored at home. NOTE: This sheet is a summary. It may not cover all possible information. If you have questions about this medicine, talk to your doctor, pharmacist, or health care provider.  2015, [employer]. (2010-04-29 14:41:21) Leuprolide depot injection What is this medicine? LEUPROLIDE (loo PROE lide) is a man-made protein that acts like a natural hormone in the body. It decreases testosterone in men and decreases estrogen in women. In men, this medicine is used to treat advanced prostate cancer. In women, some forms of this medicine may be used to treat endometriosis, uterine fibroids, or other male hormone-related problems. This medicine may be used for other purposes; ask your health care provider or pharmacist if you have questions. What should I tell my health care provider before I take this medicine? They need to know if you have any of these conditions: -diabetes -heart disease or previous heart attack -high blood pressure -high cholesterol -osteoporosis -pain or difficulty passing urine -spinal cord metastasis -stroke -tobacco smoker -unusual vaginal  bleeding (women) -an unusual or allergic reaction to leuprolide, benzyl alcohol, other medicines, foods, dyes, or preservatives -pregnant or trying to get pregnant -breast-feeding How should I use this medicine? This medicine is for injection into a muscle or for injection under the skin. It is given by a health care professional in a hospital or clinic setting. The specific product will determine how it will be given to you. Make sure you understand which product you receive and how often  you will receive it. Talk to your pediatrician regarding the use of this medicine in children. Special care may be needed. Overdosage: If you think you have taken too much of this medicine contact a poison control center or emergency room at once. NOTE: This medicine is only for you. Do not share this medicine with others. What if I miss a dose? It is important not to miss a dose. Call your doctor or health care professional if you are unable to keep an appointment. Depot injections: Depot injections are given either once-monthly, every 12 weeks, every 16 weeks, or every 24 weeks depending on the product you are prescribed. The product you are prescribed will be based on if you are male or male, and your condition. Make sure you understand your product and dosing. What may interact with this medicine? Do not take this medicine with any of the following medications: -chasteberry This medicine may also interact with the following medications: -herbal or dietary supplements, like black cohosh or DHEA -male hormones, like estrogens or progestins and birth control pills, patches, rings, or injections -male hormones, like testosterone This list may not describe all possible interactions. Give your health care provider a list of all the medicines, herbs, non-prescription drugs, or dietary supplements you use. Also tell them if you smoke, drink alcohol, or use illegal drugs. Some items may interact with your medicine. What should I watch for while using this medicine? Visit your doctor or health care professional for regular checks on your progress. During the first weeks of treatment, your symptoms may get worse, but then will improve as you continue your treatment. You may get hot flashes, increased bone pain, increased difficulty passing urine, or an aggravation of nerve symptoms. Discuss these effects with your doctor or health care professional, some of them may improve with continued use of this  medicine. Male patients may experience a menstrual cycle or spotting during the first months of therapy with this medicine. If this continues, contact your doctor or health care professional. What side effects may I notice from receiving this medicine? Side effects that you should report to your doctor or health care professional as soon as possible: -allergic reactions like skin rash, itching or hives, swelling of the face, lips, or tongue -breathing problems -chest pain -depression or memory disorders -pain in your legs or groin -pain at site where injected or implanted -severe headache -swelling of the feet and legs -visual changes -vomiting Side effects that usually do not require medical attention (report to your doctor or health care professional if they continue or are bothersome): -breast swelling or tenderness -decrease in sex drive or performance -diarrhea -hot flashes -loss of appetite -muscle, joint, or bone pains -nausea -redness or irritation at site where injected or implanted -skin problems or acne This list may not describe all possible side effects. Call your doctor for medical advice about side effects. You may report side effects to FDA at 1-800-FDA-1088. Where should I keep my medicine? This drug is given in a  hospital or clinic and will not be stored at home. NOTE: This sheet is a summary. It may not cover all possible information. If you have questions about this medicine, talk to your doctor, pharmacist, or health care provider.    2016, [employer]. (2014-07-20 14:16:23)

## 2016-10-08 NOTE — Telephone Encounter (Signed)
Gave patient wife avs report and appointments for March.

## 2016-10-09 LAB — PSA: PROSTATE SPECIFIC AG, SERUM: 32.3 ng/mL — AB (ref 0.0–4.0)

## 2016-11-12 ENCOUNTER — Emergency Department (HOSPITAL_COMMUNITY): Payer: Medicare Other

## 2016-11-12 ENCOUNTER — Observation Stay (HOSPITAL_COMMUNITY): Payer: Medicare Other

## 2016-11-12 ENCOUNTER — Encounter (HOSPITAL_COMMUNITY): Payer: Self-pay | Admitting: Nurse Practitioner

## 2016-11-12 ENCOUNTER — Inpatient Hospital Stay (HOSPITAL_COMMUNITY)
Admission: EM | Admit: 2016-11-12 | Discharge: 2016-11-15 | DRG: 640 | Disposition: A | Discharge status: Skilled Nursing Facility | Payer: Medicare Other | Attending: Internal Medicine | Admitting: Internal Medicine

## 2016-11-12 DIAGNOSIS — C7951 Secondary malignant neoplasm of bone: Secondary | ICD-10-CM | POA: Diagnosis not present

## 2016-11-12 DIAGNOSIS — I739 Peripheral vascular disease, unspecified: Secondary | ICD-10-CM | POA: Diagnosis not present

## 2016-11-12 DIAGNOSIS — I2510 Atherosclerotic heart disease of native coronary artery without angina pectoris: Secondary | ICD-10-CM | POA: Diagnosis present

## 2016-11-12 DIAGNOSIS — F0390 Unspecified dementia without behavioral disturbance: Secondary | ICD-10-CM | POA: Diagnosis not present

## 2016-11-12 DIAGNOSIS — Z952 Presence of prosthetic heart valve: Secondary | ICD-10-CM | POA: Diagnosis not present

## 2016-11-12 DIAGNOSIS — Z951 Presence of aortocoronary bypass graft: Secondary | ICD-10-CM

## 2016-11-12 DIAGNOSIS — C61 Malignant neoplasm of prostate: Secondary | ICD-10-CM | POA: Diagnosis present

## 2016-11-12 DIAGNOSIS — E785 Hyperlipidemia, unspecified: Secondary | ICD-10-CM | POA: Diagnosis present

## 2016-11-12 DIAGNOSIS — Z8249 Family history of ischemic heart disease and other diseases of the circulatory system: Secondary | ICD-10-CM

## 2016-11-12 DIAGNOSIS — D649 Anemia, unspecified: Secondary | ICD-10-CM | POA: Diagnosis not present

## 2016-11-12 DIAGNOSIS — I5032 Chronic diastolic (congestive) heart failure: Secondary | ICD-10-CM | POA: Diagnosis present

## 2016-11-12 DIAGNOSIS — I130 Hypertensive heart and chronic kidney disease with heart failure and stage 1 through stage 4 chronic kidney disease, or unspecified chronic kidney disease: Secondary | ICD-10-CM | POA: Diagnosis not present

## 2016-11-12 DIAGNOSIS — M1990 Unspecified osteoarthritis, unspecified site: Secondary | ICD-10-CM | POA: Diagnosis present

## 2016-11-12 DIAGNOSIS — N183 Chronic kidney disease, stage 3 (moderate): Secondary | ICD-10-CM | POA: Diagnosis present

## 2016-11-12 DIAGNOSIS — H547 Unspecified visual loss: Secondary | ICD-10-CM | POA: Diagnosis present

## 2016-11-12 DIAGNOSIS — Z7982 Long term (current) use of aspirin: Secondary | ICD-10-CM

## 2016-11-12 DIAGNOSIS — Z887 Allergy status to serum and vaccine status: Secondary | ICD-10-CM

## 2016-11-12 DIAGNOSIS — K219 Gastro-esophageal reflux disease without esophagitis: Secondary | ICD-10-CM | POA: Diagnosis not present

## 2016-11-12 DIAGNOSIS — Z7901 Long term (current) use of anticoagulants: Secondary | ICD-10-CM

## 2016-11-12 DIAGNOSIS — Z87891 Personal history of nicotine dependence: Secondary | ICD-10-CM

## 2016-11-12 DIAGNOSIS — A09 Infectious gastroenteritis and colitis, unspecified: Secondary | ICD-10-CM | POA: Diagnosis not present

## 2016-11-12 DIAGNOSIS — I10 Essential (primary) hypertension: Secondary | ICD-10-CM | POA: Diagnosis present

## 2016-11-12 DIAGNOSIS — E860 Dehydration: Secondary | ICD-10-CM | POA: Diagnosis not present

## 2016-11-12 DIAGNOSIS — G9340 Encephalopathy, unspecified: Secondary | ICD-10-CM

## 2016-11-12 DIAGNOSIS — R197 Diarrhea, unspecified: Secondary | ICD-10-CM | POA: Diagnosis present

## 2016-11-12 DIAGNOSIS — Z66 Do not resuscitate: Secondary | ICD-10-CM | POA: Diagnosis present

## 2016-11-12 DIAGNOSIS — I714 Abdominal aortic aneurysm, without rupture: Secondary | ICD-10-CM | POA: Diagnosis present

## 2016-11-12 LAB — URINALYSIS, ROUTINE W REFLEX MICROSCOPIC
BILIRUBIN URINE: NEGATIVE
GLUCOSE, UA: NEGATIVE mg/dL
KETONES UR: NEGATIVE mg/dL
LEUKOCYTES UA: NEGATIVE
NITRITE: NEGATIVE
PROTEIN: 30 mg/dL — AB
Specific Gravity, Urine: 1.021 (ref 1.005–1.030)
pH: 5 (ref 5.0–8.0)

## 2016-11-12 LAB — DIFFERENTIAL
Basophils Absolute: 0 10*3/uL (ref 0.0–0.1)
Basophils Relative: 0 %
EOS ABS: 0.2 10*3/uL (ref 0.0–0.7)
EOS PCT: 2 %
LYMPHS PCT: 16 %
Lymphs Abs: 1.5 10*3/uL (ref 0.7–4.0)
MONO ABS: 0.8 10*3/uL (ref 0.1–1.0)
Monocytes Relative: 9 %
NEUTROS PCT: 73 %
Neutro Abs: 6.5 10*3/uL (ref 1.7–7.7)

## 2016-11-12 LAB — PROTIME-INR
INR: 2.58
PROTHROMBIN TIME: 28.2 s — AB (ref 11.4–15.2)

## 2016-11-12 LAB — COMPREHENSIVE METABOLIC PANEL
ALBUMIN: 3.5 g/dL (ref 3.5–5.0)
ALT: 15 U/L — ABNORMAL LOW (ref 17–63)
AST: 24 U/L (ref 15–41)
Alkaline Phosphatase: 172 U/L — ABNORMAL HIGH (ref 38–126)
Anion gap: 8 (ref 5–15)
BUN: 22 mg/dL — AB (ref 6–20)
CO2: 26 mmol/L (ref 22–32)
Calcium: 8.2 mg/dL — ABNORMAL LOW (ref 8.9–10.3)
Chloride: 107 mmol/L (ref 101–111)
Creatinine, Ser: 1.37 mg/dL — ABNORMAL HIGH (ref 0.61–1.24)
GFR calc Af Amer: 51 mL/min — ABNORMAL LOW (ref >60)
GFR, EST NON AFRICAN AMERICAN: 44 mL/min — AB (ref >60)
Glucose, Bld: 124 mg/dL — ABNORMAL HIGH (ref 65–99)
POTASSIUM: 3.9 mmol/L (ref 3.5–5.1)
Sodium: 141 mmol/L (ref 135–145)
TOTAL PROTEIN: 6.9 g/dL (ref 6.5–8.1)
Total Bilirubin: 0.4 mg/dL (ref 0.3–1.2)

## 2016-11-12 LAB — CBC
HEMATOCRIT: 39.1 % (ref 39.0–52.0)
HEMOGLOBIN: 12.5 g/dL — AB (ref 13.0–17.0)
MCH: 29.8 pg (ref 26.0–34.0)
MCHC: 32 g/dL (ref 30.0–36.0)
MCV: 93.1 fL (ref 78.0–100.0)
Platelets: 206 10*3/uL (ref 150–400)
RBC: 4.2 MIL/uL — ABNORMAL LOW (ref 4.22–5.81)
RDW: 13.8 % (ref 11.5–15.5)
WBC: 8.6 10*3/uL (ref 4.0–10.5)

## 2016-11-12 LAB — LIPASE, BLOOD: Lipase: 17 U/L (ref 11–51)

## 2016-11-12 LAB — CBG MONITORING, ED: Glucose-Capillary: 109 mg/dL — ABNORMAL HIGH (ref 65–99)

## 2016-11-12 MED ORDER — ONDANSETRON HCL 4 MG/2ML IJ SOLN
4.0000 mg | Freq: Four times a day (QID) | INTRAMUSCULAR | Status: DC | PRN
  Administered 2016-11-13: 4 mg via INTRAVENOUS
  Filled 2016-11-12: qty 2

## 2016-11-12 MED ORDER — METOPROLOL TARTRATE 5 MG/5ML IV SOLN
5.0000 mg | Freq: Four times a day (QID) | INTRAVENOUS | Status: DC
  Administered 2016-11-12 – 2016-11-13 (×3): 5 mg via INTRAVENOUS
  Filled 2016-11-12 (×3): qty 5

## 2016-11-12 MED ORDER — ACETAMINOPHEN 650 MG RE SUPP: 650.0000 mg | Freq: Four times a day (QID) | RECTAL | Status: DC | PRN

## 2016-11-12 MED ORDER — HYDROCODONE-ACETAMINOPHEN 5-325 MG PO TABS: 1.0000 | ORAL_TABLET | Freq: Once | ORAL | Status: DC

## 2016-11-12 MED ORDER — HYDRALAZINE HCL 20 MG/ML IJ SOLN
10.0000 mg | INTRAMUSCULAR | Status: DC | PRN
  Administered 2016-11-13 (×3): 10 mg via INTRAVENOUS
  Filled 2016-11-12 (×3): qty 1

## 2016-11-12 MED ORDER — SODIUM CHLORIDE 0.9 % IV BOLUS (SEPSIS)
1000.0000 mL | Freq: Once | INTRAVENOUS | Status: AC
  Administered 2016-11-12: 1000 mL via INTRAVENOUS

## 2016-11-12 MED ORDER — ONDANSETRON HCL 4 MG PO TABS: 4.0000 mg | ORAL_TABLET | Freq: Four times a day (QID) | ORAL | Status: DC | PRN

## 2016-11-12 MED ORDER — LATANOPROST 0.005 % OP SOLN
1.0000 [drp] | Freq: Every day | OPHTHALMIC | Status: DC
  Administered 2016-11-12 – 2016-11-14 (×3): 1 [drp] via OPHTHALMIC
  Filled 2016-11-12: qty 2.5

## 2016-11-12 MED ORDER — ACETAMINOPHEN 325 MG PO TABS: 650.0000 mg | ORAL_TABLET | Freq: Four times a day (QID) | ORAL | Status: DC | PRN

## 2016-11-12 NOTE — H&P (Signed)
History and Physical    Clayton CUMBO [id number] DOB: 1926/04/03 DOA: 11/12/2016  PCP: [name], MD  Patient coming from: Home.  Chief Complaint: Lethargic.  HPI: Clayton [name] is a 83 y.o. male with history of metastatic prostate cancer on hormone therapy, CAD status post CABG and mechanical aortic valve replacement on Coumadin, hypertension, chronic kidney disease stage III, anemia was brought to the ER after patient was found to be increasingly confused over the last 3 days. Patient also had 3-4 episodes of diarrhea today. Patient did not have any chest pain shortness of breath nausea vomiting diarrhea. Patient is not on any pain relief medications or sedatives. CT head was unremarkable. On my exam patient is lethargic but oriented to his name and is able to recognize his daughter's voice. Patient is blind in both eyes. Patient received 1 L normal saline bolus in the ER. Patient will be admitted for acute encephalopathy cause not clear likely could be from dehydration.   ED Course: CT head was unremarkable. UA and chest x-Zyeir were unremarkable. 1 L normal saline bolus was given.  Review of Systems: As per HPI, rest all negative.   Past Medical History:  Diagnosis Date  . Aortic aneurysm (HCC)    at least 3 cmm infrarenal AAA by lumbar CT 05/22/13 [facility]); 41 mm proximal ascending aorta on echo 05/05/13 [facility])  . Arthritis   . CAD (coronary artery disease)    s/p CABG 1999  . GERD (gastroesophageal reflux disease)   . H/O hiatal hernia   . HLD (hyperlipidemia)   . HTN (hypertension)   . Peripheral vascular disease ([place])    aneursym    Past Surgical History:  Procedure Laterality Date  . AORTIC VALVE REPLACEMENT  1998  . CHOLECYSTECTOMY    . CORONARY ARTERY BYPASS GRAFT  1998  . HAND SURGERY Left    MULTIPLE   . INGUINAL HERNIA REPAIR  09/25/2013   with mesh  . INGUINAL HERNIA REPAIR Right 09/25/2013   Procedure: HERNIA REPAIR INGUINAL ADULT;  Surgeon:  Imogene Burn. Georgette Dover, MD;  Location: [place];  Service: General;  Laterality: Right;  . INSERTION OF MESH Right 09/25/2013   Procedure: INSERTION OF MESH;  Surgeon: Imogene Burn. Georgette Dover, MD;  Location: [place];  Service: General;  Laterality: Right;  . INTRAMEDULLARY (IM) NAIL INTERTROCHANTERIC Right 05/10/2014   Procedure: Open Reduction Internal Fixation Right Hip;  Surgeon: Mcarthur Rossetti, MD;  Location: [place];  Service: Orthopedics;  Laterality: Right;  . TONSILLECTOMY    . US EXTREMITY*L* Left    arm     reports that he has quit smoking. He has never used smokeless tobacco. He reports that he does not drink alcohol or use drugs.  Allergies  Allergen Reactions  . Tetanus Toxoids Hives    Family History  Problem Relation Age of Onset  . Coronary artery disease    . Heart attack Brother     Prior to Admission medications   Medication Sig Start Date End Date Taking? Authorizing Provider  aspirin EC 81 MG tablet Take 81 mg by mouth daily.   Yes Historical Provider, MD  cyanocobalamin (,VITAMIN B-12,) 1000 MCG/ML injection Inject 1,000 mcg into the muscle See admin instructions. 1000 mcg IM daily for 5 days (received 7/7-7/11), then weekly for 3 weeks (first dose due 7/18), then monthly   Yes Historical Provider, MD  donepezil (ARICEPT) 5 MG tablet Take 5 mg by mouth at bedtime.  02/26/14  Yes  Historical Provider, MD  latanoprost (XALATAN) 0.005 % ophthalmic solution Place 1 drop into both eyes at bedtime.  07/26/13  Yes Historical Provider, MD  leuprolide (LUPRON) 22.5 MG injection Inject 22.5 mg into the muscle every 3 (three) months.   Yes Historical Provider, MD  lisinopril (PRINIVIL,ZESTRIL) 10 MG tablet Take 1 tablet by mouth daily. 02/26/14  Yes Historical Provider, MD  metoprolol (LOPRESSOR) 100 MG tablet Take 100 mg by mouth daily.    Yes Historical Provider, MD  metoprolol (LOPRESSOR) 50 MG tablet Take 50 mg by mouth at bedtime.   Yes Historical Provider, MD  multivitamin-lutein  (OCUVITE-LUTEIN) CAPS Take 1 capsule by mouth 2 (two) times daily.     Yes Historical Provider, MD  omeprazole (PRILOSEC) 20 MG capsule Take 20 mg by mouth daily.  03/27/14  Yes Historical Provider, MD  Tamsulosin HCl (FLOMAX) 0.4 MG CAPS Take 0.4 mg by mouth at bedtime.    Yes Historical Provider, MD  warfarin (COUMADIN) 1 MG tablet Take 5-7 mg by mouth daily. Pt takes  5 mg every day except on Sunday Thursday and Saturday take  two of the 1 mg tablets to equal a total of 7 mg   Yes Historical Provider, MD  amoxicillin (AMOXIL) 500 MG tablet Take 4 tablets by mouth one hour prior to dental appointment Patient not taking: Reported on 11/12/2016 04/18/15   [name], MD    Physical Exam: Vitals:   11/12/16 2100 11/12/16 2115 11/12/16 2130 11/12/16 2236  BP: 144/97 178/92 197/77   Pulse: 68 72 68   Resp: 17 21 18    Temp:      TempSrc:      SpO2: 94% 100% 96%   Weight:    82.9 kg (182 lb 12.8 oz)  Height:    6' (1.829 m)      Constitutional: Moderately built and nourished. Vitals:   11/12/16 2100 11/12/16 2115 11/12/16 2130 11/12/16 2236  BP: 144/97 178/92 197/77   Pulse: 68 72 68   Resp: 17 21 18    Temp:      TempSrc:      SpO2: 94% 100% 96%   Weight:    82.9 kg (182 lb 12.8 oz)  Height:    6' (1.829 m)   Eyes: Anicteric no pallor. ENMT: No discharge from the ears eyes nose and mouth. Neck: No mass felt. No neck rigidity. Respiratory: No rhonchi or crepitations. Cardiovascular: S1-S2 heard no murmurs appreciated. Abdomen: Soft nontender bowel sounds present. No guarding or rigidity. Musculoskeletal: No edema. Skin: No rash. Neurologic: Patient is mildly lethargic but oriented to his name and recognizes his daughter's voice. Moves all extremities. Psychiatric: Lethargic.   Labs on Admission: I have personally reviewed following labs and imaging studies  CBC:  Recent Labs Lab 11/12/16 1658 11/12/16 1957  WBC 8.6  --   NEUTROABS  --  6.5  HGB 12.5*  --   HCT 39.1   --   MCV 93.1  --   PLT 206  --    Basic Metabolic Panel:  Recent Labs Lab 11/12/16 1658  NA 141  K 3.9  CL 107  CO2 26  GLUCOSE 124*  BUN 22*  CREATININE 1.37*  CALCIUM 8.2*   GFR: Estimated Creatinine Clearance: 40.9 mL/min (by C-G formula based on SCr of 1.37 mg/dL (H)). Liver Function Tests:  Recent Labs Lab 11/12/16 1658  AST 24  ALT 15*  ALKPHOS 172*  BILITOT 0.4  PROT 6.9  ALBUMIN 3.5  Recent Labs Lab 11/12/16 1957  LIPASE 17   No results for input(s): AMMONIA in the last 168 hours. Coagulation Profile:  Recent Labs Lab 11/12/16 1957  INR 2.58   Cardiac Enzymes: No results for input(s): CKTOTAL, CKMB, CKMBINDEX, TROPONINI in the last 168 hours. BNP (last 3 results) No results for input(s): PROBNP in the last 8760 hours. HbA1C: No results for input(s): HGBA1C in the last 72 hours. CBG:  Recent Labs Lab 11/12/16 2013  GLUCAP 109*   Lipid Profile: No results for input(s): CHOL, HDL, LDLCALC, TRIG, CHOLHDL, LDLDIRECT in the last 72 hours. Thyroid Function Tests: No results for input(s): TSH, T4TOTAL, FREET4, T3FREE, THYROIDAB in the last 72 hours. Anemia Panel: No results for input(s): VITAMINB12, FOLATE, FERRITIN, TIBC, IRON, RETICCTPCT in the last 72 hours. Urine analysis:    Component Value Date/Time   COLORURINE YELLOW 11/12/2016 1950   APPEARANCEUR HAZY (A) 11/12/2016 1950   LABSPEC 1.021 11/12/2016 1950   PHURINE 5.0 11/12/2016 1950   GLUCOSEU NEGATIVE 11/12/2016 1950   HGBUR SMALL (A) 11/12/2016 1950   BILIRUBINUR NEGATIVE 11/12/2016 1950   KETONESUR NEGATIVE 11/12/2016 1950   PROTEINUR 30 (A) 11/12/2016 1950   UROBILINOGEN 0.2 12/27/2014 1810   NITRITE NEGATIVE 11/12/2016 1950   LEUKOCYTESUR NEGATIVE 11/12/2016 1950   Sepsis Labs: @LABRCNTIP (procalcitonin:4,lacticidven:4) )No results found for this or any previous visit (from the past 240 hour(s)).   Radiological Exams on Admission: Ct Head Wo Contrast  Result Date:  11/12/2016 CLINICAL DATA:  Confusion, altered mental status and weakness. EXAM: CT HEAD WITHOUT CONTRAST TECHNIQUE: Contiguous axial images were obtained from the base of the skull through the vertex without intravenous contrast. COMPARISON:  CT from 05/22/2014 FINDINGS: BRAIN: There is sulcal and ventricular prominence consistent with superficial and central atrophy. No intraparenchymal hemorrhage, mass effect nor midline shift. Periventricular and subcortical white matter hypodensities consistent with chronic moderate small vessel ischemic disease are identified. Small lacunar infarcts noted of the left basal ganglia and anterior limb of internal capsule, chronic in appearance. No acute large vascular territory infarcts. No abnormal extra-axial fluid collections. Basal cisterns are not effaced and midline. VASCULAR: Moderate calcific atherosclerosis of the carotid siphons. SKULL: No skull fracture. No significant scalp soft tissue swelling. SINUSES/ORBITS: The mastoid air-cells are clear. The included paranasal sinuses are well-aerated.The included ocular globes and orbital contents are non-suspicious. OTHER: None. IMPRESSION: Stable atrophy with moderate chronic microvascular ischemic disease. No acute intracranial process. Electronically Signed   By: Ashley Royalty M.D.   On: 11/12/2016 19:53   Dg Chest Portable 1 View  Result Date: 11/12/2016 CLINICAL DATA:  Confusion, altered mental status. EXAM: PORTABLE CHEST 1 VIEW COMPARISON:  08/23/2015 FINDINGS: Postsurgical changes from CABG and valvular replacement, stable. Cardiomediastinal silhouette is normal. Mediastinal contours appear intact. Tortuosity and mild calcific atherosclerotic disease of the aorta. There is no evidence of focal airspace consolidation, pleural effusion or pneumothorax. Osseous structures are without acute abnormality. Soft tissues are grossly normal. IMPRESSION: No active disease. Calcific atherosclerotic disease of the aorta.  Electronically Signed   By: Fidela Salisbury M.D.   On: 11/12/2016 19:29     Assessment/Plan Principal Problem:   Acute encephalopathy Active Problems:   Essential hypertension   Dementia without behavioral disturbance   Chronic diastolic heart failure (HCC)   Prostate cancer metastatic to bone (HCC)   Dehydration   Diarrhea of presumed infectious origin    1. Acute encephalopathy - cause not clear. Since patient had diarrhea dehydration is one of the differentials. Patient  did receive 1 L normal saline bolus in the ER. I have ordered MRI of the brain ammonia levels, urine drug screen and will also check ABG to rule out any carbon dioxide retention. Since patient is lethargic I'm holding off all oral medications until we get swallow evaluation. 2. Diarrhea - has not used any recent antibiotics. Check stool studies. 3. Mechanical aortic valve - will be on heparin until patient can swallow Coumadin. 4. Hypertension - I have placed patient on IV hydralazine and schedule metoprolol until patient can take orally. 5. CAD status post CABG - has not complained of any chest pain. 6. Metastatic prostate cancer - on hormone therapy per oncologist. 7. Chronic anemia - follow CBC. 8. Chronic kidney disease stage III - creatinine appears to be at baseline.  DVT prophylaxis: Heparin/Coumadin. Code Status: DO NOT RESUSCITATE.  Family Communication: Patient's daughter.  Disposition Plan: To be determined.  Consults called: Physical therapy.  Admission status: Observation.    Rise Patience MD [facility] Pager [phone].  If 7PM-7AM, please contact night-coverage [email or web address] Password [email or web address]  11/12/2016, 10:46 PM

## 2016-11-12 NOTE — Progress Notes (Addendum)
ANTICOAGULATION CONSULT NOTE - Initial Consult  Pharmacy Consult for Heparin when INR is <2.5 (while warfarin on hold) Indication: Mechanical aortic valve  Allergies  Allergen Reactions  . Tetanus Toxoids Hives   Patient Measurements: Height: 6' (182.9 cm) Weight: 182 lb 12.8 oz (82.9 kg) IBW/kg (Calculated) : 77.6  Vital Signs: Temp: 97.9 F (36.6 C) (01/04 2251) Temp Source: Oral (01/04 2251) BP: 191/86 (01/04 2258) Pulse Rate: 68 (01/04 2258)  Labs:  Recent Labs  11/12/16 1658 11/12/16 1957  HGB 12.5*  --   HCT 39.1  --   PLT 206  --   LABPROT  --  28.2*  INR  --  2.58  CREATININE 1.37*  --     Estimated Creatinine Clearance: 40.9 mL/min (by C-G formula based on SCr of 1.37 mg/dL (H)).   Medical History: Past Medical History:  Diagnosis Date  . Aortic aneurysm (HCC)    at least 3 cmm infrarenal AAA by lumbar CT 05/22/13 [facility]); 41 mm proximal ascending aorta on echo 05/05/13 [facility])  . Arthritis   . CAD (coronary artery disease)    s/p CABG 1999  . GERD (gastroesophageal reflux disease)   . H/O hiatal hernia   . HLD (hyperlipidemia)   . HTN (hypertension)   . Peripheral vascular disease ([place])    aneursym    Assessment: 83 y/o M on warfarin PTA for mechanical AVR with outpatient INR goal of 2.5-3.5, here with diarrhea, for heparin when INR is <2.5, currently 2.58. Hgb 12.5. Mild bump in Scr.   Goal of Therapy:  Heparin level 0.3-0.7 units/ml INR 2.5-3.5 Monitor platelets by anticoagulation protocol: Yes   Plan:  -Heparin when INR <2.5  Narda Bonds 11/12/2016,11:21 PM   ======================= Addendum 4:47 AM INR down to 2.5 this AM, will start heparin -Start heparin drip at 1000 units/hr -1300 HL Narda Bonds, PharmD, BCPS Clinical Pharmacist Phone: [phone] ========================

## 2016-11-12 NOTE — ED Triage Notes (Addendum)
Pt presents with c/o diarrhea.per his daughter who is his caregiver. The diarrhea began today. He has had about 4 episodes of the diarrhea. He has been incontinent and the diarrhea has an extremely foul smell. He has not had fevers, pain, or vomiting. He has had decreased urinary output today. The patient is blind.

## 2016-11-12 NOTE — ED Provider Notes (Signed)
[facility] DEPT Provider Note   CSN: [id number] Arrival date & time: 11/12/16  1646     History   Chief Complaint Chief Complaint  Patient presents with  . Diarrhea    HPI Clayton [name] is a 83 y.o. male.  HPI 83 year old male who presents with diarrhea. He has a history of abdominal aortic aneurysm, coronary artery disease status post CABG, mechanical aortic valve on Coumadin, hypertension, hyperlipidemia, and peripheral vascular disease. Also history of metastatic prostate cancer to the bone on Lupron therapy. History is provided by patient's daughter who states that he has been more confused and generally weak over the past 2-3 days. Over the past 24 hours he has had profuse diarrhea about 4 episodes that are foul smelling in nature. No known fevers, abdominal pain, nausea or vomiting. He has not urinated today. Denies any chest pain, difficulty breathing. Does complain of mild cough. No melena or hematochezia.   Past Medical History:  Diagnosis Date  . Aortic aneurysm (HCC)    at least 3 cmm infrarenal AAA by lumbar CT 05/22/13 [facility]); 41 mm proximal ascending aorta on echo 05/05/13 [facility])  . Arthritis   . CAD (coronary artery disease)    s/p CABG 1999  . GERD (gastroesophageal reflux disease)   . H/O hiatal hernia   . HLD (hyperlipidemia)   . HTN (hypertension)   . Peripheral vascular disease ([place])    aneursym    Patient Active Problem List   Diagnosis Date Noted  . Dehydration 11/12/2016  . Acute encephalopathy 11/12/2016  . Diarrhea of presumed infectious origin 11/12/2016  . Hot flashes 09/28/2014  . Knee pain, chronic 09/28/2014  . Prostate cancer metastatic to bone ([place]) 05/29/2014  . Anemia 05/21/2014  . Acute on chronic diastolic heart failure ([place]) 05/13/2014  . Chronic diastolic heart failure ([place]) 05/12/2014  . Dementia without behavioral disturbance 05/09/2014  . Acute blood loss anemia 05/09/2014  . Acute on chronic renal failure  ([place]) 05/07/2014  . Closed fracture of lesser trochanter of femur ([place]) 05/06/2014  . Fall at home 05/06/2014  . Femoral fracture ([place]) 05/06/2014  . B12 deficiency 10/19/2013  . Impaired vision 10/19/2013  . Chronic anticoagulation 10/19/2013  . Fall 10/18/2013  . Weakness 10/18/2013  . Preoperative clearance 08/23/2013  . Right inguinal hernia 08/15/2013  . Aortic valve disorders 12/15/2010  . Coronary atherosclerosis of native coronary artery 11/29/2009  . HYPERLIPIDEMIA 07/01/2009  . Essential hypertension 07/01/2009  . s/p [employer] AVR 07/01/2009    Past Surgical History:  Procedure Laterality Date  . AORTIC VALVE REPLACEMENT  1998  . CHOLECYSTECTOMY    . CORONARY ARTERY BYPASS GRAFT  1998  . HAND SURGERY Left    MULTIPLE   . INGUINAL HERNIA REPAIR  09/25/2013   with mesh  . INGUINAL HERNIA REPAIR Right 09/25/2013   Procedure: HERNIA REPAIR INGUINAL ADULT;  Surgeon: Imogene Burn. Georgette Dover, MD;  Location: [place];  Service: General;  Laterality: Right;  . INSERTION OF MESH Right 09/25/2013   Procedure: INSERTION OF MESH;  Surgeon: Imogene Burn. Georgette Dover, MD;  Location: [place];  Service: General;  Laterality: Right;  . INTRAMEDULLARY (IM) NAIL INTERTROCHANTERIC Right 05/10/2014   Procedure: Open Reduction Internal Fixation Right Hip;  Surgeon: Mcarthur Rossetti, MD;  Location: [place];  Service: Orthopedics;  Laterality: Right;  . TONSILLECTOMY    . US EXTREMITY*L* Left    arm       Home Medications    Prior to Admission  medications   Medication Sig Start Date End Date Taking? Authorizing Provider  aspirin EC 81 MG tablet Take 81 mg by mouth daily.   Yes Historical Provider, MD  cyanocobalamin (,VITAMIN B-12,) 1000 MCG/ML injection Inject 1,000 mcg into the muscle See admin instructions. 1000 mcg IM daily for 5 days (received 7/7-7/11), then weekly for 3 weeks (first dose due 7/18), then monthly   Yes Historical Provider, MD  donepezil (ARICEPT) 5 MG tablet Take 5 mg by mouth at  bedtime.  02/26/14  Yes Historical Provider, MD  latanoprost (XALATAN) 0.005 % ophthalmic solution Place 1 drop into both eyes at bedtime.  07/26/13  Yes Historical Provider, MD  leuprolide (LUPRON) 22.5 MG injection Inject 22.5 mg into the muscle every 3 (three) months.   Yes Historical Provider, MD  lisinopril (PRINIVIL,ZESTRIL) 10 MG tablet Take 1 tablet by mouth daily. 02/26/14  Yes Historical Provider, MD  metoprolol (LOPRESSOR) 100 MG tablet Take 100 mg by mouth daily.    Yes Historical Provider, MD  metoprolol (LOPRESSOR) 50 MG tablet Take 50 mg by mouth at bedtime.   Yes Historical Provider, MD  multivitamin-lutein (OCUVITE-LUTEIN) CAPS Take 1 capsule by mouth 2 (two) times daily.     Yes Historical Provider, MD  omeprazole (PRILOSEC) 20 MG capsule Take 20 mg by mouth daily.  03/27/14  Yes Historical Provider, MD  Tamsulosin HCl (FLOMAX) 0.4 MG CAPS Take 0.4 mg by mouth at bedtime.    Yes Historical Provider, MD  warfarin (COUMADIN) 1 MG tablet Take 5-7 mg by mouth daily. Pt takes  5 mg every day except on Sunday Thursday and Saturday take  two of the 1 mg tablets to equal a total of 7 mg   Yes Historical Provider, MD  amoxicillin (AMOXIL) 500 MG tablet Take 4 tablets by mouth one hour prior to dental appointment Patient not taking: Reported on 11/12/2016 04/18/15   [name], MD    Family History Family History  Problem Relation Age of Onset  . Coronary artery disease    . Heart attack Brother     Social History Social History  Substance Use Topics  . Smoking status: Former Research scientist (life sciences)  . Smokeless tobacco: Never Used     Comment: quit smoking 45 years ago "  . Alcohol use No     Allergies   Tetanus toxoids   Review of Systems Review of Systems 10/14 systems reviewed and are negative other than those stated in the HPI   Physical Exam Updated Vital Signs BP (!) 203/79 (BP Location: Right Arm)   Pulse 64   Temp 97.9 F (36.6 C) (Oral)   Resp 18   Ht 6' (1.829 m)   Wt  182 lb 12.8 oz (82.9 kg)   SpO2 98%   BMI 24.79 kg/m   Physical Exam Physical Exam  Nursing note and vitals reviewed. Constitutional: appears listless, in no acute distress Head: Normocephalic and atraumatic.  Mouth/Throat: Oropharynx is clear and dry.  Neck: Normal range of motion. Neck supple.  Cardiovascular: Normal rate and regular rhythm.  no edema Pulmonary/Chest: Effort normal and breath sounds normal.  Abdominal: Soft. Mild distension.There is no tenderness. There is no rebound and no guarding.  Musculoskeletal: Normal range of motion.  Neurological: Alert, no facial droop, fluent speech, moves all extremities symmetrically Skin: Skin is warm and dry.  Psychiatric: Cooperative   ED Treatments / Results  Labs (all labs ordered are listed, but only abnormal results are displayed) Labs Reviewed  COMPREHENSIVE METABOLIC  PANEL - Abnormal; Notable for the following:       Result Value   Glucose, Bld 124 (*)    BUN 22 (*)    Creatinine, Ser 1.37 (*)    Calcium 8.2 (*)    ALT 15 (*)    Alkaline Phosphatase 172 (*)    GFR calc non Af Amer 44 (*)    GFR calc Af Amer 51 (*)    All other components within normal limits  CBC - Abnormal; Notable for the following:    RBC 4.20 (*)    Hemoglobin 12.5 (*)    All other components within normal limits  URINALYSIS, ROUTINE W REFLEX MICROSCOPIC - Abnormal; Notable for the following:    APPearance HAZY (*)    Hgb urine dipstick SMALL (*)    Protein, ur 30 (*)    Bacteria, UA RARE (*)    Squamous Epithelial / LPF 0-5 (*)    All other components within normal limits  PROTIME-INR - Abnormal; Notable for the following:    Prothrombin Time 28.2 (*)    All other components within normal limits  CBG MONITORING, ED - Abnormal; Notable for the following:    Glucose-Capillary 109 (*)    All other components within normal limits  C DIFFICILE QUICK SCREEN W PCR REFLEX  GASTROINTESTINAL PANEL BY PCR, STOOL (REPLACES STOOL CULTURE)    DIFFERENTIAL  LIPASE, BLOOD  GLUCOSE, CAPILLARY  TROPONIN I  AMMONIA  TSH  COMPREHENSIVE METABOLIC PANEL  CBC WITH DIFFERENTIAL/PLATELET  PROTIME-INR  BLOOD GAS, ARTERIAL    EKG  EKG Interpretation None       Radiology Ct Head Wo Contrast  Result Date: 11/12/2016 CLINICAL DATA:  Confusion, altered mental status and weakness. EXAM: CT HEAD WITHOUT CONTRAST TECHNIQUE: Contiguous axial images were obtained from the base of the skull through the vertex without intravenous contrast. COMPARISON:  CT from 05/22/2014 FINDINGS: BRAIN: There is sulcal and ventricular prominence consistent with superficial and central atrophy. No intraparenchymal hemorrhage, mass effect nor midline shift. Periventricular and subcortical white matter hypodensities consistent with chronic moderate small vessel ischemic disease are identified. Small lacunar infarcts noted of the left basal ganglia and anterior limb of internal capsule, chronic in appearance. No acute large vascular territory infarcts. No abnormal extra-axial fluid collections. Basal cisterns are not effaced and midline. VASCULAR: Moderate calcific atherosclerosis of the carotid siphons. SKULL: No skull fracture. No significant scalp soft tissue swelling. SINUSES/ORBITS: The mastoid air-cells are clear. The included paranasal sinuses are well-aerated.The included ocular globes and orbital contents are non-suspicious. OTHER: None. IMPRESSION: Stable atrophy with moderate chronic microvascular ischemic disease. No acute intracranial process. Electronically Signed   By: Ashley Royalty M.D.   On: 11/12/2016 19:53   Mr Brain Wo Contrast  Result Date: 11/13/2016 CLINICAL DATA:  Increasing confusion for 3 days. Diarrhea today. Assess acute encephalopathy. History of metastatic prostate cancer, hypertension, anti coagulation. EXAM: MRI HEAD WITHOUT CONTRAST TECHNIQUE: Multiplanar, multiecho pulse sequences of the brain and surrounding structures were obtained  without intravenous contrast. COMPARISON:  CT HEAD November 12, 2017 at [place] hours and CT HEAD May 22, 2014 FINDINGS: BRAIN: No reduced diffusion to suggest acute ischemia. No susceptibility artifact to suggest hemorrhage ; a few scattered peripheral microhemorrhages compatible chronic hypertension. The ventricles and sulci are normal for patient's age. No suspicious parenchymal signal, masses or mass effect. Old bilateral cerebellar infarcts. Bold bilateral thalamus and bilateral basal ganglia lacunar infarcts. Patchy to confluent supratentorial and pontine white matter FLAIR T2 hyperintensities.  No abnormal extra-axial fluid collections. No extra-axial masses though, contrast enhanced sequences would be more sensitive. VASCULAR: Major intracranial vascular flow voids present at skull base dolichoectasia compatible chronic hypertension. SKULL AND UPPER CERVICAL SPINE: No abnormal sellar expansion. No suspicious calvarial bone marrow signal. Craniocervical junction maintained. SINUSES/ORBITS: The mastoid air-cells and included paranasal sinuses are well-aerated. Status post bilateral ocular lens implants. The included ocular globes and orbital contents are non-suspicious. OTHER: None. IMPRESSION: No acute intracranial process. Chronic changes include old bilateral basal ganglia, thalamus and cerebellar infarcts, moderate chronic small vessel ischemic disease. Electronically Signed   By: Elon Alas M.D.   On: 11/13/2016 00:24   Dg Chest Portable 1 View  Result Date: 11/12/2016 CLINICAL DATA:  Confusion, altered mental status. EXAM: PORTABLE CHEST 1 VIEW COMPARISON:  08/23/2015 FINDINGS: Postsurgical changes from CABG and valvular replacement, stable. Cardiomediastinal silhouette is normal. Mediastinal contours appear intact. Tortuosity and mild calcific atherosclerotic disease of the aorta. There is no evidence of focal airspace consolidation, pleural effusion or pneumothorax. Osseous structures are without  acute abnormality. Soft tissues are grossly normal. IMPRESSION: No active disease. Calcific atherosclerotic disease of the aorta. Electronically Signed   By: Fidela Salisbury M.D.   On: 11/12/2016 19:29    Procedures Procedures (including critical care time)  Medications Ordered in ED Medications  latanoprost (XALATAN) 0.005 % ophthalmic solution 1 drop (1 drop Both Eyes Given 11/12/16 2310)  hydrALAZINE (APRESOLINE) injection 10 mg (10 mg Intravenous Given 11/13/16 0031)  metoprolol (LOPRESSOR) injection 5 mg (5 mg Intravenous Given 11/12/16 2311)  acetaminophen (TYLENOL) tablet 650 mg (not administered)    Or  acetaminophen (TYLENOL) suppository 650 mg (not administered)  ondansetron (ZOFRAN) tablet 4 mg (not administered)    Or  ondansetron (ZOFRAN) injection 4 mg (not administered)  sodium chloride 0.9 % bolus 1,000 mL (0 mLs Intravenous Stopped 11/12/16 2124)     Initial Impression / Assessment and Plan / ED Course  I have reviewed the triage vital signs and the nursing notes.  Pertinent labs & imaging results that were available during my care of the patient were reviewed by me and considered in my medical decision making (see chart for details).  Clinical Course     Presenting with confusion and diarrhea. On presentation, he is listless and does seem disoriented, but no focal neurological deficits. Mildly hypertensive, but otherwise hemodynamically stable. Does look very dry on exam and concern for dehydration.  Has not made any stool here in the emergency department, but will send C. difficile and stool studies. Blood work without major Research officer, political party or metabolic derangements. he is a stable creatinine. No evidence of UTI. Chest x-Mickey visualized without pneumonia, edema or other acute cardiopulmonary processes. CT visualized and shows no acute intracranial processes.  He did receive 1 L of IV fluids, with still some persistent listlessness and confusion. Plan to admit for ongoing  hydration and monitoring. Discussed with Dr. Hal Hope  Final Clinical Impressions(s) / ED Diagnoses   Final diagnoses:  Diarrhea of presumed infectious origin    New Prescriptions Current Discharge Medication List       Forde Dandy, MD 11/13/16 [phone]

## 2016-11-12 NOTE — ED Notes (Signed)
Pt has not had a bowel movement since in the ED.

## 2016-11-13 DIAGNOSIS — R197 Diarrhea, unspecified: Secondary | ICD-10-CM | POA: Diagnosis present

## 2016-11-13 DIAGNOSIS — G9340 Encephalopathy, unspecified: Secondary | ICD-10-CM | POA: Diagnosis not present

## 2016-11-13 DIAGNOSIS — Z7901 Long term (current) use of anticoagulants: Secondary | ICD-10-CM | POA: Diagnosis not present

## 2016-11-13 DIAGNOSIS — D649 Anemia, unspecified: Secondary | ICD-10-CM | POA: Diagnosis not present

## 2016-11-13 DIAGNOSIS — E860 Dehydration: Secondary | ICD-10-CM | POA: Diagnosis not present

## 2016-11-13 DIAGNOSIS — Z7982 Long term (current) use of aspirin: Secondary | ICD-10-CM | POA: Diagnosis not present

## 2016-11-13 DIAGNOSIS — C61 Malignant neoplasm of prostate: Secondary | ICD-10-CM | POA: Diagnosis not present

## 2016-11-13 DIAGNOSIS — I5032 Chronic diastolic (congestive) heart failure: Secondary | ICD-10-CM | POA: Diagnosis not present

## 2016-11-13 DIAGNOSIS — I130 Hypertensive heart and chronic kidney disease with heart failure and stage 1 through stage 4 chronic kidney disease, or unspecified chronic kidney disease: Secondary | ICD-10-CM | POA: Diagnosis not present

## 2016-11-13 DIAGNOSIS — F0390 Unspecified dementia without behavioral disturbance: Secondary | ICD-10-CM | POA: Diagnosis not present

## 2016-11-13 DIAGNOSIS — I10 Essential (primary) hypertension: Secondary | ICD-10-CM | POA: Diagnosis not present

## 2016-11-13 DIAGNOSIS — C7951 Secondary malignant neoplasm of bone: Secondary | ICD-10-CM

## 2016-11-13 DIAGNOSIS — Z952 Presence of prosthetic heart valve: Secondary | ICD-10-CM | POA: Diagnosis not present

## 2016-11-13 DIAGNOSIS — Z951 Presence of aortocoronary bypass graft: Secondary | ICD-10-CM | POA: Diagnosis not present

## 2016-11-13 DIAGNOSIS — I739 Peripheral vascular disease, unspecified: Secondary | ICD-10-CM | POA: Diagnosis not present

## 2016-11-13 DIAGNOSIS — K219 Gastro-esophageal reflux disease without esophagitis: Secondary | ICD-10-CM | POA: Diagnosis not present

## 2016-11-13 DIAGNOSIS — N183 Chronic kidney disease, stage 3 (moderate): Secondary | ICD-10-CM | POA: Diagnosis not present

## 2016-11-13 DIAGNOSIS — E785 Hyperlipidemia, unspecified: Secondary | ICD-10-CM | POA: Diagnosis not present

## 2016-11-13 DIAGNOSIS — I2510 Atherosclerotic heart disease of native coronary artery without angina pectoris: Secondary | ICD-10-CM | POA: Diagnosis not present

## 2016-11-13 DIAGNOSIS — A09 Infectious gastroenteritis and colitis, unspecified: Secondary | ICD-10-CM | POA: Diagnosis not present

## 2016-11-13 LAB — BLOOD GAS, ARTERIAL
Acid-Base Excess: 0.7 mmol/L (ref 0.0–2.0)
BICARBONATE: 24.9 mmol/L (ref 20.0–28.0)
Drawn by: 40415
FIO2: 21
O2 Saturation: 95.1 %
PH ART: 7.405 (ref 7.350–7.450)
Patient temperature: 98.6
pCO2 arterial: 40.6 mmHg (ref 32.0–48.0)
pO2, Arterial: 78.4 mmHg — ABNORMAL LOW (ref 83.0–108.0)

## 2016-11-13 LAB — CBC WITH DIFFERENTIAL/PLATELET
BASOS ABS: 0 10*3/uL (ref 0.0–0.1)
Basophils Relative: 0 %
EOS ABS: 0.2 10*3/uL (ref 0.0–0.7)
EOS PCT: 3 %
HCT: 37.8 % — ABNORMAL LOW (ref 39.0–52.0)
Hemoglobin: 12.2 g/dL — ABNORMAL LOW (ref 13.0–17.0)
LYMPHS PCT: 21 %
Lymphs Abs: 1.7 10*3/uL (ref 0.7–4.0)
MCH: 29.8 pg (ref 26.0–34.0)
MCHC: 32.3 g/dL (ref 30.0–36.0)
MCV: 92.2 fL (ref 78.0–100.0)
MONO ABS: 0.8 10*3/uL (ref 0.1–1.0)
Monocytes Relative: 10 %
Neutro Abs: 5.6 10*3/uL (ref 1.7–7.7)
Neutrophils Relative %: 66 %
PLATELETS: 151 10*3/uL (ref 150–400)
RBC: 4.1 MIL/uL — ABNORMAL LOW (ref 4.22–5.81)
RDW: 13.8 % (ref 11.5–15.5)
WBC: 8.4 10*3/uL (ref 4.0–10.5)

## 2016-11-13 LAB — COMPREHENSIVE METABOLIC PANEL
ALBUMIN: 3.4 g/dL — AB (ref 3.5–5.0)
ALK PHOS: 164 U/L — AB (ref 38–126)
ALT: 14 U/L — AB (ref 17–63)
AST: 23 U/L (ref 15–41)
Anion gap: 8 (ref 5–15)
BILIRUBIN TOTAL: 0.5 mg/dL (ref 0.3–1.2)
BUN: 21 mg/dL — AB (ref 6–20)
CALCIUM: 8 mg/dL — AB (ref 8.9–10.3)
CO2: 25 mmol/L (ref 22–32)
CREATININE: 1.18 mg/dL (ref 0.61–1.24)
Chloride: 109 mmol/L (ref 101–111)
GFR calc Af Amer: >60 mL/min (ref >60)
GFR, EST NON AFRICAN AMERICAN: 53 mL/min — AB (ref >60)
GLUCOSE: 101 mg/dL — AB (ref 65–99)
POTASSIUM: 4 mmol/L (ref 3.5–5.1)
Sodium: 142 mmol/L (ref 135–145)
TOTAL PROTEIN: 6.5 g/dL (ref 6.5–8.1)

## 2016-11-13 LAB — HEPARIN LEVEL (UNFRACTIONATED): Heparin Unfractionated: 0.14 IU/mL — ABNORMAL LOW (ref 0.30–0.70)

## 2016-11-13 LAB — GLUCOSE, CAPILLARY
GLUCOSE-CAPILLARY: 106 mg/dL — AB (ref 65–99)
GLUCOSE-CAPILLARY: 85 mg/dL (ref 65–99)
GLUCOSE-CAPILLARY: 90 mg/dL (ref 65–99)
Glucose-Capillary: 91 mg/dL (ref 65–99)
Glucose-Capillary: 107 mg/dL — ABNORMAL HIGH (ref 65–99)
Glucose-Capillary: 94 mg/dL (ref 65–99)

## 2016-11-13 LAB — PROTIME-INR
INR: 2.5
Prothrombin Time: 27.5 seconds — ABNORMAL HIGH (ref 11.4–15.2)

## 2016-11-13 LAB — TROPONIN I: Troponin I: <0.03 ng/mL (ref <0.03)

## 2016-11-13 LAB — TSH: TSH: 1.331 u[IU]/mL (ref 0.350–4.500)

## 2016-11-13 LAB — AMMONIA: Ammonia: 30 umol/L (ref 9–35)

## 2016-11-13 MED ORDER — HEPARIN (PORCINE) IN NACL 100-0.45 UNIT/ML-% IJ SOLN
1000.0000 [IU]/h | INTRAMUSCULAR | Status: DC
  Administered 2016-11-13: 1000 [IU]/h via INTRAVENOUS
  Filled 2016-11-13: qty 250

## 2016-11-13 MED ORDER — METOPROLOL TARTRATE 50 MG PO TABS
50.0000 mg | ORAL_TABLET | Freq: Every day | ORAL | Status: DC
  Administered 2016-11-14: 50 mg via ORAL
  Filled 2016-11-13: qty 1

## 2016-11-13 MED ORDER — WARFARIN - PHARMACIST DOSING INPATIENT
Freq: Every day | Status: DC
  Administered 2016-11-13 – 2016-11-14 (×2)

## 2016-11-13 MED ORDER — METOPROLOL TARTRATE 50 MG PO TABS: 50.0000 mg | ORAL_TABLET | Freq: Every day | ORAL | Status: DC

## 2016-11-13 MED ORDER — METOPROLOL TARTRATE 100 MG PO TABS
100.0000 mg | ORAL_TABLET | Freq: Every day | ORAL | Status: DC
  Administered 2016-11-13 – 2016-11-15 (×3): 100 mg via ORAL
  Filled 2016-11-13 (×3): qty 1

## 2016-11-13 MED ORDER — TAMSULOSIN HCL 0.4 MG PO CAPS
0.4000 mg | ORAL_CAPSULE | Freq: Every day | ORAL | Status: DC
  Administered 2016-11-13 – 2016-11-14 (×2): 0.4 mg via ORAL
  Filled 2016-11-13 (×2): qty 1

## 2016-11-13 MED ORDER — PANTOPRAZOLE SODIUM 40 MG PO TBEC
40.0000 mg | DELAYED_RELEASE_TABLET | Freq: Every day | ORAL | Status: DC
  Administered 2016-11-13 – 2016-11-15 (×3): 40 mg via ORAL
  Filled 2016-11-13 (×3): qty 1

## 2016-11-13 MED ORDER — LEUPROLIDE ACETATE (3 MONTH) 22.5 MG IM KIT: 22.5000 mg | PACK | INTRAMUSCULAR | Status: DC

## 2016-11-13 MED ORDER — OCUVITE-LUTEIN PO CAPS
1.0000 | ORAL_CAPSULE | Freq: Two times a day (BID) | ORAL | Status: DC
  Administered 2016-11-13: 1 via ORAL
  Filled 2016-11-13 (×2): qty 1

## 2016-11-13 MED ORDER — LISINOPRIL 10 MG PO TABS
10.0000 mg | ORAL_TABLET | Freq: Every day | ORAL | Status: DC
  Administered 2016-11-13 – 2016-11-15 (×3): 10 mg via ORAL
  Filled 2016-11-13 (×3): qty 1

## 2016-11-13 MED ORDER — DONEPEZIL HCL 5 MG PO TABS
5.0000 mg | ORAL_TABLET | Freq: Every day | ORAL | Status: DC
  Administered 2016-11-13 – 2016-11-14 (×2): 5 mg via ORAL
  Filled 2016-11-13 (×2): qty 1

## 2016-11-13 MED ORDER — HYDRALAZINE HCL 20 MG/ML IJ SOLN
10.0000 mg | Freq: Once | INTRAMUSCULAR | Status: AC
  Administered 2016-11-13: 10 mg via INTRAVENOUS
  Filled 2016-11-13: qty 1

## 2016-11-13 MED ORDER — WARFARIN SODIUM 7.5 MG PO TABS
7.5000 mg | ORAL_TABLET | Freq: Once | ORAL | Status: AC
  Administered 2016-11-13: 7.5 mg via ORAL
  Filled 2016-11-13: qty 1

## 2016-11-13 MED ORDER — SODIUM CHLORIDE 0.9 % IV SOLN
INTRAVENOUS | Status: DC
  Administered 2016-11-13: 15:00:00 via INTRAVENOUS

## 2016-11-13 NOTE — Evaluation (Signed)
Clinical/Bedside Swallow Evaluation Patient Details  Name: Clayton [name] MRN: [id number] Date of Birth: July 28, 1926  Today's Date: 11/13/2016 Time: SLP Start Time (ACUTE ONLY): 1026 SLP Stop Time (ACUTE ONLY): 1036 SLP Time Calculation (min) (ACUTE ONLY): 10 min  Past Medical History:  Past Medical History:  Diagnosis Date  . Aortic aneurysm (HCC)    at least 3 cmm infrarenal AAA by lumbar CT 05/22/13 [facility]); 41 mm proximal ascending aorta on echo 05/05/13 [facility])  . Arthritis   . CAD (coronary artery disease)    s/p CABG 1999  . GERD (gastroesophageal reflux disease)   . H/O hiatal hernia   . HLD (hyperlipidemia)   . HTN (hypertension)   . Peripheral vascular disease ([place])    aneursym   Past Surgical History:  Past Surgical History:  Procedure Laterality Date  . AORTIC VALVE REPLACEMENT  1998  . CHOLECYSTECTOMY    . CORONARY ARTERY BYPASS GRAFT  1998  . HAND SURGERY Left    MULTIPLE   . INGUINAL HERNIA REPAIR  09/25/2013   with mesh  . INGUINAL HERNIA REPAIR Right 09/25/2013   Procedure: HERNIA REPAIR INGUINAL ADULT;  Surgeon: Imogene Burn. Georgette Dover, MD;  Location: [place];  Service: General;  Laterality: Right;  . INSERTION OF MESH Right 09/25/2013   Procedure: INSERTION OF MESH;  Surgeon: Imogene Burn. Georgette Dover, MD;  Location: [place];  Service: General;  Laterality: Right;  . INTRAMEDULLARY (IM) NAIL INTERTROCHANTERIC Right 05/10/2014   Procedure: Open Reduction Internal Fixation Right Hip;  Surgeon: Mcarthur Rossetti, MD;  Location: [place];  Service: Orthopedics;  Laterality: Right;  . TONSILLECTOMY    . US EXTREMITY*L* Left    arm   HPI:  Tukker T [name] a 83 y.o.malewith history of metastatic prostate cancer on hormone therapy, CAD status post CABG and mechanical aortic valve replacement on Coumadin, hypertension, chronic kidney disease stage III, anemia was brought to the ER after patient was found to be increasingly confused over the last 3 days. Patient also had  3-4 episodes of diarrhea.  CT head, CXR, UA unremarkable. Pt is blind.  Swallow evalaution ordered due to lethargy.    Assessment / Plan / Recommendation Clinical Impression  Pt demonstrates normal swallow function. Easily arousable and able to sustain arousal for PO. No signs of aspiration observed. Will initiate a regular diet and thin liquids. No SLP f/u needed. will sign off.     Aspiration Risk  Mild aspiration risk    Diet Recommendation Regular;Thin liquid   Liquid Administration via: Cup;Straw Medication Administration: Whole meds with liquid Supervision: Patient able to self feed Compensations: Slow rate;Small sips/bites Postural Changes: Seated upright at 90 degrees    Other  Recommendations     Follow up Recommendations        Frequency and Duration            Prognosis        Swallow Study   General HPI: Clayton [name] a 83 y.o.malewith history of metastatic prostate cancer on hormone therapy, CAD status post CABG and mechanical aortic valve replacement on Coumadin, hypertension, chronic kidney disease stage III, anemia was brought to the ER after patient was found to be increasingly confused over the last 3 days. Patient also had 3-4 episodes of diarrhea.  CT head, CXR, UA unremarkable. Pt is blind.  Swallow evalaution ordered due to lethargy.  Type of Study: Bedside Swallow Evaluation Diet Prior to this Study: NPO Temperature Spikes Noted: No Respiratory Status:  Room air History of Recent Intubation: No Behavior/Cognition: Alert;Cooperative;Pleasant mood Oral Cavity Assessment: Within Functional Limits Oral Care Completed by SLP: No Oral Cavity - Dentition: Adequate natural dentition Vision: Impaired for self-feeding (pt blind ) Self-Feeding Abilities: Able to feed self Patient Positioning: Upright in bed Baseline Vocal Quality: Normal Volitional Cough: Strong Volitional Swallow: Able to elicit    Oral/Motor/Sensory Function Overall Oral Motor/Sensory  Function: Within functional limits   Ice Chips     Thin Liquid Thin Liquid: Within functional limits Presentation: Cup;Straw    Nectar Thick Nectar Thick Liquid: Not tested   Honey Thick Honey Thick Liquid: Not tested   Puree Puree: Within functional limits Presentation: Spoon   Solid   GO   Solid: Within functional limits Presentation: Self Fed    Functional Assessment Tool Used: clinical judgement Functional Limitations: Swallowing Swallow Current Status [id number]): 0 percent impaired, limited or restricted Swallow Goal Status [id number]): 0 percent impaired, limited or restricted Swallow Discharge Status [phone]): 0 percent impaired, limited or restricted  Herbie Baltimore, MA CCC-SLP [phone]  Quanisha Drewry, Katherene Ponto 11/13/2016,10:41 AM

## 2016-11-13 NOTE — Progress Notes (Signed)
ANTICOAGULATION CONSULT NOTE - Follow Up Consult  Pharmacy Consult for Warfarin Indication: Mechanical AVR  Allergies  Allergen Reactions  . Tetanus Toxoids Hives    Patient Measurements: Height: 6' (182.9 cm) Weight: 179 lb 0.2 oz (81.2 kg) IBW/kg (Calculated) : 77.6 Heparin Dosing Weight: 81 kg  Vital Signs: Temp: 97.7 F (36.5 C) (01/05 1455) Temp Source: Oral (01/05 1455) BP: 163/64 (01/05 1455) Pulse Rate: 74 (01/05 1455)  Labs:  Recent Labs  11/12/16 1658 11/12/16 1957 11/13/16 0100 11/13/16 1336  HGB 12.5*  --  12.2*  --   HCT 39.1  --  37.8*  --   PLT 206  --  151  --   LABPROT  --  28.2* 27.5*  --   INR  --  2.58 2.50  --   HEPARINUNFRC  --   --   --  0.14*  CREATININE 1.37*  --  1.18  --   TROPONINI  --   --  <0.03  --     Estimated Creatinine Clearance: 47.5 mL/min (by C-G formula based on SCr of 1.18 mg/dL).   Medications:   Assessment: 89 YOM on warfarin PTA for hx mech AVR held on admission and bridged with heparin while NPO. The patient is now taking orals and plans are to resume warfarin.   The patient's PTA dose 5 mg daily EXCEPT for 7 mg on Thurs/Sat/Sun. The patient's dose was on 1/3 - the dose was missed yesterday evening. INR today remains therapeutic at 2.5 however may drop given the missed dose. The patient has been NPO most of the day which may increase the warfarin sensitivity.   Goal of Therapy:  INR 2.5-3.5 Monitor platelets by anticoagulation protocol: Yes   Plan:  1. Warfarin 7.5 mg x 1 dose at 1800 today 2. Daily PT/INR 3. Will continue to monitor for any signs/symptoms of bleeding and will follow up with PT/INR in the a.m.   Thank you for allowing pharmacy to be a part of this patient's care.  Alycia Rossetti, PharmD, BCPS Clinical Pharmacist Pager: [phone] Clinical phone for 11/13/2016 from 7a-3:30p: [phone] If after 3:30p, please call main pharmacy at: [phone] 11/13/2016 3:08 PM

## 2016-11-13 NOTE — Clinical Social Work Note (Signed)
Clinical Social Work Assessment  Patient Details  Name: Clayton [name] MRN: [id number] Date of Birth: 02-21-1926  Date of referral:  11/13/16               Reason for consult:  Facility Placement                Permission sought to share information with:  Facility Sport and exercise psychologist, Family Supports Permission granted to share information::  Yes, Verbal Permission Granted  Name::        Agency::  SNFs  Relationship::     Contact Information:     Housing/Transportation Living arrangements for the past 2 months:  Single Family Home Source of Information:  Spouse Patient Interpreter Needed:  None Criminal Activity/Legal Involvement Pertinent to Current Situation/Hospitalization:  No - Comment as needed Significant Relationships:  Adult Children, Spouse Lives with:  Spouse Do you feel safe going back to the place where you live?    Need for family participation in patient care:  Yes (Comment)  Care giving concerns:  CSW received consult for possible SNF placement at time of discharge. CSW met with patient and patient's spouse at bedside regarding PT recommendation of SNF placement at time of discharge. Per patient's spouse, patient's spouse is currently unable to care for patient at their home given patient's current physical needs and fall risk. Patient and patient's spouse expressed understanding of PT recommendation and are agreeable to SNF placement at time of discharge. CSW to continue to follow and assist with discharge planning needs.   Social Worker assessment / plan:  CSW spoke with patient and patient's spouse concerning possibility of rehab at [facility] before returning home. CSW explained insurance barriers Personnel officer) for weekend discharges.   Employment status:  Retired Nurse, adult PT Recommendations:  [place] / Referral to community resources:  [place]  Patient/Family's Response to care:  Patient and  patient's spouse recognize need for rehab before returning home and are agreeable to a SNF in [place]. Patient's spouse in agreement to go to [facility] and rehab.  Patient/Family's Understanding of and Emotional Response to Diagnosis, Current Treatment, and Prognosis:  Patient/family is realistic regarding therapy needs and expressed being hopeful for SNF placement. Patient's spouse expressed understanding of CSW role and discharge process. No questions/concerns about plan or treatment.    Emotional Assessment Appearance:  Appears stated age Attitude/Demeanor/Rapport:  Unable to Assess Affect (typically observed):  Unable to Assess Orientation:  Oriented to Self Alcohol / Substance use:  Not Applicable Psych involvement (Current and /or in the community):  No (Comment)  Discharge Needs  Concerns to be addressed:  No discharge needs identified Readmission within the last 30 days:  No Current discharge risk:  Physical Impairment Barriers to Discharge:  Continued Medical Work up   [employer], [place] 11/13/2016, 5:21 PM

## 2016-11-13 NOTE — Progress Notes (Signed)
PROGRESS NOTE                                                                                                                                                                                                             Patient Demographics:    Clayton [name], is a 83 y.o. male, DOB - 1926-07-17, [id number]  Admit date - 11/12/2016   Admitting Physician Rise Patience, MD  Outpatient Primary MD for the patient is Clayton [name], MD  LOS - 0    Chief Complaint  Patient presents with  . Diarrhea       Brief Narrative   83 y.o. male with history of metastatic prostate cancer on hormone therapy, CAD status post CABG and mechanical aortic valve replacement on Coumadin, hypertension, chronic kidney disease stage III, anemia was brought to the ER after patient was found to be increasingly confused over the last 3 days. Patient also had 3-4 episodes of diarrhea  Subjective:    [name] today has, No headache, No chest pain, No abdominal pain - No Nausea, No further diarrhea  Assessment  & Plan :    Principal Problem:   Acute encephalopathy Active Problems:   Essential hypertension   Dementia without behavioral disturbance   Chronic diastolic heart failure (HCC)   Prostate cancer metastatic to bone (HCC)   Dehydration   Diarrhea of presumed infectious origin   Acute encephalopathy  - Unclear etiology, possibly due to dehydration from diarrhea, MRI brain with no evidence of acute findings, ABG with no evidence of CO2 retention, appears to be improving, family at bedside reports patient is back to baseline.  Diarrhea  - has not used any recent antibiotics. No further diarrhea since admission, unable to send GI panel yet - Continue with IV fluids of 24 hours for dehydration   Mechanical aortic valve -On warfarin, pharmacy to dose  Hypertension  - Uncontrolled as was not able to take his medication secondary to encephalopathy, currently back to  baseline, cleared by SLP, resume back on home meds, continue with when necessary hydralazine .  CAD status post CABG  - has not complained of any chest pain.  Metastatic prostate cancer - on hormone therapy per oncologist.  Chronic anemia  - follow CBC.  Chronic kidney disease stage III  - creatinine appears to be at baseline.  Code Status : DNR  Family Communication  : Wife and both sons at bedside  Disposition Plan  : likely will need SNF  Consults  :  None  Procedures  : None  DVT Prophylaxis  : warfarin   Lab Results  Component Value Date   PLT 151 11/13/2016    Antibiotics  :    Anti-infectives    None        Objective:   Vitals:   11/13/16 0208 11/13/16 0515 11/13/16 1227 11/13/16 1455  BP: (!) 156/63 (!) 179/84 (!) 191/85 (!) 163/64  Pulse: 87 91 88 74  Resp:  20    Temp:  98.1 F (36.7 C)  97.7 F (36.5 C)  TempSrc:  Oral  Oral  SpO2:  99%  95%  Weight:  81.2 kg (179 lb 0.2 oz)    Height:        Wt Readings from Last 3 Encounters:  11/13/16 81.2 kg (179 lb 0.2 oz)  06/25/16 90.4 kg (199 lb 3.2 oz)  10/21/15 90.3 kg (199 lb)     Intake/Output Summary (Last 24 hours) at 11/13/16 1539 Last data filed at 11/13/16 1517  Gross per 24 hour  Intake          1086.67 ml  Output              750 ml  Net           336.67 ml     Physical Exam  Awake Alert, Communicative Legally blind Supple Neck,No JVD,  Symmetrical Chest wall movement, Good air movement bilaterally, CTAB RRR,No Gallops,Rubs or new Murmurs, No Parasternal Heave +ve B.Sounds, Abd Soft, No tenderness, No rebound - guarding or rigidity. No Cyanosis, Clubbing or edema, No new Rash or bruise      Data Review:    CBC  Recent Labs Lab 11/12/16 1658 11/12/16 1957 11/13/16 0100  WBC 8.6  --  8.4  HGB 12.5*  --  12.2*  HCT 39.1  --  37.8*  PLT 206  --  151  MCV 93.1  --  92.2  MCH 29.8  --  29.8  MCHC 32.0  --  32.3  RDW 13.8  --  13.8  LYMPHSABS  --  1.5 1.7    MONOABS  --  0.8 0.8  EOSABS  --  0.2 0.2  BASOSABS  --  0.0 0.0    Chemistries   Recent Labs Lab 11/12/16 1658 11/13/16 0100  NA 141 142  K 3.9 4.0  CL 107 109  CO2 26 25  GLUCOSE 124* 101*  BUN 22* 21*  CREATININE 1.37* 1.18  CALCIUM 8.2* 8.0*  AST 24 23  ALT 15* 14*  ALKPHOS 172* 164*  BILITOT 0.4 0.5   ------------------------------------------------------------------------------------------------------------------ No results for input(s): CHOL, HDL, LDLCALC, TRIG, CHOLHDL, LDLDIRECT in the last 72 hours.  No results found for: HGBA1C ------------------------------------------------------------------------------------------------------------------  Recent Labs  11/13/16 0100  TSH 1.331   ------------------------------------------------------------------------------------------------------------------ No results for input(s): VITAMINB12, FOLATE, FERRITIN, TIBC, IRON, RETICCTPCT in the last 72 hours.  Coagulation profile  Recent Labs Lab 11/12/16 1957 11/13/16 0100  INR 2.58 2.50    No results for input(s): DDIMER in the last 72 hours.  Cardiac Enzymes  Recent Labs Lab 11/13/16 0100  TROPONINI <0.03   ------------------------------------------------------------------------------------------------------------------ No results found for: BNP  Inpatient Medications  Scheduled Meds: . latanoprost  1 drop Both Eyes QHS  . metoprolol  5 mg Intravenous Q6H  . warfarin  7.5  mg Oral ONCE-1800  . Warfarin - Pharmacist Dosing Inpatient   Does not apply q1800   Continuous Infusions: . sodium chloride 75 mL/hr at 11/13/16 1517   PRN Meds:.acetaminophen **OR** acetaminophen, hydrALAZINE, ondansetron **OR** ondansetron (ZOFRAN) IV  Micro Results No results found for this or any previous visit (from the past 240 hour(s)).  Radiology Reports Ct Head Wo Contrast  Result Date: 11/12/2016 CLINICAL DATA:  Confusion, altered mental status and weakness. EXAM:  CT HEAD WITHOUT CONTRAST TECHNIQUE: Contiguous axial images were obtained from the base of the skull through the vertex without intravenous contrast. COMPARISON:  CT from 05/22/2014 FINDINGS: BRAIN: There is sulcal and ventricular prominence consistent with superficial and central atrophy. No intraparenchymal hemorrhage, mass effect nor midline shift. Periventricular and subcortical white matter hypodensities consistent with chronic moderate small vessel ischemic disease are identified. Small lacunar infarcts noted of the left basal ganglia and anterior limb of internal capsule, chronic in appearance. No acute large vascular territory infarcts. No abnormal extra-axial fluid collections. Basal cisterns are not effaced and midline. VASCULAR: Moderate calcific atherosclerosis of the carotid siphons. SKULL: No skull fracture. No significant scalp soft tissue swelling. SINUSES/ORBITS: The mastoid air-cells are clear. The included paranasal sinuses are well-aerated.The included ocular globes and orbital contents are non-suspicious. OTHER: None. IMPRESSION: Stable atrophy with moderate chronic microvascular ischemic disease. No acute intracranial process. Electronically Signed   By: Ashley Royalty M.D.   On: 11/12/2016 19:53   Mr Brain Wo Contrast  Result Date: 11/13/2016 CLINICAL DATA:  Increasing confusion for 3 days. Diarrhea today. Assess acute encephalopathy. History of metastatic prostate cancer, hypertension, anti coagulation. EXAM: MRI HEAD WITHOUT CONTRAST TECHNIQUE: Multiplanar, multiecho pulse sequences of the brain and surrounding structures were obtained without intravenous contrast. COMPARISON:  CT HEAD November 12, 2017 at [place] hours and CT HEAD May 22, 2014 FINDINGS: BRAIN: No reduced diffusion to suggest acute ischemia. No susceptibility artifact to suggest hemorrhage ; a few scattered peripheral microhemorrhages compatible chronic hypertension. The ventricles and sulci are normal for patient's age. No  suspicious parenchymal signal, masses or mass effect. Old bilateral cerebellar infarcts. Bold bilateral thalamus and bilateral basal ganglia lacunar infarcts. Patchy to confluent supratentorial and pontine white matter FLAIR T2 hyperintensities. No abnormal extra-axial fluid collections. No extra-axial masses though, contrast enhanced sequences would be more sensitive. VASCULAR: Major intracranial vascular flow voids present at skull base dolichoectasia compatible chronic hypertension. SKULL AND UPPER CERVICAL SPINE: No abnormal sellar expansion. No suspicious calvarial bone marrow signal. Craniocervical junction maintained. SINUSES/ORBITS: The mastoid air-cells and included paranasal sinuses are well-aerated. Status post bilateral ocular lens implants. The included ocular globes and orbital contents are non-suspicious. OTHER: None. IMPRESSION: No acute intracranial process. Chronic changes include old bilateral basal ganglia, thalamus and cerebellar infarcts, moderate chronic small vessel ischemic disease. Electronically Signed   By: Elon Alas M.D.   On: 11/13/2016 00:24   Dg Chest Portable 1 View  Result Date: 11/12/2016 CLINICAL DATA:  Confusion, altered mental status. EXAM: PORTABLE CHEST 1 VIEW COMPARISON:  08/23/2015 FINDINGS: Postsurgical changes from CABG and valvular replacement, stable. Cardiomediastinal silhouette is normal. Mediastinal contours appear intact. Tortuosity and mild calcific atherosclerotic disease of the aorta. There is no evidence of focal airspace consolidation, pleural effusion or pneumothorax. Osseous structures are without acute abnormality. Soft tissues are grossly normal. IMPRESSION: No active disease. Calcific atherosclerotic disease of the aorta. Electronically Signed   By: Fidela Salisbury M.D.   On: 11/12/2016 19:29     [facility], Cicely Ortner M.D on 11/13/2016  at 3:39 PM  Between 7am to 7pm - Pager - [phone]  After 7pm go to [email or web address] - password  [facility]  [facility] -  Office  [phone]

## 2016-11-13 NOTE — Progress Notes (Signed)
PT Cancellation Note  Patient Details Name: Clayton [name] MRN: [id number] DOB: October 03, 1926   Cancelled Treatment:    Reason Eval/Treat Not Completed: Patient's level of consciousness (Lethargic and cannot keep eyes open, Ck later.)   Ramond Dial 11/13/2016, 10:27 AM   Mee Hives, PT MS Acute Rehab Dept. Number: [place]

## 2016-11-13 NOTE — Progress Notes (Signed)
PT Cancellation Note  Patient Details Name: Clayton [name] MRN: [id number] DOB: Aug 19, 1926   Cancelled Treatment:    Reason Eval/Treat Not Completed: Patient's level of consciousness (Despite waking up for ST was asleep again.)  Try later if time allows.   Ramond Dial 11/13/2016, 12:50 PM

## 2016-11-13 NOTE — Progress Notes (Signed)
Patients BP 195/77 after PRN hydralazine given.  On-call provider, NP Baltazar Najjar notified.  Will continue to monitor patient and notify NP as needed.

## 2016-11-13 NOTE — Progress Notes (Signed)
Patient's BP following Hydralazine 10 mg was 188/68. MD Eulas Post notified and decreased IV fluid to 10 ml/hr and an additional dose of hydralazine 10 mg.  Will administer the orders, monitor patient, and notify MD as needed.

## 2016-11-13 NOTE — Progress Notes (Signed)
NURSING PROGRESS NOTE  SARAH PURK [id number] Admission Data: 11/13/2016 1:00 AM Attending Provider: Rise Patience, MD [id number], MD Code Status: DNR  Allergies:  Tetanus toxoids Past Medical History:   has a past medical history of Aortic aneurysm ([place]); Arthritis; CAD (coronary artery disease); GERD (gastroesophageal reflux disease); H/O hiatal hernia; HLD (hyperlipidemia); HTN (hypertension); and Peripheral vascular disease ([place]). Past Surgical History:   has a past surgical history that includes Aortic valve replacement (1998); Coronary artery bypass graft (1998); Cholecystectomy; Tonsillectomy; US EXTREMITY*L* (Left); Inguinal hernia repair (09/25/2013); Inguinal hernia repair (Right, 09/25/2013); Insertion of mesh (Right, 09/25/2013); Hand surgery (Left); and Intramedullary (im) nail intertrochanteric (Right, 05/10/2014). Social History:   reports that he has quit smoking. He has never used smokeless tobacco. He reports that he does not drink alcohol or use drugs.  Clayton [name] is a 83 y.o. male patient admitted from ED:   Last Documented Vital Signs: Blood pressure (!) 203/79, pulse 64, temperature 97.9 F (36.6 C), temperature source Oral, resp. rate 18, height 6' (1.829 m), weight 82.9 kg (182 lb 12.8 oz), SpO2 98 %.  Cardiac Monitoring: Box # 34 in place. Cardiac monitor yields:normal sinus rhythm.  IV Fluids:  IV in place, occlusive dsg intact without redness, IV cath antecubital right, condition patent and no redness none.   Skin: Appropriate for ethnicity with bruising on the right arm and hand.  Patient/Family orientated to room.  Admission inpatient armband information verified with patient to include name and date of birth and placed on patient arm. Side rails up x 2, fall assessment and education completed with patient. Patient able to verbalize understanding of risk associated with falls and verbalized understanding to call for assistance before getting out of bed.  Call light within reach. Patient able to voice and demonstrate understanding of unit orientation instructions.    Will continue to evaluate and treat per MD orders.  Clydell Hakim RN, BSN

## 2016-11-13 NOTE — Evaluation (Signed)
Physical Therapy Evaluation Patient Details Name: Clayton [name] MRN: [id number] DOB: 12-23-1925 Today's Date: 11/13/2016   History of Present Illness  Pt adm with confusion and lethargy. PMH - rt hip fx, blind, prostate CA, CAD, CABG, HTN, CKD  Clinical Impression  Pt admitted with above diagnosis and presents to PT with functional limitations due to deficits listed below (See PT problem list). Pt needs skilled PT to maximize independence and safety to allow discharge to SNF prior to return home with wife. Pt's wife with recent pelvic fx and unable to physically assist. Currently pt very lethargic and only briefly arouses. Expect as lethargy improves his mobility will begin to improve.     Follow Up Recommendations SNF    Equipment Recommendations  None recommended by PT    Recommendations for Other Services       Precautions / Restrictions Precautions Precautions: Fall Restrictions Weight Bearing Restrictions: No      Mobility  Bed Mobility Overal bed mobility: Needs Assistance Bed Mobility: Supine to Sit;Sit to Supine     Supine to sit: Max assist;Mod assist Sit to supine: Max assist   General bed mobility comments: Max assist to move legs off EOB. Once legs off EOB mod A to elevate trunk into sitting. Max Assist to bring hips to EOB. Assist to bring legs back up into bed and to lower trunk.  Transfers                 General transfer comment: Unable to maintain adeqaute arousal to attempt.  Ambulation/Gait                Stairs            Wheelchair Mobility    Modified Rankin (Stroke Patients Only)       Balance Overall balance assessment: Needs assistance Sitting-balance support: Bilateral upper extremity supported;Feet supported Sitting balance-Leahy Scale: Poor Sitting balance - Comments: min guard to mod A. As pt back to sleep he begins falling backwards. Postural control: Posterior lean                                    Pertinent Vitals/Pain Pain Assessment: No/denies pain    Home Living Family/patient expects to be discharged to:: Skilled nursing facility Living Arrangements: Spouse/significant other Available Help at Discharge: [place] Type of Home: House Home Access: Level entry     Home Layout: One level Home Equipment: Environmental consultant - 2 wheels;Walker - 4 wheels;Cane - single point;Wheelchair - manual Additional Comments: Wife fell several weeks ago and has pelvic fx and can't currently assist pt.    Prior Function Level of Independence: Needs assistance   Gait / Transfers Assistance Needed: Pt non ambulatory. Pt stand pivots to w/c with assistance of wife until wife fell and fx'd her pelvis several weeks ago.           Hand Dominance   Dominant Hand: Right    Extremity/Trunk Assessment   Upper Extremity Assessment Upper Extremity Assessment: Generalized weakness;Difficult to assess due to impaired cognition    Lower Extremity Assessment Lower Extremity Assessment: Generalized weakness;Difficult to assess due to impaired cognition       Communication   Communication: HOH  Cognition Arousal/Alertness: Lethargic Behavior During Therapy: Flat affect Overall Cognitive Status: Difficult to assess                 General Comments: Pt would  arouse briefly with loud verbal stimuli and tactile stimuli. Needed constant stimuli to participate.    General Comments      Exercises     Assessment/Plan    PT Assessment Patient needs continued PT services  PT Problem List Decreased strength;Decreased activity tolerance;Decreased balance;Decreased mobility;Decreased cognition          PT Treatment Interventions DME instruction;Functional mobility training;Therapeutic activities;Therapeutic exercise;Balance training;Patient/family education    PT Goals (Current goals can be found in the Care Plan section)  Acute Rehab PT Goals Patient Stated Goal: Pt unable  to state. Pt's wife wants him to go to rehab to get stronger prior to return home. PT Goal Formulation: With family Time For Goal Achievement: 11/27/16 Potential to Achieve Goals: Fair    Frequency Min 2X/week   Barriers to discharge Decreased caregiver support wife now with pelvic fx    Co-evaluation               End of Session   Activity Tolerance: Patient limited by lethargy Patient left: in bed;with call bell/phone within reach;with bed alarm set;with family/visitor present      Functional Assessment Tool Used: clinical judgement Functional Limitation: Mobility: Walking and moving around Mobility: Walking and Moving Around Current Status [id number]): At least 80 percent but less than 100 percent impaired, limited or restricted Mobility: Walking and Moving Around Goal Status [phone]): At least 20 percent but less than 40 percent impaired, limited or restricted    Time: 1546-1605 PT Time Calculation (min) (ACUTE ONLY): 19 min   Charges:   PT Evaluation $PT Eval Moderate Complexity: 1 Procedure     PT G Codes:   PT G-Codes **NOT FOR INPATIENT CLASS** Functional Assessment Tool Used: clinical judgement Functional Limitation: Mobility: Walking and moving around Mobility: Walking and Moving Around Current Status [id number]): At least 80 percent but less than 100 percent impaired, limited or restricted Mobility: Walking and Moving Around Goal Status [phone]): At least 20 percent but less than 40 percent impaired, limited or restricted    [place] 11/13/2016, 4:29 PM [employer] PT [phone]

## 2016-11-14 DIAGNOSIS — I10 Essential (primary) hypertension: Secondary | ICD-10-CM

## 2016-11-14 DIAGNOSIS — R197 Diarrhea, unspecified: Secondary | ICD-10-CM | POA: Diagnosis not present

## 2016-11-14 DIAGNOSIS — G9340 Encephalopathy, unspecified: Secondary | ICD-10-CM | POA: Diagnosis not present

## 2016-11-14 DIAGNOSIS — E860 Dehydration: Secondary | ICD-10-CM | POA: Diagnosis not present

## 2016-11-14 LAB — GLUCOSE, CAPILLARY
GLUCOSE-CAPILLARY: 104 mg/dL — AB (ref 65–99)
GLUCOSE-CAPILLARY: 112 mg/dL — AB (ref 65–99)
GLUCOSE-CAPILLARY: 115 mg/dL — AB (ref 65–99)
Glucose-Capillary: 92 mg/dL (ref 65–99)
Glucose-Capillary: 150 mg/dL — ABNORMAL HIGH (ref 65–99)

## 2016-11-14 LAB — C DIFFICILE QUICK SCREEN W PCR REFLEX
C DIFFICILE (CDIFF) TOXIN: NEGATIVE
C DIFFICLE (CDIFF) ANTIGEN: NEGATIVE
C Diff interpretation: NOT DETECTED

## 2016-11-14 LAB — GASTROINTESTINAL PANEL BY PCR, STOOL (REPLACES STOOL CULTURE)
ASTROVIRUS: NOT DETECTED
Adenovirus F40/41: NOT DETECTED
Campylobacter species: NOT DETECTED
Cryptosporidium: NOT DETECTED
Cyclospora cayetanensis: NOT DETECTED
ENTAMOEBA HISTOLYTICA: NOT DETECTED
ENTEROAGGREGATIVE E COLI (EAEC): NOT DETECTED
ENTEROPATHOGENIC E COLI (EPEC): NOT DETECTED
Enterotoxigenic E coli (ETEC): NOT DETECTED
GIARDIA LAMBLIA: NOT DETECTED
NOROVIRUS GI/GII: NOT DETECTED
Plesimonas shigelloides: NOT DETECTED
Rotavirus A: NOT DETECTED
SAPOVIRUS (I, II, IV, AND V): NOT DETECTED
SHIGA LIKE TOXIN PRODUCING E COLI (STEC): NOT DETECTED
Salmonella species: NOT DETECTED
Shigella/Enteroinvasive E coli (EIEC): NOT DETECTED
VIBRIO CHOLERAE: NOT DETECTED
Vibrio species: NOT DETECTED
Yersinia enterocolitica: NOT DETECTED

## 2016-11-14 LAB — PROTIME-INR
INR: 2.35
Prothrombin Time: 26.1 seconds — ABNORMAL HIGH (ref 11.4–15.2)

## 2016-11-14 MED ORDER — PROSIGHT PO TABS
1.0000 | ORAL_TABLET | Freq: Two times a day (BID) | ORAL | Status: DC
  Administered 2016-11-14 – 2016-11-15 (×3): 1 via ORAL
  Filled 2016-11-14 (×3): qty 1

## 2016-11-14 MED ORDER — WARFARIN SODIUM 7.5 MG PO TABS
7.5000 mg | ORAL_TABLET | Freq: Once | ORAL | Status: AC
  Administered 2016-11-14: 7.5 mg via ORAL
  Filled 2016-11-14: qty 1

## 2016-11-14 NOTE — Progress Notes (Signed)
PROGRESS NOTE                                                                                                                                                                                                             Patient Demographics:    Clayton [name], is a 83 y.o. male, DOB - April 14, 1926, [id number]  Admit date - 11/12/2016   Admitting Physician Rise Patience, MD  Outpatient Primary MD for the patient is [name], MD  LOS - 1    Chief Complaint  Patient presents with  . Diarrhea       Brief Narrative   83 y.o. male with history of metastatic prostate cancer on hormone therapy, CAD status post CABG and mechanical aortic valve replacement on Coumadin, hypertension, chronic kidney disease stage III, anemia was brought to the ER after patient was found to be increasingly confused over the last 3 days. Patient also had 3-4 episodes of diarrhea  Subjective:    Clayton [name] today has, No headache, No chest pain, No abdominal pain - No Nausea, No further diarrhea  Assessment  & Plan :    Principal Problem:   Acute encephalopathy Active Problems:   Essential hypertension   Dementia without behavioral disturbance   Chronic diastolic heart failure (HCC)   Prostate cancer metastatic to bone (HCC)   Dehydration   Diarrhea of presumed infectious origin   Acute encephalopathy  - Unclear etiology, possibly due to dehydration from diarrhea, MRI brain with no evidence of acute findings, ABG with no evidence of CO2 retention, appears to be improving, family at bedside reports patient is back to baseline.  Diarrhea  - has not used any recent antibiotics. No further diarrhea since admission, unable to send GI panel yet - On IV fluids for dehydration, has been stopped giving uncontrolled blood pressure  Mechanical aortic valve -On warfarin, pharmacy to dose  Hypertension  - Uncontrolled as was not able to take his medication secondary to  encephalopathy, currently back to baseline, cleared by SLP, resume back on home meds, continue with when necessary hydralazine .  CAD status post CABG  - has not complained of any chest pain.  Metastatic prostate cancer - on hormone therapy per oncologist.  Chronic anemia  - follow CBC.  Chronic kidney disease stage III  - creatinine appears to be at baseline.  Code Status : DNR  Family Communication  : none at bedside  Disposition Plan  : SNF when bed is available  Consults  :  None  Procedures  : None  DVT Prophylaxis  : warfarin   Lab Results  Component Value Date   PLT 151 11/13/2016    Antibiotics  :    Anti-infectives    None        Objective:   Vitals:   11/14/16 0013 11/14/16 0406 11/14/16 0410 11/14/16 0916  BP: (!) 152/59  (!) 149/60 (!) 150/63  Pulse: 78  85 88  Resp:   18   Temp:   98.1 F (36.7 C)   TempSrc:      SpO2:   96%   Weight:  100 kg (220 lb 7.4 oz)    Height:        Wt Readings from Last 3 Encounters:  11/14/16 100 kg (220 lb 7.4 oz)  06/25/16 90.4 kg (199 lb 3.2 oz)  10/21/15 90.3 kg (199 lb)     Intake/Output Summary (Last 24 hours) at 11/14/16 1241 Last data filed at 11/14/16 1029  Gross per 24 hour  Intake          1008.33 ml  Output              550 ml  Net           458.33 ml     Physical Exam  Awake Alert, Communicative Legally blind Supple Neck,No JVD,  Symmetrical Chest wall movement, Good air movement bilaterally, CTAB RRR,No Gallops,Rubs or new Murmurs, No Parasternal Heave +ve B.Sounds, Abd Soft, No tenderness, No rebound - guarding or rigidity. No Cyanosis, Clubbing or edema, No new Rash or bruise      Data Review:    CBC  Recent Labs Lab 11/12/16 1658 11/12/16 1957 11/13/16 0100  WBC 8.6  --  8.4  HGB 12.5*  --  12.2*  HCT 39.1  --  37.8*  PLT 206  --  151  MCV 93.1  --  92.2  MCH 29.8  --  29.8  MCHC 32.0  --  32.3  RDW 13.8  --  13.8  LYMPHSABS  --  1.5 1.7  MONOABS  --  0.8  0.8  EOSABS  --  0.2 0.2  BASOSABS  --  0.0 0.0    Chemistries   Recent Labs Lab 11/12/16 1658 11/13/16 0100  NA 141 142  K 3.9 4.0  CL 107 109  CO2 26 25  GLUCOSE 124* 101*  BUN 22* 21*  CREATININE 1.37* 1.18  CALCIUM 8.2* 8.0*  AST 24 23  ALT 15* 14*  ALKPHOS 172* 164*  BILITOT 0.4 0.5   ------------------------------------------------------------------------------------------------------------------ No results for input(s): CHOL, HDL, LDLCALC, TRIG, CHOLHDL, LDLDIRECT in the last 72 hours.  No results found for: HGBA1C ------------------------------------------------------------------------------------------------------------------  Recent Labs  11/13/16 0100  TSH 1.331   ------------------------------------------------------------------------------------------------------------------ No results for input(s): VITAMINB12, FOLATE, FERRITIN, TIBC, IRON, RETICCTPCT in the last 72 hours.  Coagulation profile  Recent Labs Lab 11/12/16 1957 11/13/16 0100 11/14/16 0802  INR 2.58 2.50 2.35    No results for input(s): DDIMER in the last 72 hours.  Cardiac Enzymes  Recent Labs Lab 11/13/16 0100  TROPONINI <0.03   ------------------------------------------------------------------------------------------------------------------ No results found for: BNP  Inpatient Medications  Scheduled Meds: . donepezil  5 mg Oral QHS  . latanoprost  1 drop Both Eyes QHS  . lisinopril  10 mg Oral Daily  .  metoprolol  100 mg Oral Daily  . metoprolol  50 mg Oral QHS  . multivitamin  1 tablet Oral BID  . pantoprazole  40 mg Oral Daily  . tamsulosin  0.4 mg Oral QHS  . warfarin  7.5 mg Oral ONCE-1800  . Warfarin - Pharmacist Dosing Inpatient   Does not apply q1800   Continuous Infusions: . sodium chloride 10 mL/hr at 11/13/16 2335   PRN Meds:.acetaminophen **OR** acetaminophen, hydrALAZINE, ondansetron **OR** ondansetron (ZOFRAN) IV  Micro Results No results found  for this or any previous visit (from the past 240 hour(s)).  Radiology Reports Ct Head Wo Contrast  Result Date: 11/12/2016 CLINICAL DATA:  Confusion, altered mental status and weakness. EXAM: CT HEAD WITHOUT CONTRAST TECHNIQUE: Contiguous axial images were obtained from the base of the skull through the vertex without intravenous contrast. COMPARISON:  CT from 05/22/2014 FINDINGS: BRAIN: There is sulcal and ventricular prominence consistent with superficial and central atrophy. No intraparenchymal hemorrhage, mass effect nor midline shift. Periventricular and subcortical white matter hypodensities consistent with chronic moderate small vessel ischemic disease are identified. Small lacunar infarcts noted of the left basal ganglia and anterior limb of internal capsule, chronic in appearance. No acute large vascular territory infarcts. No abnormal extra-axial fluid collections. Basal cisterns are not effaced and midline. VASCULAR: Moderate calcific atherosclerosis of the carotid siphons. SKULL: No skull fracture. No significant scalp soft tissue swelling. SINUSES/ORBITS: The mastoid air-cells are clear. The included paranasal sinuses are well-aerated.The included ocular globes and orbital contents are non-suspicious. OTHER: None. IMPRESSION: Stable atrophy with moderate chronic microvascular ischemic disease. No acute intracranial process. Electronically Signed   By: Ashley Royalty M.D.   On: 11/12/2016 19:53   Mr Brain Wo Contrast  Result Date: 11/13/2016 CLINICAL DATA:  Increasing confusion for 3 days. Diarrhea today. Assess acute encephalopathy. History of metastatic prostate cancer, hypertension, anti coagulation. EXAM: MRI HEAD WITHOUT CONTRAST TECHNIQUE: Multiplanar, multiecho pulse sequences of the brain and surrounding structures were obtained without intravenous contrast. COMPARISON:  CT HEAD November 12, 2017 at [place] hours and CT HEAD May 22, 2014 FINDINGS: BRAIN: No reduced diffusion to suggest acute  ischemia. No susceptibility artifact to suggest hemorrhage ; a few scattered peripheral microhemorrhages compatible chronic hypertension. The ventricles and sulci are normal for patient's age. No suspicious parenchymal signal, masses or mass effect. Old bilateral cerebellar infarcts. Bold bilateral thalamus and bilateral basal ganglia lacunar infarcts. Patchy to confluent supratentorial and pontine white matter FLAIR T2 hyperintensities. No abnormal extra-axial fluid collections. No extra-axial masses though, contrast enhanced sequences would be more sensitive. VASCULAR: Major intracranial vascular flow voids present at skull base dolichoectasia compatible chronic hypertension. SKULL AND UPPER CERVICAL SPINE: No abnormal sellar expansion. No suspicious calvarial bone marrow signal. Craniocervical junction maintained. SINUSES/ORBITS: The mastoid air-cells and included paranasal sinuses are well-aerated. Status post bilateral ocular lens implants. The included ocular globes and orbital contents are non-suspicious. OTHER: None. IMPRESSION: No acute intracranial process. Chronic changes include old bilateral basal ganglia, thalamus and cerebellar infarcts, moderate chronic small vessel ischemic disease. Electronically Signed   By: Elon Alas M.D.   On: 11/13/2016 00:24   Dg Chest Portable 1 View  Result Date: 11/12/2016 CLINICAL DATA:  Confusion, altered mental status. EXAM: PORTABLE CHEST 1 VIEW COMPARISON:  08/23/2015 FINDINGS: Postsurgical changes from CABG and valvular replacement, stable. Cardiomediastinal silhouette is normal. Mediastinal contours appear intact. Tortuosity and mild calcific atherosclerotic disease of the aorta. There is no evidence of focal airspace consolidation, pleural effusion or pneumothorax. Osseous  structures are without acute abnormality. Soft tissues are grossly normal. IMPRESSION: No active disease. Calcific atherosclerotic disease of the aorta. Electronically Signed   By:  Fidela Salisbury M.D.   On: 11/12/2016 19:29     Alhassan Everingham M.D on 11/14/2016 at 12:41 PM  Between 7am to 7pm - Pager - [phone]  After 7pm go to [email or web address] - password [facility]  [facility] -  Office  [phone]

## 2016-11-14 NOTE — NC FL2 (Signed)
[place] LEVEL OF CARE SCREENING TOOL     IDENTIFICATION  Patient Name: Clayton [name] Birthdate: [birth year] Sex: male Admission Date (Current Location): 11/12/2016  [facility] and [place] Number:  Herbalist and Address:  [facility] [place], [place] [address], [place], [place] [address]      Provider Number: [id number]  Attending Physician Name and Address:  Albertine Patricia, MD  Relative Name and Phone Number:       Current Level of Care: Hospital Recommended Level of Care: [place] Prior Approval Number:    Date Approved/Denied:   PASRR Number:  ([id number])  Discharge Plan: SNF    Current Diagnoses: Patient Active Problem List   Diagnosis Date Noted  . Dehydration 11/12/2016  . Acute encephalopathy 11/12/2016  . Diarrhea of presumed infectious origin 11/12/2016  . Hot flashes 09/28/2014  . Knee pain, chronic 09/28/2014  . Prostate cancer metastatic to bone ([place]) 05/29/2014  . Anemia 05/21/2014  . Acute on chronic diastolic heart failure ([place]) 05/13/2014  . Chronic diastolic heart failure ([place]) 05/12/2014  . Dementia without behavioral disturbance 05/09/2014  . Acute blood loss anemia 05/09/2014  . Acute on chronic renal failure ([place]) 05/07/2014  . Closed fracture of lesser trochanter of femur ([place]) 05/06/2014  . Fall at home 05/06/2014  . Femoral fracture ([place]) 05/06/2014  . B12 deficiency 10/19/2013  . Impaired vision 10/19/2013  . Chronic anticoagulation 10/19/2013  . Fall 10/18/2013  . Weakness 10/18/2013  . Preoperative clearance 08/23/2013  . Right inguinal hernia 08/15/2013  . Aortic valve disorders 12/15/2010  . Coronary atherosclerosis of native coronary artery 11/29/2009  . HYPERLIPIDEMIA 07/01/2009  . Essential hypertension 07/01/2009  . s/p [employer] AVR 07/01/2009    Orientation RESPIRATION BLADDER Height & Weight     Self  Normal Incontinent Weight: 220 lb 7.4 oz (100 kg) Height:  6'  (182.9 cm)  BEHAVIORAL SYMPTOMS/MOOD NEUROLOGICAL BOWEL NUTRITION STATUS   (none )  (none ) Incontinent Diet (REGULAR)  AMBULATORY STATUS COMMUNICATION OF NEEDS Skin   Extensive Assist Verbally Normal                       Personal Care Assistance Level of Assistance  Bathing, Feeding, Dressing Bathing Assistance: Maximum assistance Feeding assistance: Limited assistance Dressing Assistance: Maximum assistance     Functional Limitations Info  Speech, Hearing, Sight Sight Info: Adequate Hearing Info: Impaired Speech Info: Adequate    SPECIAL CARE FACTORS FREQUENCY  PT (By licensed PT), OT (By licensed OT)     PT Frequency: 3 OT Frequency: 2            Contractures      Additional Factors Info  Code Status, Allergies Code Status Info: DNR Allergies Info: Tetanus, Toxoids            Current Medications (11/14/2016):  This is the current hospital active medication list Current Facility-Administered Medications  Medication Dose Route Frequency Provider Last Rate Last Dose  . 0.9 %  sodium chloride infusion   Intravenous Continuous Lily Kocher, MD 10 mL/hr at 11/13/16 2335    . acetaminophen (TYLENOL) tablet 650 mg  650 mg Oral Q6H PRN Rise Patience, MD       Or  . acetaminophen (TYLENOL) suppository 650 mg  650 mg Rectal Q6H PRN Rise Patience, MD      . donepezil (ARICEPT) tablet 5 mg  5 mg Oral QHS Dawood S Elgergawy,  MD   5 mg at 11/13/16 2159  . hydrALAZINE (APRESOLINE) injection 10 mg  10 mg Intravenous Q4H PRN Rise Patience, MD   10 mg at 11/13/16 2202  . latanoprost (XALATAN) 0.005 % ophthalmic solution 1 drop  1 drop Both Eyes QHS Rise Patience, MD   1 drop at 11/13/16 2201  . lisinopril (PRINIVIL,ZESTRIL) tablet 10 mg  10 mg Oral Daily Albertine Patricia, MD   10 mg at 11/14/16 0917  . metoprolol (LOPRESSOR) tablet 100 mg  100 mg Oral Daily Albertine Patricia, MD   100 mg at 11/14/16 0917  . metoprolol (LOPRESSOR) tablet 50 mg  50  mg Oral QHS Silver Huguenin Elgergawy, MD      . multivitamin (PROSIGHT) tablet 1 tablet  1 tablet Oral BID Albertine Patricia, MD   1 tablet at 11/14/16 0916  . ondansetron (ZOFRAN) tablet 4 mg  4 mg Oral Q6H PRN Rise Patience, MD       Or  . ondansetron [facility]) injection 4 mg  4 mg Intravenous Q6H PRN Rise Patience, MD   4 mg at 11/13/16 2323  . pantoprazole (PROTONIX) EC tablet 40 mg  40 mg Oral Daily Albertine Patricia, MD   40 mg at 11/14/16 0916  . tamsulosin (FLOMAX) capsule 0.4 mg  0.4 mg Oral QHS Albertine Patricia, MD   0.4 mg at 11/13/16 2159  . warfarin (COUMADIN) tablet 7.5 mg  7.5 mg Oral ONCE-1800 Rolla Flatten, [facility]      . Warfarin - Pharmacist Dosing Inpatient   Does not apply [place], [facility]         Discharge Medications: Please see discharge summary for a list of discharge medications.  Relevant Imaging Results:  Relevant Lab Results:   Additional Information SSN [id number]  Rozell Searing

## 2016-11-14 NOTE — Progress Notes (Signed)
ANTICOAGULATION CONSULT NOTE - Follow Up Consult  Pharmacy Consult for Warfarin Indication: Mechanical AVR  Allergies  Allergen Reactions  . Tetanus Toxoids Hives    Patient Measurements: Height: 6' (182.9 cm) Weight: 220 lb 7.4 oz (100 kg) IBW/kg (Calculated) : 77.6 Heparin Dosing Weight: 81 kg  Vital Signs: Temp: 98.1 F (36.7 C) (01/06 0410) BP: 150/63 (01/06 0916) Pulse Rate: 88 (01/06 0916)  Labs:  Recent Labs  11/12/16 1658 11/12/16 1957 11/13/16 0100 11/13/16 1336 11/14/16 0802  HGB 12.5*  --  12.2*  --   --   HCT 39.1  --  37.8*  --   --   PLT 206  --  151  --   --   LABPROT  --  28.2* 27.5*  --  26.1*  INR  --  2.58 2.50  --  2.35  HEPARINUNFRC  --   --   --  0.14*  --   CREATININE 1.37*  --  1.18  --   --   TROPONINI  --   --  <0.03  --   --     Estimated Creatinine Clearance: 53 mL/min (by C-G formula based on SCr of 1.18 mg/dL).   Medications:   Assessment: 89 YOM on warfarin PTA for hx mech AVR held on admission and bridged with heparin while NPO. The patient is now taking orals and plans are to resume warfarin.   INR today is slightly SUBtherapeutic (INR 2.35 << 2.5, goal of 2.5-3.5). Drop in INR is likely due to missed dose on 1/4. Per discussion with the MD on 1/5 - does not feel bridging is necessary if "drops slightly below goal" due to the patient's age and risk of bleeding. Will continue with higher dose of warfarin this evening to attempt to boost into goal range. No CBC today, no overt s/sx of bleeding noted.   If MD does decide bridging is desired - could give Lovenox 100 mg x 1 to cover until the INR is drawn on 1/7 AM labs.   Goal of Therapy:  INR 2.5-3.5 Monitor platelets by anticoagulation protocol: Yes   Plan:  1. Repeat Warfarin 7.5 mg x 1 dose at 1800 today 2. Daily PT/INR 3. Will continue to monitor for any signs/symptoms of bleeding and will follow up with PT/INR in the a.m.   Thank you for allowing pharmacy to be a part of  this patient's care.  Alycia Rossetti, PharmD, BCPS Clinical Pharmacist Pager: [phone] Clinical phone for 11/14/2016 from 7a-3:30p: [phone] If after 3:30p, please call main pharmacy at: [phone] 11/14/2016 12:04 PM

## 2016-11-14 NOTE — Clinical Social Work Note (Signed)
FL-2 completed, PASARR confirmed and SNF referral refaxed to facilities.   Glendon Axe, MSW [phone] 11/14/2016 1:47 PM

## 2016-11-15 DIAGNOSIS — G9340 Encephalopathy, unspecified: Secondary | ICD-10-CM | POA: Diagnosis not present

## 2016-11-15 DIAGNOSIS — R197 Diarrhea, unspecified: Secondary | ICD-10-CM | POA: Diagnosis not present

## 2016-11-15 DIAGNOSIS — A09 Infectious gastroenteritis and colitis, unspecified: Secondary | ICD-10-CM | POA: Diagnosis not present

## 2016-11-15 DIAGNOSIS — E860 Dehydration: Secondary | ICD-10-CM | POA: Diagnosis not present

## 2016-11-15 LAB — GLUCOSE, CAPILLARY
GLUCOSE-CAPILLARY: 129 mg/dL — AB (ref 65–99)
GLUCOSE-CAPILLARY: 103 mg/dL — AB (ref 65–99)
GLUCOSE-CAPILLARY: 96 mg/dL (ref 65–99)
GLUCOSE-CAPILLARY: 111 mg/dL — AB (ref 65–99)

## 2016-11-15 LAB — PROTIME-INR
INR: 2.43
Prothrombin Time: 26.9 seconds — ABNORMAL HIGH (ref 11.4–15.2)

## 2016-11-15 MED ORDER — WARFARIN SODIUM 1 MG PO TABS: 5.0000 mg | ORAL_TABLET | Freq: Every day | ORAL | Status: AC

## 2016-11-15 MED ORDER — ACETAMINOPHEN 325 MG PO TABS: 650.0000 mg | ORAL_TABLET | Freq: Four times a day (QID) | ORAL | Status: AC | PRN

## 2016-11-15 MED ORDER — CYANOCOBALAMIN 1000 MCG/ML IJ SOLN
1000.0000 ug | INTRAMUSCULAR | 0 refills | Status: AC
Start: 2016-11-15 — End: ?

## 2016-11-15 MED ORDER — WARFARIN SODIUM 7.5 MG PO TABS: 7.5000 mg | ORAL_TABLET | Freq: Once | ORAL | Status: DC

## 2016-11-15 NOTE — Discharge Summary (Signed)
Clayton [name], is a 83 y.o. male  DOB 1926-02-21  MRN [id number].  Admission date:  11/12/2016  Admitting Physician  Clayton Patience, MD  Discharge Date:  11/15/2016   Primary MD  Clayton [name], MD  Recommendations for primary care physician for things to follow:  - Please check INR and adjust warfarin dose as needed, target INR 2.5-3.5 for mechanical valve, agent received warfarin. To discharge, first dose to start on 11/16/2016.  CODE STATUS: DO NOT RESUSCITATE Admission Diagnosis  Diarrhea of presumed infectious origin [A09]   Discharge Diagnosis  Diarrhea of presumed infectious origin [A09]    Principal Problem:   Acute encephalopathy Active Problems:   Essential hypertension   Dementia without behavioral disturbance   Chronic diastolic heart failure (HCC)   Prostate cancer metastatic to bone (HCC)   Dehydration   Diarrhea of presumed infectious origin      Past Medical History:  Diagnosis Date  . Aortic aneurysm (HCC)    at least 3 cmm infrarenal AAA by lumbar CT 05/22/13 [facility]); 41 mm proximal ascending aorta on echo 05/05/13 [facility])  . Arthritis   . CAD (coronary artery disease)    s/p CABG 1999  . GERD (gastroesophageal reflux disease)   . H/O hiatal hernia   . HLD (hyperlipidemia)   . HTN (hypertension)   . Peripheral vascular disease ([place])    aneursym    Past Surgical History:  Procedure Laterality Date  . AORTIC VALVE REPLACEMENT  1998  . CHOLECYSTECTOMY    . CORONARY ARTERY BYPASS GRAFT  1998  . HAND SURGERY Left    MULTIPLE   . INGUINAL HERNIA REPAIR  09/25/2013   with mesh  . INGUINAL HERNIA REPAIR Right 09/25/2013   Procedure: HERNIA REPAIR INGUINAL ADULT;  Surgeon: Imogene Burn. Georgette Dover, MD;  Location: [facility];  Service: General;  Laterality: Right;  . INSERTION OF MESH Right 09/25/2013   Procedure: INSERTION OF MESH;  Surgeon: Imogene Burn. Georgette Dover, MD;   Location: [place];  Service: General;  Laterality: Right;  . INTRAMEDULLARY (IM) NAIL INTERTROCHANTERIC Right 05/10/2014   Procedure: Open Reduction Internal Fixation Right Hip;  Surgeon: Mcarthur Rossetti, MD;  Location: [place];  Service: Orthopedics;  Laterality: Right;  . TONSILLECTOMY    . US EXTREMITY*L* Left    arm       History of present illness and  Hospital Course:     Kindly see H&P for history of present illness and admission details, please review complete Labs, Consult reports and Test reports for all details in brief  HPI  from the history and physical done on the day of admission 11/12/2016 HPI: Clayton [name] is a 83 y.o. male with history of metastatic prostate cancer on hormone therapy, CAD status post CABG and mechanical aortic valve replacement on Coumadin, hypertension, chronic kidney disease stage III, anemia was brought to the ER after patient was found to be increasingly confused over the last 3 days. Patient also had 3-4 episodes of diarrhea today. Patient  did not have any chest pain shortness of breath nausea vomiting diarrhea. Patient is not on any pain relief medications or sedatives. CT head was unremarkable. On my exam patient is lethargic but oriented to his name and is able to recognize his daughter's voice. Patient is blind in both eyes. Patient received 1 L normal saline bolus in the ER. Patient will be admitted for acute encephalopathy cause not clear likely could be from dehydration.   ED Course: CT head was unremarkable. UA and chest x-Arin were unremarkable. 1 L normal saline bolus was given   Hospital Course  83 y.o.malewith history of metastatic prostate cancer on hormone therapy, CAD status post CABG and mechanical aortic valve replacement on Coumadin, hypertension, chronic kidney disease stage III, anemia was brought to the ER after patient was found to be increasingly confused over the last 3 days. Patient also had 3-4 episodes of diarrhea  Acute  encephalopathy - Unclear etiology, possibly due to dehydration from diarrhea, MRI brain with no evidence of acute findings, ABG with no evidence of CO2 retention, appears to be improving, family  reports patient is back to baseline.  Diarrhea - has not used any recent antibiotics. No further diarrhea since admission, resolved  Mechanical aortic valve -On warfarin, INR 2.43 on discharge  Hypertension  - Uncontrolled [place] as was not able to take his medication secondary to encephalopathy, currently back to baseline, cleared by SLP, resume back on home meds,   CAD status post CABG - has not complained of any chest pain. Treated with aspirin  Metastatic prostate cancer - on hormone therapy per oncologist.  Chronic anemia - follow CBC.  Chronic kidney disease stage III - creatinine appears to be at baseline.    Discharge Condition:  Stable   Follow UP SNF physician     Discharge Instructions  and  Discharge Medications     Discharge Instructions    Discharge instructions    Complete by:  As directed    Follow with Primary MD Clayton [name], MD after discharge from SNF  Get CBC, CMP checked  by Primary MD next visit.    Activity: As tolerated with Full fall precautions use walker/cane & assistance as needed   Disposition SNF   Diet: Heart Healthy  , with feeding assistance and aspiration precautions.  For Heart failure patients - Check your Weight same time everyday, if you gain over 2 pounds, or you develop in leg swelling, experience more shortness of breath or chest pain, call your Primary MD immediately. Follow Cardiac Low Salt Diet and 1.5 lit/day fluid restriction.   On your next visit with your primary care physician please Get Medicines reviewed and adjusted.   Please request your Prim.MD to go over all Hospital Tests and Procedure/Radiological results at the follow up, please get all Hospital records sent to your Prim MD by signing hospital  release before you go home.   If you experience worsening of your admission symptoms, develop shortness of breath, life threatening emergency, suicidal or homicidal thoughts you must seek medical attention immediately by calling 911 or calling your MD immediately  if symptoms less severe.  You Must read complete instructions/literature along with all the possible adverse reactions/side effects for all the Medicines you take and that have been prescribed to you. Take any new Medicines after you have completely understood and accpet all the possible adverse reactions/side effects.   Do not drive, operating heavy machinery, perform activities at heights, swimming or participation in water activities or  provide baby sitting services if your were admitted for syncope or siezures until you have seen by Primary MD or a Neurologist and advised to do so again.  Do not drive when taking Pain medications.    Do not take more than prescribed Pain, Sleep and Anxiety Medications  Special Instructions: If you have smoked or chewed Tobacco  in the last 2 yrs please stop smoking, stop any regular Alcohol  and or any Recreational drug use.  Wear Seat belts while driving.   Please note  You were cared for by a hospitalist during your hospital stay. If you have any questions about your discharge medications or the care you received while you were in the hospital after you are discharged, you can call the unit and asked to speak with the hospitalist on call if the hospitalist that took care of you is not available. Once you are discharged, your primary care physician will handle any further medical issues. Please note that NO REFILLS for any discharge medications will be authorized once you are discharged, as it is imperative that you return to your primary care physician (or establish a relationship with a primary care physician if you do not have one) for your aftercare needs so that they can reassess your need  for medications and monitor your lab values.     Allergies as of 11/15/2016      Reactions   Tetanus Toxoids Hives      Medication List    STOP taking these medications   amoxicillin 500 MG tablet Commonly known as:  AMOXIL     TAKE these medications   acetaminophen 325 MG tablet Commonly known as:  TYLENOL Take 2 tablets (650 mg total) by mouth every 6 (six) hours as needed for mild pain (or Fever >/= 101).   aspirin EC 81 MG tablet Take 81 mg by mouth daily.   cyanocobalamin 1000 MCG/ML injection Commonly known as:  (VITAMIN B-12) Inject 1 mL (1,000 mcg total) into the muscle every 30 (thirty) days. 1000 mcg IM daily for 5 days (received 7/7-7/11), then weekly for 3 weeks (first dose due 7/18), then monthly What changed:  when to take this   donepezil 5 MG tablet Commonly known as:  ARICEPT Take 5 mg by mouth at bedtime.   latanoprost 0.005 % ophthalmic solution Commonly known as:  XALATAN Place 1 drop into both eyes at bedtime.   leuprolide 22.5 MG injection Commonly known as:  LUPRON Inject 22.5 mg into the muscle every 3 (three) months.   lisinopril 10 MG tablet Commonly known as:  PRINIVIL,ZESTRIL Take 1 tablet by mouth daily.   metoprolol 100 MG tablet Commonly known as:  LOPRESSOR Take 100 mg by mouth daily.   metoprolol 50 MG tablet Commonly known as:  LOPRESSOR Take 50 mg by mouth at bedtime.   multivitamin-lutein Caps capsule Take 1 capsule by mouth 2 (two) times daily.   omeprazole 20 MG capsule Commonly known as:  PRILOSEC Take 20 mg by mouth daily.   tamsulosin 0.4 MG Caps capsule Commonly known as:  FLOMAX Take 0.4 mg by mouth at bedtime.   warfarin 1 MG tablet Commonly known as:  COUMADIN Take 5-7 tablets (5-7 mg total) by mouth daily. Pt takes  5 mg every day except on Sunday Thursday and Saturday take  two of the 1 mg tablets to equal a total of 7 mg -Start first dose on 1/8/oh thousand 18, as patient received warfarin in hospital  today. To  discharge Start taking on:  11/16/2016 What changed:  additional instructions         Diet and Activity recommendation: See Discharge Instructions above   Consults obtained -  None   Major procedures and Radiology Reports - PLEASE review detailed and final reports for all details, in brief -      Ct Head Wo Contrast  Result Date: 11/12/2016 CLINICAL DATA:  Confusion, altered mental status and weakness. EXAM: CT HEAD WITHOUT CONTRAST TECHNIQUE: Contiguous axial images were obtained from the base of the skull through the vertex without intravenous contrast. COMPARISON:  CT from 05/22/2014 FINDINGS: BRAIN: There is sulcal and ventricular prominence consistent with superficial and central atrophy. No intraparenchymal hemorrhage, mass effect nor midline shift. Periventricular and subcortical white matter hypodensities consistent with chronic moderate small vessel ischemic disease are identified. Small lacunar infarcts noted of the left basal ganglia and anterior limb of internal capsule, chronic in appearance. No acute large vascular territory infarcts. No abnormal extra-axial fluid collections. Basal cisterns are not effaced and midline. VASCULAR: Moderate calcific atherosclerosis of the carotid siphons. SKULL: No skull fracture. No significant scalp soft tissue swelling. SINUSES/ORBITS: The mastoid air-cells are clear. The included paranasal sinuses are well-aerated.The included ocular globes and orbital contents are non-suspicious. OTHER: None. IMPRESSION: Stable atrophy with moderate chronic microvascular ischemic disease. No acute intracranial process. Electronically Signed   By: Ashley Royalty M.D.   On: 11/12/2016 19:53   Mr Brain Wo Contrast  Result Date: 11/13/2016 CLINICAL DATA:  Increasing confusion for 3 days. Diarrhea today. Assess acute encephalopathy. History of metastatic prostate cancer, hypertension, anti coagulation. EXAM: MRI HEAD WITHOUT CONTRAST TECHNIQUE: Multiplanar,  multiecho pulse sequences of the brain and surrounding structures were obtained without intravenous contrast. COMPARISON:  CT HEAD November 12, 2017 at [place] hours and CT HEAD May 22, 2014 FINDINGS: BRAIN: No reduced diffusion to suggest acute ischemia. No susceptibility artifact to suggest hemorrhage ; a few scattered peripheral microhemorrhages compatible chronic hypertension. The ventricles and sulci are normal for patient's age. No suspicious parenchymal signal, masses or mass effect. Old bilateral cerebellar infarcts. Bold bilateral thalamus and bilateral basal ganglia lacunar infarcts. Patchy to confluent supratentorial and pontine white matter FLAIR T2 hyperintensities. No abnormal extra-axial fluid collections. No extra-axial masses though, contrast enhanced sequences would be more sensitive. VASCULAR: Major intracranial vascular flow voids present at skull base dolichoectasia compatible chronic hypertension. SKULL AND UPPER CERVICAL SPINE: No abnormal sellar expansion. No suspicious calvarial bone marrow signal. Craniocervical junction maintained. SINUSES/ORBITS: The mastoid air-cells and included paranasal sinuses are well-aerated. Status post bilateral ocular lens implants. The included ocular globes and orbital contents are non-suspicious. OTHER: None. IMPRESSION: No acute intracranial process. Chronic changes include old bilateral basal ganglia, thalamus and cerebellar infarcts, moderate chronic small vessel ischemic disease. Electronically Signed   By: Elon Alas M.D.   On: 11/13/2016 00:24   Dg Chest Portable 1 View  Result Date: 11/12/2016 CLINICAL DATA:  Confusion, altered mental status. EXAM: PORTABLE CHEST 1 VIEW COMPARISON:  08/23/2015 FINDINGS: Postsurgical changes from CABG and valvular replacement, stable. Cardiomediastinal silhouette is normal. Mediastinal contours appear intact. Tortuosity and mild calcific atherosclerotic disease of the aorta. There is no evidence of focal airspace  consolidation, pleural effusion or pneumothorax. Osseous structures are without acute abnormality. Soft tissues are grossly normal. IMPRESSION: No active disease. Calcific atherosclerotic disease of the aorta. Electronically Signed   By: Fidela Salisbury M.D.   On: 11/12/2016 19:29    Micro Results    Recent Results (  from the past 240 hour(s))  C difficile quick scan w PCR reflex     Status: None   Collection Time: 11/14/16 12:56 PM  Result Value Ref Range Status   C Diff antigen NEGATIVE NEGATIVE Final   C Diff toxin NEGATIVE NEGATIVE Final   C Diff interpretation No C. difficile detected.  Final  Gastrointestinal Panel by PCR , Stool     Status: None   Collection Time: 11/14/16 12:56 PM  Result Value Ref Range Status   Campylobacter species NOT DETECTED NOT DETECTED Final   Plesimonas shigelloides NOT DETECTED NOT DETECTED Final   Salmonella species NOT DETECTED NOT DETECTED Final   Yersinia enterocolitica NOT DETECTED NOT DETECTED Final   Vibrio species NOT DETECTED NOT DETECTED Final   Vibrio cholerae NOT DETECTED NOT DETECTED Final   Enteroaggregative E coli (EAEC) NOT DETECTED NOT DETECTED Final   Enteropathogenic E coli (EPEC) NOT DETECTED NOT DETECTED Final   Enterotoxigenic E coli (ETEC) NOT DETECTED NOT DETECTED Final   Shiga like toxin producing E coli (STEC) NOT DETECTED NOT DETECTED Final   Shigella/Enteroinvasive E coli (EIEC) NOT DETECTED NOT DETECTED Final   Cryptosporidium NOT DETECTED NOT DETECTED Final   Cyclospora cayetanensis NOT DETECTED NOT DETECTED Final   Entamoeba histolytica NOT DETECTED NOT DETECTED Final   Giardia lamblia NOT DETECTED NOT DETECTED Final   Adenovirus F40/41 NOT DETECTED NOT DETECTED Final   Astrovirus NOT DETECTED NOT DETECTED Final   Norovirus GI/GII NOT DETECTED NOT DETECTED Final   Rotavirus A NOT DETECTED NOT DETECTED Final   Sapovirus (I, II, IV, and V) NOT DETECTED NOT DETECTED Final       Today   Subjective:   Clayton  [name] today has no headache,no chest or  abdominal pain, no further diarrhea since admission  Objective:   Blood pressure (!) 123/59, pulse 67, temperature 98.3 F (36.8 C), temperature source Oral, resp. rate 16, height 6' (1.829 m), weight 103.4 kg (227 lb 15.3 oz), SpO2 96 %.   Intake/Output Summary (Last 24 hours) at 11/15/16 1456 Last data filed at 11/15/16 0947  Gross per 24 hour  Intake           473.83 ml  Output              150 ml  Net           323.83 ml    Exam Awake Alert, Communicative Legally blind Supple Neck,No JVD,  Symmetrical Chest wall movement, Good air movement bilaterally, CTAB RRR,No Gallops,Rubs or new Murmurs, No Parasternal Heave +ve B.Sounds, Abd Soft, No tenderness, No rebound - guarding or rigidity. No Cyanosis, Clubbing or edema, No new Rash or bruise  Data Review   CBC w Diff: Lab Results  Component Value Date   WBC 8.4 11/13/2016   HGB 12.2 (L) 11/13/2016   HGB 11.6 (L) 10/08/2016   HCT 37.8 (L) 11/13/2016   HCT 36.0 (L) 10/08/2016   PLT 151 11/13/2016   PLT 192 10/08/2016   LYMPHOPCT 21 11/13/2016   LYMPHOPCT 16.2 10/08/2016   MONOPCT 10 11/13/2016   MONOPCT 8.9 10/08/2016   EOSPCT 3 11/13/2016   EOSPCT 2.4 10/08/2016   BASOPCT 0 11/13/2016   BASOPCT 0.2 10/08/2016    CMP: Lab Results  Component Value Date   NA 142 11/13/2016   NA 139 10/08/2016   K 4.0 11/13/2016   K 4.1 10/08/2016   CL 109 11/13/2016   CO2 25 11/13/2016   CO2 26 10/08/2016  BUN 21 (H) 11/13/2016   BUN 26.6 (H) 10/08/2016   CREATININE 1.18 11/13/2016   CREATININE 1.4 (H) 10/08/2016   PROT 6.5 11/13/2016   PROT 6.9 10/08/2016   ALBUMIN 3.4 (L) 11/13/2016   ALBUMIN 3.2 (L) 10/08/2016   BILITOT 0.5 11/13/2016   BILITOT 0.58 10/08/2016   ALKPHOS 164 (H) 11/13/2016   ALKPHOS 156 (H) 10/08/2016   AST 23 11/13/2016   AST 20 10/08/2016   ALT 14 (L) 11/13/2016   ALT 17 10/08/2016  .   Total Time in preparing paper work, data evaluation and todays exam  - 35 minutes  Trevon Strothers M.D on 11/15/2016 at 2:56 PM  [facility]   Office  [phone]

## 2016-11-15 NOTE — Clinical Social Work Placement (Addendum)
   CLINICAL SOCIAL WORK PLACEMENT  NOTE  Date:  11/15/2016  Patient Details  Name: Clayton [name] MRN: [id number] Date of Birth: Dec 19, 1925  Clinical Social Work is seeking post-discharge placement for this patient at the [place] level of care (*CSW will initial, date and re-position this form in  chart as items are completed):  Yes   Patient/family provided with [place] Work Department's list of facilities offering this level of care within the geographic area requested by the patient (or if unable, by the patient's family).  Yes   Patient/family informed of their freedom to choose among providers that offer the needed level of care, that participate in Medicare, Medicaid or managed care program needed by the patient, have an available bed and are willing to accept the patient.  Yes   Patient/family informed of [facility]'s ownership interest in [facility] and [facility], as well as of the fact that they are under no obligation to receive care at these facilities.  PASRR submitted to EDS on 11/14/16     PASRR number received on 11/14/16     Existing PASRR number confirmed on       FL2 transmitted to all facilities in geographic area requested by pt/family on 11/14/16     FL2 transmitted to all facilities within larger geographic area on       Patient informed that his/her managed care company has contracts with or will negotiate with certain facilities, including the following:        Yes   Patient/family informed of bed offers received.  Patient chooses bed at [facility]     Physician recommends and patient chooses bed at      Patient to be transferred to [facility] on 11/15/16.  Patient to be transferred to facility by Ambulance     Patient family notified on 11/15/16 of transfer.  Name of family member notified:  Elenore Rota     PHYSICIAN Please prepare priority discharge summary, including  medications, Please prepare prescriptions, Please sign FL2, Please sign DNR     Additional Comment:   Per MD patient ready for DC to [facility] under 5 day LOG pending [employer] [place]. RN, patient, patient's family, and facility notified of DC. RN given number for report. DC packet on chart. Ambulance transport requested for patient. CSW signing off.  _______________________________________________ Rigoberto Noel, LCSW 11/15/2016, 3:33 PM

## 2016-11-15 NOTE — Discharge Instructions (Signed)
Follow with Primary MD [name], MD after discharge from SNF  Get CBC, CMP checked  by Primary MD next visit.    Activity: As tolerated with Full fall precautions use walker/cane & assistance as needed   Disposition SNF   Diet: Heart Healthy  , with feeding assistance and aspiration precautions.  For Heart failure patients - Check your Weight same time everyday, if you gain over 2 pounds, or you develop in leg swelling, experience more shortness of breath or chest pain, call your Primary MD immediately. Follow Cardiac Low Salt Diet and 1.5 lit/day fluid restriction.   On your next visit with your primary care physician please Get Medicines reviewed and adjusted.   Please request your Prim.MD to go over all Hospital Tests and Procedure/Radiological results at the follow up, please get all Hospital records sent to your Prim MD by signing hospital release before you go home.   If you experience worsening of your admission symptoms, develop shortness of breath, life threatening emergency, suicidal or homicidal thoughts you must seek medical attention immediately by calling 911 or calling your MD immediately  if symptoms less severe.  You Must read complete instructions/literature along with all the possible adverse reactions/side effects for all the Medicines you take and that have been prescribed to you. Take any new Medicines after you have completely understood and accpet all the possible adverse reactions/side effects.   Do not drive, operating heavy machinery, perform activities at heights, swimming or participation in water activities or provide baby sitting services if your were admitted for syncope or siezures until you have seen by Primary MD or a Neurologist and advised to do so again.  Do not drive when taking Pain medications.    Do not take more than prescribed Pain, Sleep and Anxiety Medications  Special Instructions: If you have smoked or chewed Tobacco  in the last 2  yrs please stop smoking, stop any regular Alcohol  and or any Recreational drug use.  Wear Seat belts while driving.   Please note  You were cared for by a hospitalist during your hospital stay. If you have any questions about your discharge medications or the care you received while you were in the hospital after you are discharged, you can call the unit and asked to speak with the hospitalist on call if the hospitalist that took care of you is not available. Once you are discharged, your primary care physician will handle any further medical issues. Please note that NO REFILLS for any discharge medications will be authorized once you are discharged, as it is imperative that you return to your primary care physician (or establish a relationship with a primary care physician if you do not have one) for your aftercare needs so that they can reassess your need for medications and monitor your lab values.

## 2016-11-15 NOTE — Progress Notes (Deleted)
PROGRESS NOTE                                                                                                                                                                                                             Patient Demographics:    Clayton [name], is a 83 y.o. male, DOB - 06/14/1926, [id number]  Admit date - 11/12/2016   Admitting Physician Rise Patience, MD  Outpatient Primary MD for the patient is [name], MD  LOS - 2    Chief Complaint  Patient presents with  . Diarrhea       Brief Narrative   83 y.o. male with history of metastatic prostate cancer on hormone therapy, CAD status post CABG and mechanical aortic valve replacement on Coumadin, hypertension, chronic kidney disease stage III, anemia was brought to the ER after patient was found to be increasingly confused over the last 3 days. Patient also had 3-4 episodes of diarrhea  Subjective:    [name] today has, No headache, No chest pain, No abdominal pain - No Nausea, No further diarrhea  Assessment  & Plan :    Principal Problem:   Acute encephalopathy Active Problems:   Essential hypertension   Dementia without behavioral disturbance   Chronic diastolic heart failure (HCC)   Prostate cancer metastatic to bone (HCC)   Dehydration   Diarrhea of presumed infectious origin   Acute encephalopathy  - Unclear etiology, possibly due to dehydration from diarrhea, MRI brain with no evidence of acute findings, ABG with no evidence of CO2 retention, appears to be improving, family  reports patient is back to baseline.  Diarrhea  - has not used any recent antibiotics. No further diarrhea since admission, unable to send GI panel yet - On IV fluids for dehydration, has been stopped giving uncontrolled blood pressure  Mechanical aortic valve -On warfarin, pharmacy to dose  Hypertension  - Uncontrolled as was not able to take his medication secondary to encephalopathy,  currently back to baseline, cleared by SLP, resume back on home meds, continue with when necessary hydralazine .  CAD status post CABG  - has not complained of any chest pain.  Metastatic prostate cancer - on hormone therapy per oncologist.  Chronic anemia  - follow CBC.  Chronic kidney disease stage III  - creatinine appears to be at baseline.  Code Status : DNR  Family Communication  : none at bedside  Disposition Plan  : SNF when bed is available  Consults  :  None  Procedures  : None  DVT Prophylaxis  : warfarin   Lab Results  Component Value Date   PLT 151 11/13/2016    Antibiotics  :    Anti-infectives    None        Objective:   Vitals:   11/15/16 0227 11/15/16 0543 11/15/16 0827 11/15/16 1220  BP:  131/65 (!) 143/68 (!) 123/59  Pulse:  76 85 67  Resp:  18  16  Temp:  97.9 F (36.6 C)  98.3 F (36.8 C)  TempSrc:  Oral  Oral  SpO2:  97% 96% 96%  Weight: 103.4 kg (227 lb 15.3 oz)     Height:        Wt Readings from Last 3 Encounters:  11/15/16 103.4 kg (227 lb 15.3 oz)  06/25/16 90.4 kg (199 lb 3.2 oz)  10/21/15 90.3 kg (199 lb)     Intake/Output Summary (Last 24 hours) at 11/15/16 1410 Last data filed at 11/15/16 0947  Gross per 24 hour  Intake           473.83 ml  Output              150 ml  Net           323.83 ml     Physical Exam  Awake Alert, Communicative Legally blind Supple Neck,No JVD,  Symmetrical Chest wall movement, Good air movement bilaterally, CTAB RRR,No Gallops,Rubs or new Murmurs, No Parasternal Heave +ve B.Sounds, Abd Soft, No tenderness, No rebound - guarding or rigidity. No Cyanosis, Clubbing or edema, No new Rash or bruise      Data Review:    CBC  Recent Labs Lab 11/12/16 1658 11/12/16 1957 11/13/16 0100  WBC 8.6  --  8.4  HGB 12.5*  --  12.2*  HCT 39.1  --  37.8*  PLT 206  --  151  MCV 93.1  --  92.2  MCH 29.8  --  29.8  MCHC 32.0  --  32.3  RDW 13.8  --  13.8  LYMPHSABS  --  1.5 1.7    MONOABS  --  0.8 0.8  EOSABS  --  0.2 0.2  BASOSABS  --  0.0 0.0    Chemistries   Recent Labs Lab 11/12/16 1658 11/13/16 0100  NA 141 142  K 3.9 4.0  CL 107 109  CO2 26 25  GLUCOSE 124* 101*  BUN 22* 21*  CREATININE 1.37* 1.18  CALCIUM 8.2* 8.0*  AST 24 23  ALT 15* 14*  ALKPHOS 172* 164*  BILITOT 0.4 0.5   ------------------------------------------------------------------------------------------------------------------ No results for input(s): CHOL, HDL, LDLCALC, TRIG, CHOLHDL, LDLDIRECT in the last 72 hours.  No results found for: HGBA1C ------------------------------------------------------------------------------------------------------------------  Recent Labs  11/13/16 0100  TSH 1.331   ------------------------------------------------------------------------------------------------------------------ No results for input(s): VITAMINB12, FOLATE, FERRITIN, TIBC, IRON, RETICCTPCT in the last 72 hours.  Coagulation profile  Recent Labs Lab 11/12/16 1957 11/13/16 0100 11/14/16 0802 11/15/16 0500  INR 2.58 2.50 2.35 2.43    No results for input(s): DDIMER in the last 72 hours.  Cardiac Enzymes  Recent Labs Lab 11/13/16 0100  TROPONINI <0.03   ------------------------------------------------------------------------------------------------------------------ No results found for: BNP  Inpatient Medications  Scheduled Meds: . donepezil  5 mg Oral QHS  . latanoprost  1 drop Both Eyes QHS  .  lisinopril  10 mg Oral Daily  . metoprolol  100 mg Oral Daily  . metoprolol  50 mg Oral QHS  . multivitamin  1 tablet Oral BID  . pantoprazole  40 mg Oral Daily  . tamsulosin  0.4 mg Oral QHS  . warfarin  7.5 mg Oral ONCE-1800  . Warfarin - Pharmacist Dosing Inpatient   Does not apply q1800   Continuous Infusions: . sodium chloride 10 mL/hr at 11/13/16 2335   PRN Meds:.acetaminophen **OR** acetaminophen, hydrALAZINE, ondansetron **OR** ondansetron (ZOFRAN)  IV  Micro Results Recent Results (from the past 240 hour(s))  C difficile quick scan w PCR reflex     Status: None   Collection Time: 11/14/16 12:56 PM  Result Value Ref Range Status   C Diff antigen NEGATIVE NEGATIVE Final   C Diff toxin NEGATIVE NEGATIVE Final   C Diff interpretation No C. difficile detected.  Final  Gastrointestinal Panel by PCR , Stool     Status: None   Collection Time: 11/14/16 12:56 PM  Result Value Ref Range Status   Campylobacter species NOT DETECTED NOT DETECTED Final   Plesimonas shigelloides NOT DETECTED NOT DETECTED Final   Salmonella species NOT DETECTED NOT DETECTED Final   Yersinia enterocolitica NOT DETECTED NOT DETECTED Final   Vibrio species NOT DETECTED NOT DETECTED Final   Vibrio cholerae NOT DETECTED NOT DETECTED Final   Enteroaggregative E coli (EAEC) NOT DETECTED NOT DETECTED Final   Enteropathogenic E coli (EPEC) NOT DETECTED NOT DETECTED Final   Enterotoxigenic E coli (ETEC) NOT DETECTED NOT DETECTED Final   Shiga like toxin producing E coli (STEC) NOT DETECTED NOT DETECTED Final   Shigella/Enteroinvasive E coli (EIEC) NOT DETECTED NOT DETECTED Final   Cryptosporidium NOT DETECTED NOT DETECTED Final   Cyclospora cayetanensis NOT DETECTED NOT DETECTED Final   Entamoeba histolytica NOT DETECTED NOT DETECTED Final   Giardia lamblia NOT DETECTED NOT DETECTED Final   Adenovirus F40/41 NOT DETECTED NOT DETECTED Final   Astrovirus NOT DETECTED NOT DETECTED Final   Norovirus GI/GII NOT DETECTED NOT DETECTED Final   Rotavirus A NOT DETECTED NOT DETECTED Final   Sapovirus (I, II, IV, and V) NOT DETECTED NOT DETECTED Final    Radiology Reports Ct Head Wo Contrast  Result Date: 11/12/2016 CLINICAL DATA:  Confusion, altered mental status and weakness. EXAM: CT HEAD WITHOUT CONTRAST TECHNIQUE: Contiguous axial images were obtained from the base of the skull through the vertex without intravenous contrast. COMPARISON:  CT from 05/22/2014 FINDINGS:  BRAIN: There is sulcal and ventricular prominence consistent with superficial and central atrophy. No intraparenchymal hemorrhage, mass effect nor midline shift. Periventricular and subcortical white matter hypodensities consistent with chronic moderate small vessel ischemic disease are identified. Small lacunar infarcts noted of the left basal ganglia and anterior limb of internal capsule, chronic in appearance. No acute large vascular territory infarcts. No abnormal extra-axial fluid collections. Basal cisterns are not effaced and midline. VASCULAR: Moderate calcific atherosclerosis of the carotid siphons. SKULL: No skull fracture. No significant scalp soft tissue swelling. SINUSES/ORBITS: The mastoid air-cells are clear. The included paranasal sinuses are well-aerated.The included ocular globes and orbital contents are non-suspicious. OTHER: None. IMPRESSION: Stable atrophy with moderate chronic microvascular ischemic disease. No acute intracranial process. Electronically Signed   By: Ashley Royalty M.D.   On: 11/12/2016 19:53   Mr Brain Wo Contrast  Result Date: 11/13/2016 CLINICAL DATA:  Increasing confusion for 3 days. Diarrhea today. Assess acute encephalopathy. History of metastatic prostate cancer, hypertension, anti coagulation.  EXAM: MRI HEAD WITHOUT CONTRAST TECHNIQUE: Multiplanar, multiecho pulse sequences of the brain and surrounding structures were obtained without intravenous contrast. COMPARISON:  CT HEAD November 12, 2017 at [place] hours and CT HEAD May 22, 2014 FINDINGS: BRAIN: No reduced diffusion to suggest acute ischemia. No susceptibility artifact to suggest hemorrhage ; a few scattered peripheral microhemorrhages compatible chronic hypertension. The ventricles and sulci are normal for patient's age. No suspicious parenchymal signal, masses or mass effect. Old bilateral cerebellar infarcts. Bold bilateral thalamus and bilateral basal ganglia lacunar infarcts. Patchy to confluent supratentorial  and pontine white matter FLAIR T2 hyperintensities. No abnormal extra-axial fluid collections. No extra-axial masses though, contrast enhanced sequences would be more sensitive. VASCULAR: Major intracranial vascular flow voids present at skull base dolichoectasia compatible chronic hypertension. SKULL AND UPPER CERVICAL SPINE: No abnormal sellar expansion. No suspicious calvarial bone marrow signal. Craniocervical junction maintained. SINUSES/ORBITS: The mastoid air-cells and included paranasal sinuses are well-aerated. Status post bilateral ocular lens implants. The included ocular globes and orbital contents are non-suspicious. OTHER: None. IMPRESSION: No acute intracranial process. Chronic changes include old bilateral basal ganglia, thalamus and cerebellar infarcts, moderate chronic small vessel ischemic disease. Electronically Signed   By: Elon Alas M.D.   On: 11/13/2016 00:24   Dg Chest Portable 1 View  Result Date: 11/12/2016 CLINICAL DATA:  Confusion, altered mental status. EXAM: PORTABLE CHEST 1 VIEW COMPARISON:  08/23/2015 FINDINGS: Postsurgical changes from CABG and valvular replacement, stable. Cardiomediastinal silhouette is normal. Mediastinal contours appear intact. Tortuosity and mild calcific atherosclerotic disease of the aorta. There is no evidence of focal airspace consolidation, pleural effusion or pneumothorax. Osseous structures are without acute abnormality. Soft tissues are grossly normal. IMPRESSION: No active disease. Calcific atherosclerotic disease of the aorta. Electronically Signed   By: Fidela Salisbury M.D.   On: 11/12/2016 19:29     Dore Oquin M.D on 11/15/2016 at 2:10 PM  Between 7am to 7pm - Pager - [phone]  After 7pm go to [email or web address] - password [facility]  [facility] -  Office  [phone]

## 2016-11-15 NOTE — Progress Notes (Signed)
Report called to dawn CCMD notified IV removed.

## 2016-11-15 NOTE — Progress Notes (Signed)
ANTICOAGULATION CONSULT NOTE - Follow Up Consult  Pharmacy Consult for Warfarin Indication: Mechanical AVR  Allergies  Allergen Reactions  . Tetanus Toxoids Hives    Patient Measurements: Height: 6' (182.9 cm) Weight: 227 lb 15.3 oz (103.4 kg) IBW/kg (Calculated) : 77.6 Heparin Dosing Weight: 81 kg  Vital Signs: Temp: 97.9 F (36.6 C) (01/07 0543) Temp Source: Oral (01/07 0543) BP: 143/68 (01/07 0827) Pulse Rate: 85 (01/07 0827)  Labs:  Recent Labs  11/12/16 1658  11/13/16 0100 11/13/16 1336 11/14/16 0802 11/15/16 0500  HGB 12.5*  --  12.2*  --   --   --   HCT 39.1  --  37.8*  --   --   --   PLT 206  --  151  --   --   --   LABPROT  --   < > 27.5*  --  26.1* 26.9*  INR  --   < > 2.50  --  2.35 2.43  HEPARINUNFRC  --   --   --  0.14*  --   --   CREATININE 1.37*  --  1.18  --   --   --   TROPONINI  --   --  <0.03  --   --   --   < > = values in this interval not displayed.  Estimated Creatinine Clearance: 53.8 mL/min (by C-G formula based on SCr of 1.18 mg/dL).   Medications:   Assessment: 89 YOM on warfarin PTA for hx mech AVR held on admission and bridged with heparin while NPO. The patient is now taking orals and plans are to resume warfarin.   INR today remains slightly SUBtherapeutic (INR 2.43 << 2.35, goal of 2.5-3.5). Drop in INR is likely due to missed dose on 1/4. Re-dicussed bridging with MD and again does not feel bridging is necessary since so close to goal and given the patient's age and risk of bleeding. Will continue with higher dose of warfarin this evening - would expect INR to be back to range on 1/8. No CBC today, no overt s/sx of bleeding noted. Will plan to recheck CBC in the AM  Goal of Therapy:  INR 2.5-3.5 Monitor platelets by anticoagulation protocol: Yes   Plan:  1. Repeat Warfarin 7.5 mg x 1 dose at 1800 today 2. Daily PT/INR, CBC in the AM 3. Will continue to monitor for any signs/symptoms of bleeding and will follow up with PT/INR  in the a.m.   Thank you for allowing pharmacy to be a part of this patient's care.  Alycia Rossetti, PharmD, BCPS Clinical Pharmacist Pager: [phone] Clinical phone for 11/15/2016 from 7a-3:30p: [phone] If after 3:30p, please call main pharmacy at: [phone] 11/15/2016 10:49 AM

## 2016-12-14 ENCOUNTER — Telehealth: Payer: Self-pay | Admitting: Oncology

## 2016-12-14 NOTE — Telephone Encounter (Signed)
LaRay Romo called and wanted to know if she could move Mr Fenoglio's appointment up to 1PM on 3/1

## 2017-01-07 ENCOUNTER — Ambulatory Visit (HOSPITAL_BASED_OUTPATIENT_CLINIC_OR_DEPARTMENT_OTHER): Payer: Medicare HMO | Admitting: Oncology

## 2017-01-07 ENCOUNTER — Ambulatory Visit (HOSPITAL_BASED_OUTPATIENT_CLINIC_OR_DEPARTMENT_OTHER): Payer: Medicare HMO

## 2017-01-07 ENCOUNTER — Other Ambulatory Visit (HOSPITAL_BASED_OUTPATIENT_CLINIC_OR_DEPARTMENT_OTHER): Payer: Medicare HMO

## 2017-01-07 ENCOUNTER — Telehealth: Payer: Self-pay | Admitting: Oncology

## 2017-01-07 VITALS — BP 160/74 | HR 67 | Temp 98.0°F | Resp 17 | Ht 72.0 in

## 2017-01-07 DIAGNOSIS — C7951 Secondary malignant neoplasm of bone: Secondary | ICD-10-CM

## 2017-01-07 DIAGNOSIS — Z5111 Encounter for antineoplastic chemotherapy: Secondary | ICD-10-CM

## 2017-01-07 DIAGNOSIS — C61 Malignant neoplasm of prostate: Secondary | ICD-10-CM | POA: Diagnosis not present

## 2017-01-07 DIAGNOSIS — D649 Anemia, unspecified: Secondary | ICD-10-CM | POA: Diagnosis not present

## 2017-01-07 LAB — COMPREHENSIVE METABOLIC PANEL
ALBUMIN: 3.3 g/dL — AB (ref 3.5–5.0)
ALT: 9 U/L (ref 0–55)
AST: 16 U/L (ref 5–34)
Alkaline Phosphatase: 266 U/L — ABNORMAL HIGH (ref 40–150)
Anion Gap: 7 mEq/L (ref 3–11)
BILIRUBIN TOTAL: 0.55 mg/dL (ref 0.20–1.20)
BUN: 15.3 mg/dL (ref 7.0–26.0)
CO2: 28 mEq/L (ref 22–29)
CREATININE: 1.2 mg/dL (ref 0.7–1.3)
Calcium: 8.8 mg/dL (ref 8.4–10.4)
Chloride: 106 mEq/L (ref 98–109)
EGFR: 53 mL/min/{1.73_m2} — ABNORMAL LOW (ref >90)
GLUCOSE: 136 mg/dL (ref 70–140)
Potassium: 4.3 mEq/L (ref 3.5–5.1)
SODIUM: 140 meq/L (ref 136–145)
TOTAL PROTEIN: 6.7 g/dL (ref 6.4–8.3)

## 2017-01-07 LAB — CBC WITH DIFFERENTIAL/PLATELET
BASO%: 0.7 % (ref 0.0–2.0)
Basophils Absolute: 0.1 10*3/uL (ref 0.0–0.1)
EOS%: 2.3 % (ref 0.0–7.0)
Eosinophils Absolute: 0.2 10*3/uL (ref 0.0–0.5)
HCT: 36.8 % — ABNORMAL LOW (ref 38.4–49.9)
HEMOGLOBIN: 11.8 g/dL — AB (ref 13.0–17.1)
LYMPH#: 1.1 10*3/uL (ref 0.9–3.3)
LYMPH%: 11.4 % — ABNORMAL LOW (ref 14.0–49.0)
MCH: 29 pg (ref 27.2–33.4)
MCHC: 32.2 g/dL (ref 32.0–36.0)
MCV: 90.1 fL (ref 79.3–98.0)
MONO#: 0.6 10*3/uL (ref 0.1–0.9)
MONO%: 6.9 % (ref 0.0–14.0)
NEUT%: 78.7 % — ABNORMAL HIGH (ref 39.0–75.0)
NEUTROS ABS: 7.2 10*3/uL — AB (ref 1.5–6.5)
Platelets: 207 10*3/uL (ref 140–400)
RBC: 4.08 10*6/uL — ABNORMAL LOW (ref 4.20–5.82)
RDW: 14.9 % — AB (ref 11.0–14.6)
WBC: 9.2 10*3/uL (ref 4.0–10.3)

## 2017-01-07 MED ORDER — DENOSUMAB 120 MG/1.7ML ~~LOC~~ SOLN
120.0000 mg | Freq: Once | SUBCUTANEOUS | Status: AC
  Administered 2017-01-07: 120 mg via SUBCUTANEOUS
  Filled 2017-01-07: qty 1.7

## 2017-01-07 MED ORDER — LEUPROLIDE ACETATE (4 MONTH) 30 MG IM KIT
22.5000 mg | PACK | Freq: Once | INTRAMUSCULAR | Status: DC
Start: 2017-01-07 — End: 2017-01-07

## 2017-01-07 MED ORDER — LEUPROLIDE ACETATE (3 MONTH) 22.5 MG IM KIT
22.5000 mg | PACK | Freq: Once | INTRAMUSCULAR | Status: AC
  Administered 2017-01-07: 22.5 mg via INTRAMUSCULAR
  Filled 2017-01-07: qty 22.5

## 2017-01-07 NOTE — Telephone Encounter (Signed)
Gave patient AVS and calendar per 01/07/2017 los

## 2017-01-07 NOTE — Progress Notes (Signed)
[place] ONCOLOGY OFFICE PROGRESS NOTE 01/07/17   Clayton [name], MD [place] [id number]  DIAGNOSIS: 83 year old man with hormone sensitive metastatic prostate cancer diagnosed in June 2015. He presented with bony metastasis and a PSA of 70.23.   CURRENT TREATMENT:   Lupron 22.5 mg q 3 months started on 06/05/2014. This will be given every 3 months. Completed one month of casodex 50 mg daily on 06/29/2014.  Xgeva given in September 2015. This will be resumed on May 2017.  INTERVAL HISTORY: [name] presents today for a follow-up visit with his family.  Since the last visit, he was hospitalized in January 2018 for altered mental status along other complaints. These issues have resolved in the last 2 months and after brief stay at a skilled nursing facility is back at home. He reports no recent complaints. His quality of life remains very limited and confined to a chair. He is blind and extremely hard of hearing. His appetite remained reasonable without any major changes. He reports no complications related to his previous injections.   He does not report any headaches or blurry vision or syncope. He does not report any fevers, chills, sweats or weight loss. He does not report any chest pain, palpitation orthopnea or dyspnea. He does not report any cough or hemoptysis or wheezing. Does not report any nausea, vomiting, abdominal pain. He does not report any constipation or diarrhea. Does not report any hematuria but does report nocturia and frequency. Rest of his review of systems unremarkable.  MEDICAL HISTORY: Past Medical History:  Diagnosis Date  . Aortic aneurysm (HCC)    at least 3 cmm infrarenal AAA by lumbar CT 05/22/13 [facility]); 41 mm proximal ascending aorta on echo 05/05/13 [facility])  . Arthritis   . CAD (coronary artery disease)    s/p CABG 1999  . GERD (gastroesophageal reflux disease)   . H/O hiatal hernia   . HLD  (hyperlipidemia)   . HTN (hypertension)   . Peripheral vascular disease ([place])    aneursym     ALLERGIES:  is allergic to tetanus toxoids.  MEDICATIONS:  Current Outpatient Prescriptions  Medication Sig Dispense Refill  . acetaminophen (TYLENOL) 325 MG tablet Take 2 tablets (650 mg total) by mouth every 6 (six) hours as needed for mild pain (or Fever >/= 101).    Marland Kitchen aspirin EC 81 MG tablet Take 81 mg by mouth daily.    . cyanocobalamin (,VITAMIN B-12,) 1000 MCG/ML injection Inject 1 mL (1,000 mcg total) into the muscle every 30 (thirty) days. 1000 mcg IM daily for 5 days (received 7/7-7/11), then weekly for 3 weeks (first dose due 7/18), then monthly 1 mL 0  . donepezil (ARICEPT) 5 MG tablet Take 5 mg by mouth at bedtime.     Marland Kitchen latanoprost (XALATAN) 0.005 % ophthalmic solution Place 1 drop into both eyes at bedtime.     Marland Kitchen leuprolide (LUPRON) 22.5 MG injection Inject 22.5 mg into the muscle every 3 (three) months.    Marland Kitchen lisinopril (PRINIVIL,ZESTRIL) 10 MG tablet Take 1 tablet by mouth daily.    . metoprolol (LOPRESSOR) 100 MG tablet Take 100 mg by mouth daily.     . metoprolol (LOPRESSOR) 50 MG tablet Take 50 mg by mouth at bedtime.    . multivitamin-lutein (OCUVITE-LUTEIN) CAPS Take 1 capsule by mouth 2 (two) times daily.      Marland Kitchen omeprazole (PRILOSEC) 20 MG capsule Take 20 mg by mouth daily.     Marland Kitchen  Tamsulosin HCl (FLOMAX) 0.4 MG CAPS Take 0.4 mg by mouth at bedtime.     Marland Kitchen warfarin (COUMADIN) 1 MG tablet Take 5-7 tablets (5-7 mg total) by mouth daily. Pt takes  5 mg every day except on Sunday Thursday and Saturday take  two of the 1 mg tablets to equal a total of 7 mg -Start first dose on 1/8/oh thousand 18, as patient received warfarin in hospital today. To discharge     No current facility-administered medications for this visit.     ECOG PERFORMANCE STATUS:  3  Blood pressure (!) 160/74, pulse 67, temperature 98 F (36.7 C), temperature source Oral, resp. rate 17, height 6' (1.829 m),  SpO2 95 %.  GENERAL: Alert, awake appeared without distress. He is sitting in a wheelchair. SKIN: No rashes or lesions. EYES: Sclerae are anicteric. OROPHARYNX:. No oral ulcers or lesions. NECK: supple, thyroid normal size.  LYMPH: no palpable lymphadenopathy in the cervical, axillary or inguinal  LUNGS: clear to auscultation and percussion with normal breathing effort  HEART: regular rate & rhythm and no murmurs and no lower extremity edema  ABDOMEN:abdomen soft, non-tender and normal bowel sounds. No rebound or guarding. Musculoskeletal:no cyanosis of digits and no clubbing  NEURO: No focal deficits noted.  Labs:  Lab Results  Component Value Date   WBC 9.2 01/07/2017   HGB 11.8 (L) 01/07/2017   HCT 36.8 (L) 01/07/2017   MCV 90.1 01/07/2017   PLT 207 01/07/2017   NEUTROABS 7.2 (H) 01/07/2017      Chemistry      Component Value Date/Time   NA 142 11/13/2016 0100   NA 139 10/08/2016 1448   K 4.0 11/13/2016 0100   K 4.1 10/08/2016 1448   CL 109 11/13/2016 0100   CO2 25 11/13/2016 0100   CO2 26 10/08/2016 1448   BUN 21 (H) 11/13/2016 0100   BUN 26.6 (H) 10/08/2016 1448   CREATININE 1.18 11/13/2016 0100   CREATININE 1.4 (H) 10/08/2016 1448      Component Value Date/Time   CALCIUM 8.0 (L) 11/13/2016 0100   CALCIUM 8.9 10/08/2016 1448   ALKPHOS 164 (H) 11/13/2016 0100   ALKPHOS 156 (H) 10/08/2016 1448   AST 23 11/13/2016 0100   AST 20 10/08/2016 1448   ALT 14 (L) 11/13/2016 0100   ALT 17 10/08/2016 1448   BILITOT 0.5 11/13/2016 0100   BILITOT 0.58 10/08/2016 1448     Results for HOSAM, CABRERO (MRN [id number]) as of 01/07/2017 15:16  Ref. Range 10/08/2016 14:48  PSA Latest Ref Range: 0.0 - 4.0 ng/mL 32.3 (H)      ASSESSMENT AND PLAN : Clayton [name] 83 y.o. male with:    1. Metastatic Prostate cancer. His CT scan in February 2016 showed metastatic disease to the bone but no visceral metastasis. 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  He is currently on Lupron and have tolerated it well. His PSA initially declined to 1.14 Has increased up to 32 in November 2017.  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 Risks and benefits of continuing Lupron and Xgeva were reviewed today and he is agreeable to continue for the time being. He develops further decline in his quality of life, I would recommend hospice enrollment and discontinuation of any further therapy.  2. Bone directed therapy:. Complications such as hypokalemia and osteonecrosis associated of the jaw associated with  Delton See were reviewed again and he is agreeable to continue.  3. AVR s/p mechanical valve replacement: Anticoagulation resumed with Coumadin. No bleeding complications noted.  4. Anemia: His hemoglobin remains stable at this time.   5. Follow-up: Will be in 3 months for Lupron and Xgeva injections.   [facility], MD 01/07/17

## 2017-01-08 LAB — PSA: PROSTATE SPECIFIC AG, SERUM: 69.1 ng/mL — AB (ref 0.0–4.0)

## 2017-05-11 ENCOUNTER — Telehealth: Payer: Self-pay | Admitting: Oncology

## 2017-05-11 ENCOUNTER — Ambulatory Visit (HOSPITAL_BASED_OUTPATIENT_CLINIC_OR_DEPARTMENT_OTHER): Payer: Medicare HMO | Admitting: Oncology

## 2017-05-11 ENCOUNTER — Other Ambulatory Visit (HOSPITAL_BASED_OUTPATIENT_CLINIC_OR_DEPARTMENT_OTHER): Payer: Medicare HMO

## 2017-05-11 ENCOUNTER — Ambulatory Visit (HOSPITAL_BASED_OUTPATIENT_CLINIC_OR_DEPARTMENT_OTHER): Payer: Medicare HMO

## 2017-05-11 VITALS — BP 138/52 | HR 60 | Temp 98.5°F | Resp 17 | Ht 72.0 in

## 2017-05-11 DIAGNOSIS — C7951 Secondary malignant neoplasm of bone: Secondary | ICD-10-CM | POA: Diagnosis not present

## 2017-05-11 DIAGNOSIS — D649 Anemia, unspecified: Secondary | ICD-10-CM | POA: Diagnosis not present

## 2017-05-11 DIAGNOSIS — C61 Malignant neoplasm of prostate: Secondary | ICD-10-CM

## 2017-05-11 LAB — COMPREHENSIVE METABOLIC PANEL
ALBUMIN: 3.4 g/dL — AB (ref 3.5–5.0)
ALT: 9 U/L (ref 0–55)
AST: 22 U/L (ref 5–34)
Alkaline Phosphatase: 1763 U/L — ABNORMAL HIGH (ref 40–150)
Anion Gap: 8 mEq/L (ref 3–11)
BILIRUBIN TOTAL: 0.73 mg/dL (ref 0.20–1.20)
BUN: 23 mg/dL (ref 7.0–26.0)
CALCIUM: 8.9 mg/dL (ref 8.4–10.4)
CHLORIDE: 104 meq/L (ref 98–109)
CO2: 28 mEq/L (ref 22–29)
CREATININE: 1.3 mg/dL (ref 0.7–1.3)
EGFR: 51 mL/min/{1.73_m2} — ABNORMAL LOW (ref >90)
Glucose: 109 mg/dl (ref 70–140)
Potassium: 4.8 mEq/L (ref 3.5–5.1)
Sodium: 140 mEq/L (ref 136–145)
TOTAL PROTEIN: 7 g/dL (ref 6.4–8.3)

## 2017-05-11 LAB — CBC WITH DIFFERENTIAL/PLATELET
BASO%: 0.5 % (ref 0.0–2.0)
BASOS ABS: 0 10*3/uL (ref 0.0–0.1)
EOS ABS: 0.2 10*3/uL (ref 0.0–0.5)
EOS%: 2 % (ref 0.0–7.0)
HCT: 35.8 % — ABNORMAL LOW (ref 38.4–49.9)
HGB: 11.8 g/dL — ABNORMAL LOW (ref 13.0–17.1)
LYMPH%: 14.5 % (ref 14.0–49.0)
MCH: 29.4 pg (ref 27.2–33.4)
MCHC: 33 g/dL (ref 32.0–36.0)
MCV: 89.2 fL (ref 79.3–98.0)
MONO#: 0.8 10*3/uL (ref 0.1–0.9)
MONO%: 9 % (ref 0.0–14.0)
NEUT#: 6.3 10*3/uL (ref 1.5–6.5)
NEUT%: 74 % (ref 39.0–75.0)
Platelets: 185 10*3/uL (ref 140–400)
RBC: 4.02 10*6/uL — AB (ref 4.20–5.82)
RDW: 15.4 % — AB (ref 11.0–14.6)
WBC: 8.6 10*3/uL (ref 4.0–10.3)
lymph#: 1.2 10*3/uL (ref 0.9–3.3)

## 2017-05-11 MED ORDER — DENOSUMAB 120 MG/1.7ML ~~LOC~~ SOLN
120.0000 mg | Freq: Once | SUBCUTANEOUS | Status: AC
  Administered 2017-05-11: 120 mg via SUBCUTANEOUS
  Filled 2017-05-11: qty 1.7

## 2017-05-11 MED ORDER — LEUPROLIDE ACETATE (4 MONTH) 30 MG IM KIT
22.5000 mg | PACK | Freq: Once | INTRAMUSCULAR | Status: DC
  Filled 2017-05-11: qty 30

## 2017-05-11 NOTE — Progress Notes (Signed)
[facility] OFFICE PROGRESS NOTE 05/11/17   Raina Mina., MD [place] [employer] [id number]  DIAGNOSIS: 83 year old man with hormone sensitive metastatic prostate cancer diagnosed in June 2015. He presented with bony metastasis and a PSA of 70.23.   CURRENT TREATMENT:   Lupron 22.5 mg q 3 months started on 06/05/2014. This will be given every 3 months. Completed one month of casodex 50 mg daily on 06/29/2014.  Xgeva given in September 2015. This will be resumed on May 2017.  INTERVAL HISTORY: Mr. Katzenstein presents today for a follow-up visit with his family.  Since the last visit, he reports no changes in his health. He continues to be limited and his quality of life and performance status but not change from previous baseline. His appetite remained reasonable without any major changes. He reports no complications related to his previous injections. He does report lower back discomfort after being in a sitting position for an extended period of time. Although is unclear whether he takes any pain medication for it. He does have Tylenol which he rarely uses.   He does not report any headaches or blurry vision or syncope. He does not report any fevers, chills, sweats or weight loss. He does not report any chest pain, palpitation orthopnea or dyspnea. He does not report any cough or hemoptysis or wheezing. Does not report any nausea, vomiting, abdominal pain. He does not report any constipation or diarrhea. Does not report any hematuria but does report nocturia and frequency. Rest of his review of systems unremarkable.  MEDICAL HISTORY: Past Medical History:  Diagnosis Date  . Aortic aneurysm (HCC)    at least 3 cmm infrarenal AAA by lumbar CT 05/22/13 [facility]); 41 mm proximal ascending aorta on echo 05/05/13 [facility])  . Arthritis   . CAD (coronary artery disease)    s/p CABG 1999  . GERD (gastroesophageal reflux disease)   . H/O hiatal hernia    . HLD (hyperlipidemia)   . HTN (hypertension)   . Peripheral vascular disease ([place])    aneursym     ALLERGIES:  is allergic to tetanus toxoids.  MEDICATIONS:  Current Outpatient Prescriptions  Medication Sig Dispense Refill  . acetaminophen (TYLENOL) 325 MG tablet Take 2 tablets (650 mg total) by mouth every 6 (six) hours as needed for mild pain (or Fever >/= 101).    Marland Kitchen aspirin EC 81 MG tablet Take 81 mg by mouth daily.    . cyanocobalamin (,VITAMIN B-12,) 1000 MCG/ML injection Inject 1 mL (1,000 mcg total) into the muscle every 30 (thirty) days. 1000 mcg IM daily for 5 days (received 7/7-7/11), then weekly for 3 weeks (first dose due 7/18), then monthly 1 mL 0  . donepezil (ARICEPT) 5 MG tablet Take 5 mg by mouth at bedtime.     Marland Kitchen latanoprost (XALATAN) 0.005 % ophthalmic solution Place 1 drop into both eyes at bedtime.     Marland Kitchen leuprolide (LUPRON) 22.5 MG injection Inject 22.5 mg into the muscle every 3 (three) months.    Marland Kitchen lisinopril (PRINIVIL,ZESTRIL) 10 MG tablet Take 1 tablet by mouth daily.    . metoprolol (LOPRESSOR) 100 MG tablet Take 100 mg by mouth daily.     . metoprolol (LOPRESSOR) 50 MG tablet Take 50 mg by mouth at bedtime.    . multivitamin-lutein (OCUVITE-LUTEIN) CAPS Take 1 capsule by mouth 2 (two) times daily.      Marland Kitchen omeprazole (PRILOSEC) 20 MG capsule Take 20 mg by  mouth daily.     . Tamsulosin HCl (FLOMAX) 0.4 MG CAPS Take 0.4 mg by mouth at bedtime.     Marland Kitchen warfarin (COUMADIN) 1 MG tablet Take 5-7 tablets (5-7 mg total) by mouth daily. Pt takes  5 mg every day except on Sunday Thursday and Saturday take  two of the 1 mg tablets to equal a total of 7 mg -Start first dose on 1/8/oh thousand 18, as patient received warfarin in hospital today. To discharge     No current facility-administered medications for this visit.     ECOG PERFORMANCE STATUS:  3  Blood pressure (!) 138/52, pulse 60, temperature 98.5 F (36.9 C), temperature source Oral, resp. rate 17, height 6'  (1.829 m), SpO2 95 %.  GENERAL: Comfortable-appearing gentleman without distress. SKIN: No rashes or lesions. EYES: Sclerae are anicteric. OROPHARYNX:. No oral thrush or ulcers. NECK: supple, thyroid normal size.  LYMPH: no palpable lymphadenopathy in the cervical, axillary or inguinal  LUNGS: clear to auscultation and percussion with normal breathing effort  HEART: regular rate & rhythm and no murmurs and no lower extremity edema  ABDOMEN:abdomen soft, non-tender and normal bowel sounds. No rebound or guarding. Musculoskeletal:no cyanosis of digits and no clubbing  NEURO: No focal deficits noted.  Labs:  Lab Results  Component Value Date   WBC 8.6 05/11/2017   HGB 11.8 (L) 05/11/2017   HCT 35.8 (L) 05/11/2017   MCV 89.2 05/11/2017   PLT 185 05/11/2017   NEUTROABS 6.3 05/11/2017      Chemistry      Component Value Date/Time   NA 140 01/07/2017 1455   K 4.3 01/07/2017 1455   CL 109 11/13/2016 0100   CO2 28 01/07/2017 1455   BUN 15.3 01/07/2017 1455   CREATININE 1.2 01/07/2017 1455      Component Value Date/Time   CALCIUM 8.8 01/07/2017 1455   ALKPHOS 266 (H) 01/07/2017 1455   AST 16 01/07/2017 1455   ALT 9 01/07/2017 1455   BILITOT 0.55 01/07/2017 1455       Results for JAQUAVIUS, HUDLER (MRN [id number]) as of 05/11/2017 14:35  Ref. Range 10/08/2016 14:48 01/07/2017 14:55  Prostate Specific Ag, Serum Latest Ref Range: 0.0 - 4.0 ng/mL 32.3 (H) 69.1 (H)     ASSESSMENT AND PLAN : Rachard T Trickel 83 y.o. male with:    1. Metastatic Prostate cancer. His CT scan in February 2016 showed metastatic disease to the bone but no visceral metastasis. 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  He is currently on Lupron and have tolerated it well. His PSA initially declined to 1.14 Has increased up to 69 in March 2018.  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  Risks and benefits  of adding additional therapy was discussed today with the patient and his family. His performance status is very poor and has very limited quality of life. He has no symptoms related to his cancer and we will defer any additional treatment for the time being. We will continue Lupron at this time.  2. Bone directed therapy: Complications such as hypokalemia and osteonecrosis associated of the jaw associated with Delton See were reviewed again and he is agreeable to continue.  3. AVR s/p mechanical valve replacement: Anticoagulation resumed with Coumadin. No bleeding complications noted.  4. Anemia: His hemoglobin remains relatively stable at this time.  5. Follow-up: Will be in 3 months for Lupron and  Xgeva injections.   [facility], MD 05/11/17

## 2017-05-11 NOTE — Patient Instructions (Signed)
Denosumab injection What is this medicine? DENOSUMAB (den oh sue mab) slows bone breakdown. Prolia is used to treat osteoporosis in women after menopause and in men. Delton See is used to treat a high calcium level due to cancer and to prevent bone fractures and other bone problems caused by multiple myeloma or cancer bone metastases. Delton See is also used to treat giant cell tumor of the bone. This medicine may be used for other purposes; ask your health care provider or pharmacist if you have questions. COMMON BRAND NAME(S): Prolia, XGEVA What should I tell my health care provider before I take this medicine? They need to know if you have any of these conditions: -dental disease -having surgery or tooth extraction -infection -kidney disease -low levels of calcium or Vitamin D in the blood -malnutrition -on hemodialysis -skin conditions or sensitivity -thyroid or parathyroid disease -an unusual reaction to denosumab, other medicines, foods, dyes, or preservatives -pregnant or trying to get pregnant -breast-feeding How should I use this medicine? This medicine is for injection under the skin. It is given by a health care professional in a hospital or clinic setting. If you are getting Prolia, a special MedGuide will be given to you by the pharmacist with each prescription and refill. Be sure to read this information carefully each time. For Prolia, talk to your pediatrician regarding the use of this medicine in children. Special care may be needed. For Delton See, talk to your pediatrician regarding the use of this medicine in children. While this drug may be prescribed for children as young as 13 years for selected conditions, precautions do apply. Overdosage: If you think you have taken too much of this medicine contact a poison control center or emergency room at once. NOTE: This medicine is only for you. Do not share this medicine with others. What if I miss a dose? It is important not to miss your  dose. Call your doctor or health care professional if you are unable to keep an appointment. What may interact with this medicine? Do not take this medicine with any of the following medications: -other medicines containing denosumab This medicine may also interact with the following medications: -medicines that lower your chance of fighting infection -steroid medicines like prednisone or cortisone This list may not describe all possible interactions. Give your health care provider a list of all the medicines, herbs, non-prescription drugs, or dietary supplements you use. Also tell them if you smoke, drink alcohol, or use illegal drugs. Some items may interact with your medicine. What should I watch for while using this medicine? Visit your doctor or health care professional for regular checks on your progress. Your doctor or health care professional may order blood tests and other tests to see how you are doing. Call your doctor or health care professional for advice if you get a fever, chills or sore throat, or other symptoms of a cold or flu. Do not treat yourself. This drug may decrease your body's ability to fight infection. Try to avoid being around people who are sick. You should make sure you get enough calcium and vitamin D while you are taking this medicine, unless your doctor tells you not to. Discuss the foods you eat and the vitamins you take with your health care professional. See your dentist regularly. Brush and floss your teeth as directed. Before you have any dental work done, tell your dentist you are receiving this medicine. Do not become pregnant while taking this medicine or for 5 months after stopping  it. Talk with your doctor or health care professional about your birth control options while taking this medicine. Women should inform their doctor if they wish to become pregnant or think they might be pregnant. There is a potential for serious side effects to an unborn child. Talk  to your health care professional or pharmacist for more information. What side effects may I notice from receiving this medicine? Side effects that you should report to your doctor or health care professional as soon as possible: -allergic reactions like skin rash, itching or hives, swelling of the face, lips, or tongue -bone pain -breathing problems -dizziness -jaw pain, especially after dental work -redness, blistering, peeling of the skin -signs and symptoms of infection like fever or chills; cough; sore throat; pain or trouble passing urine -signs of low calcium like fast heartbeat, muscle cramps or muscle pain; pain, tingling, numbness in the hands or feet; seizures -unusual bleeding or bruising -unusually weak or tired Side effects that usually do not require medical attention (report to your doctor or health care professional if they continue or are bothersome): -constipation -diarrhea -headache -joint pain -loss of appetite -muscle pain -runny nose -tiredness -upset stomach This list may not describe all possible side effects. Call your doctor for medical advice about side effects. You may report side effects to FDA at 1-800-FDA-1088. Where should I keep my medicine? This medicine is only given in a clinic, doctor's office, or other health care setting and will not be stored at home. NOTE: This sheet is a summary. It may not cover all possible information. If you have questions about this medicine, talk to your doctor, pharmacist, or health care provider.  2018 Elsevier/Gold Standard (2016-11-17 19:17:21) Leuprolide injection What is this medicine? LEUPROLIDE (loo PROE lide) is a man-made hormone. It is used to treat the symptoms of prostate cancer. This medicine may also be used to treat children with early onset of puberty. It may be used for other hormonal conditions. This medicine may be used for other purposes; ask your health care provider or pharmacist if you have  questions. COMMON BRAND NAME(S): Lupron What should I tell my health care provider before I take this medicine? They need to know if you have any of these conditions: -diabetes -heart disease or previous heart attack -high blood pressure -high cholesterol -pain or difficulty passing urine -spinal cord metastasis -stroke -tobacco smoker -an unusual or allergic reaction to leuprolide, benzyl alcohol, other medicines, foods, dyes, or preservatives -pregnant or trying to get pregnant -breast-feeding How should I use this medicine? This medicine is for injection under the skin or into a muscle. You will be taught how to prepare and give this medicine. Use exactly as directed. Take your medicine at regular intervals. Do not take your medicine more often than directed. It is important that you put your used needles and syringes in a special sharps container. Do not put them in a trash can. If you do not have a sharps container, call your pharmacist or healthcare provider to get one. A special MedGuide will be given to you by the pharmacist with each prescription and refill. Be sure to read this information carefully each time. Talk to your pediatrician regarding the use of this medicine in children. While this medicine may be prescribed for children as young as 8 years for selected conditions, precautions do apply. Overdosage: If you think you have taken too much of this medicine contact a poison control center or emergency room at once. NOTE: This   medicine is only for you. Do not share this medicine with others. What if I miss a dose? If you miss a dose, take it as soon as you can. If it is almost time for your next dose, take only that dose. Do not take double or extra doses. What may interact with this medicine? Do not take this medicine with any of the following medications: -chasteberry This medicine may also interact with the following medications: -herbal or dietary supplements, like  black cohosh or DHEA -male hormones, like estrogens or progestins and birth control pills, patches, rings, or injections -male hormones, like testosterone This list may not describe all possible interactions. Give your health care provider a list of all the medicines, herbs, non-prescription drugs, or dietary supplements you use. Also tell them if you smoke, drink alcohol, or use illegal drugs. Some items may interact with your medicine. What should I watch for while using this medicine? Visit your doctor or health care professional for regular checks on your progress. During the first week, your symptoms may get worse, but then will improve as you continue your treatment. You may get hot flashes, increased bone pain, increased difficulty passing urine, or an aggravation of nerve symptoms. Discuss these effects with your doctor or health care professional, some of them may improve with continued use of this medicine. Male patients may experience a menstrual cycle or spotting during the first 2 months of therapy with this medicine. If this continues, contact your doctor or health care professional. What side effects may I notice from receiving this medicine? Side effects that you should report to your doctor or health care professional as soon as possible: -allergic reactions like skin rash, itching or hives, swelling of the face, lips, or tongue -breathing problems -chest pain -depression or memory disorders -pain in your legs or groin -pain at site where injected -severe headache -swelling of the feet and legs -visual changes -vomiting Side effects that usually do not require medical attention (report to your doctor or health care professional if they continue or are bothersome): -breast swelling or tenderness -decrease in sex drive or performance -diarrhea -hot flashes -loss of appetite -muscle, joint, or bone pains -nausea -redness or irritation at site where injected -skin  problems or acne This list may not describe all possible side effects. Call your doctor for medical advice about side effects. You may report side effects to FDA at 1-800-FDA-1088. Where should I keep my medicine? Keep out of the reach of children. Store below 25 degrees C (77 degrees F). Do not freeze. Protect from light. Do not use if it is not clear or if there are particles present. Throw away any unused medicine after the expiration date. NOTE: This sheet is a summary. It may not cover all possible information. If you have questions about this medicine, talk to your doctor, pharmacist, or health care provider.  2018 Elsevier/Gold Standard (2016-06-15 10:54:35)  

## 2017-05-11 NOTE — Telephone Encounter (Signed)
Scheduled appt per 7/3 los - Gave patient AVS and calender per los.  

## 2017-05-12 LAB — PSA: PROSTATE SPECIFIC AG, SERUM: 497.2 ng/mL — AB (ref 0.0–4.0)

## 2017-06-22 ENCOUNTER — Telehealth: Payer: Self-pay

## 2017-06-22 NOTE — Telephone Encounter (Signed)
Pt has developed increased pain for about a week. Pain is in his back and buttocks and underneath right arm. Under arm is sharp and sudden then goes away. his wife cannot tell if his back pain constant or not.  Pain will affect his appetite so he does not eat. Wife gives him tylenol about 3 times a day. His not wanting to eat bothers wife more than his having pain.

## 2017-06-23 ENCOUNTER — Telehealth: Payer: Self-pay

## 2017-06-23 MED ORDER — TRAMADOL HCL 50 MG PO TABS: 50.0000 mg | ORAL_TABLET | Freq: Four times a day (QID) | ORAL | 0 refills | Status: AC | PRN

## 2017-06-23 MED ORDER — MEGESTROL ACETATE 400 MG/10ML PO SUSP: 400.0000 mg | Freq: Two times a day (BID) | ORAL | 0 refills | Status: AC

## 2017-06-23 NOTE — Telephone Encounter (Signed)
Wife called today. Her phone was off the hook yesterday and she was not sure if she missed a call. He has eaten 1 bite of bacon/egg/cheese bisquit, doctor pepper and glass of OJ. His pain is unchanged.   Tramadol 50 q 6 prn #30 Megace Suspension  400/ 10 cc  Take 10 cc bid #240

## 2017-08-12 ENCOUNTER — Ambulatory Visit: Payer: Medicare HMO

## 2017-08-12 ENCOUNTER — Ambulatory Visit (HOSPITAL_BASED_OUTPATIENT_CLINIC_OR_DEPARTMENT_OTHER): Payer: Medicare HMO | Admitting: Oncology

## 2017-08-12 ENCOUNTER — Other Ambulatory Visit (HOSPITAL_BASED_OUTPATIENT_CLINIC_OR_DEPARTMENT_OTHER): Payer: Medicare HMO

## 2017-08-12 VITALS — BP 94/52 | HR 79 | Temp 97.5°F | Resp 18 | Ht 72.0 in

## 2017-08-12 DIAGNOSIS — C7951 Secondary malignant neoplasm of bone: Secondary | ICD-10-CM

## 2017-08-12 DIAGNOSIS — D630 Anemia in neoplastic disease: Secondary | ICD-10-CM

## 2017-08-12 DIAGNOSIS — C61 Malignant neoplasm of prostate: Secondary | ICD-10-CM | POA: Diagnosis not present

## 2017-08-12 LAB — CBC WITH DIFFERENTIAL/PLATELET
BASO%: 0.2 % (ref 0.0–2.0)
Basophils Absolute: 0 10*3/uL (ref 0.0–0.1)
EOS ABS: 0.2 10*3/uL (ref 0.0–0.5)
EOS%: 3 % (ref 0.0–7.0)
HCT: 26.6 % — ABNORMAL LOW (ref 38.4–49.9)
HEMOGLOBIN: 8.2 g/dL — AB (ref 13.0–17.1)
LYMPH%: 18.5 % (ref 14.0–49.0)
MCH: 27.6 pg (ref 27.2–33.4)
MCHC: 30.8 g/dL — ABNORMAL LOW (ref 32.0–36.0)
MCV: 89.6 fL (ref 79.3–98.0)
MONO#: 0.8 10*3/uL (ref 0.1–0.9)
MONO%: 9.8 % (ref 0.0–14.0)
NEUT%: 68.5 % (ref 39.0–75.0)
NEUTROS ABS: 5.5 10*3/uL (ref 1.5–6.5)
Platelets: 172 10*3/uL (ref 140–400)
RBC: 2.97 10*6/uL — AB (ref 4.20–5.82)
RDW: 17.2 % — AB (ref 11.0–14.6)
WBC: 8.1 10*3/uL (ref 4.0–10.3)
lymph#: 1.5 10*3/uL (ref 0.9–3.3)

## 2017-08-12 LAB — COMPREHENSIVE METABOLIC PANEL
ALT: 13 U/L (ref 0–55)
ANION GAP: 10 meq/L (ref 3–11)
AST: 32 U/L (ref 5–34)
Albumin: 2.7 g/dL — ABNORMAL LOW (ref 3.5–5.0)
BILIRUBIN TOTAL: 0.63 mg/dL (ref 0.20–1.20)
BUN: 29.3 mg/dL — ABNORMAL HIGH (ref 7.0–26.0)
CO2: 21 meq/L — AB (ref 22–29)
Calcium: 8.9 mg/dL (ref 8.4–10.4)
Chloride: 103 mEq/L (ref 98–109)
Creatinine: 1.4 mg/dL — ABNORMAL HIGH (ref 0.7–1.3)
EGFR: 43 mL/min/{1.73_m2} — AB (ref >90)
GLUCOSE: 115 mg/dL (ref 70–140)
POTASSIUM: 4.9 meq/L (ref 3.5–5.1)
SODIUM: 134 meq/L — AB (ref 136–145)
TOTAL PROTEIN: 6.5 g/dL (ref 6.4–8.3)

## 2017-08-12 LAB — TECHNOLOGIST REVIEW

## 2017-08-12 MED ORDER — ONDANSETRON 4 MG PO TBDP: 4.0000 mg | ORAL_TABLET | Freq: Three times a day (TID) | ORAL | 0 refills | Status: AC | PRN

## 2017-08-12 NOTE — Progress Notes (Signed)
[facility] PROGRESS NOTE 08/12/17   Clayton [name]., MD [place] [address]  DIAGNOSIS: 83 year old man with hormone sensitive metastatic prostate cancer diagnosed in June 2015. He presented with bony metastasis and a PSA of 70.23.   CURRENT TREATMENT:   Lupron 22.5 mg q 3 months started on 06/05/2014. This will be given every 3 months. Completed one month of casodex 50 mg daily on 06/29/2014.  Xgeva given in September 2015. This will be resumed on May 2017.  INTERVAL HISTORY: Mr. Sobek presents today for a follow-up visit with his family.  Since the last visit, he reports continuous decline in his health. Per his family, he is eating last and have continues to lose weight and overall more fatigued. He is complaining of generalized weakness and his performance status which is limited to start with have continued to decline. He is reporting diffuse pain which is chronic in nature but does not appear to have changed dramatically. He denied any falls or syncope. He denied any pathological fractures. He denied any recent hospitalizations.   He does not report any headaches or blurry vision or syncope. He does not report any fevers, chills, sweats or weight loss. He does not report any chest pain, palpitation orthopnea or dyspnea. He does not report any cough or hemoptysis or wheezing. Does not report any nausea, vomiting, abdominal pain. He does not report any constipation or diarrhea. Does not report any hematuria but does report nocturia and frequency. Rest of his review of systems unremarkable.  MEDICAL HISTORY: Past Medical History:  Diagnosis Date  . Aortic aneurysm (HCC)    at least 3 cmm infrarenal AAA by lumbar CT 05/22/13 [facility]); 41 mm proximal ascending aorta on echo 05/05/13 [facility])  . Arthritis   . CAD (coronary artery disease)    s/p CABG 1999  . GERD (gastroesophageal reflux disease)   . H/O hiatal hernia   .  HLD (hyperlipidemia)   . HTN (hypertension)   . Peripheral vascular disease ([place])    aneursym     ALLERGIES:  is allergic to tetanus toxoids.  MEDICATIONS:  Current Outpatient Prescriptions  Medication Sig Dispense Refill  . acetaminophen (TYLENOL) 325 MG tablet Take 2 tablets (650 mg total) by mouth every 6 (six) hours as needed for mild pain (or Fever >/= 101).    Marland Kitchen aspirin EC 81 MG tablet Take 81 mg by mouth daily.    . cyanocobalamin (,VITAMIN B-12,) 1000 MCG/ML injection Inject 1 mL (1,000 mcg total) into the muscle every 30 (thirty) days. 1000 mcg IM daily for 5 days (received 7/7-7/11), then weekly for 3 weeks (first dose due 7/18), then monthly 1 mL 0  . donepezil (ARICEPT) 5 MG tablet Take 5 mg by mouth at bedtime.     Marland Kitchen latanoprost (XALATAN) 0.005 % ophthalmic solution Place 1 drop into both eyes at bedtime.     Marland Kitchen leuprolide (LUPRON) 22.5 MG injection Inject 22.5 mg into the muscle every 3 (three) months.    Marland Kitchen lisinopril (PRINIVIL,ZESTRIL) 10 MG tablet Take 1 tablet by mouth daily.    . megestrol (MEGACE) 400 MG/10ML suspension Take 10 mLs (400 mg total) by mouth 2 (two) times daily. For appetite stimulation 240 mL 0  . metoprolol (LOPRESSOR) 100 MG tablet Take 100 mg by mouth daily.     . metoprolol (LOPRESSOR) 50 MG tablet Take 50 mg by mouth at bedtime.    . multivitamin-lutein (OCUVITE-LUTEIN) CAPS Take  1 capsule by mouth 2 (two) times daily.      Marland Kitchen omeprazole (PRILOSEC) 20 MG capsule Take 20 mg by mouth daily.     . Tamsulosin HCl (FLOMAX) 0.4 MG CAPS Take 0.4 mg by mouth at bedtime.     . traMADol (ULTRAM) 50 MG tablet Take 1 tablet (50 mg total) by mouth every 6 (six) hours as needed for severe pain. 30 tablet 0  . warfarin (COUMADIN) 1 MG tablet Take 5-7 tablets (5-7 mg total) by mouth daily. Pt takes  5 mg every day except on Sunday Thursday and Saturday take  two of the 1 mg tablets to equal a total of 7 mg -Start first dose on 1/8/oh thousand 18, as patient received  warfarin in hospital today. To discharge     No current facility-administered medications for this visit.     ECOG PERFORMANCE STATUS:  3  Blood pressure (!) 94/52, pulse 79, temperature (!) 97.5 F (36.4 C), temperature source Oral, resp. rate 18, height 6' (1.829 m), SpO2 96 %.  GENERAL: Chronically ill-appearing elderly gentleman without distress. SKIN: No skin rashes or lesions. EYES: Sclerae are anicteric. OROPHARYNX:. No oral thrush or ulcers. NECK: No thyroid masses or lymphadenopathy. LYMPH: no palpable lymphadenopathy in the cervical, axillary or inguinal  LUNGS: clear to auscultation and percussion with normal breathing effort  HEART: regular rate & rhythm and no murmurs and no lower extremity edema  ABDOMEN:abdomen soft, non-tender and normal bowel sounds. No shifting dullness or ascites. Musculoskeletal:no cyanosis of digits and no clubbing  NEURO: No focal deficits noted.  Labs:  Lab Results  Component Value Date   WBC 8.1 08/12/2017   HGB 8.2 (L) 08/12/2017   HCT 26.6 (L) 08/12/2017   MCV 89.6 08/12/2017   PLT 172 08/12/2017   NEUTROABS 5.5 08/12/2017      Chemistry      Component Value Date/Time   NA 140 05/11/2017 1422   K 4.8 05/11/2017 1422   CL 109 11/13/2016 0100   CO2 28 05/11/2017 1422   BUN 23.0 05/11/2017 1422   CREATININE 1.3 05/11/2017 1422      Component Value Date/Time   CALCIUM 8.9 05/11/2017 1422   ALKPHOS 1,763 (H) 05/11/2017 1422   AST 22 05/11/2017 1422   ALT 9 05/11/2017 1422   BILITOT 0.73 05/11/2017 1422     Results for Clayton [name] (MRN [id number]) as of 08/12/2017 15:23  Ref. Range 01/07/2017 14:55 05/11/2017 14:22  Prostate Specific Ag, Serum Latest Ref Range: 0.0 - 4.0 ng/mL 69.1 (H) 497.2 (H)       ASSESSMENT AND PLAN : [name] 83 y.o. male with:    1. Metastatic Prostate cancer. His CT scan in February 2016 showed metastatic disease to the bone but no visceral metastasis. 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  He is currently on Lupron and have tolerated it well. His PSA initially declined to 1.14.   He has developed castration resistant disease with a PSA rise rapidly now to 500. He is clinically declining with decreased oral intake and generalized weakness.  The natural course of this disease was discussed today with his son. Treatment options were reviewed and they are limited. Given his age, debilitation and limited performance status, I have recommended hospice care. He is not a candidate for different salvage therapy at this time.  His son will discuss these findings with the rest of the family today and let us  know in the immediate future. My recommendation is to proceed with hospice as soon as possible.  I will discontinue Lupron at this time.  2. Bone directed therapy: I will discontinue Xgeva given his progression of disease and impending enrollment in hospice.  3. AVR s/p mechanical valve replacement: Anticoagulation resumed with Coumadin. No bleeding complications noted.  4. Anemia: His hemoglobin is declining. This is related to chronic disease and malignancy.  5. Prognosis: Poor with limited life expectancy. He is hospice eligible at this time  6. Follow-up: Will be as needed given his impending hospice enrollment.   Zola Button, MD 08/12/17

## 2017-08-13 ENCOUNTER — Telehealth: Payer: Self-pay | Admitting: *Deleted

## 2017-08-13 LAB — PSA: PROSTATE SPECIFIC AG, SERUM: 861.3 ng/mL — AB (ref 0.0–4.0)

## 2017-08-13 NOTE — Telephone Encounter (Signed)
Per Dr. Alen Blew, I made the referral to [facility]. Faxed referral to [phone].

## 2017-08-13 NOTE — Telephone Encounter (Signed)
Received voice mail message from wife stating,"We have decided to do Hospice. I need someone to come out today if possible. My return number is [phone]."

## 2017-09-09 DEATH — deceased

## 2017-10-13 ENCOUNTER — Other Ambulatory Visit: Payer: Self-pay | Admitting: Nurse Practitioner
